# Patient Record
Sex: Male | Born: 1940 | Race: White | Hispanic: No | Marital: Single | Smoking: Never smoker
Health system: Southern US, Community
[De-identification: ages and names within clinical notes are randomized; demographics above are authoritative.]

## PROBLEM LIST (undated history)

## (undated) DIAGNOSIS — R519 Headache, unspecified: Secondary | ICD-10-CM

## (undated) DIAGNOSIS — R339 Retention of urine, unspecified: Secondary | ICD-10-CM

## (undated) DIAGNOSIS — N3281 Overactive bladder: Secondary | ICD-10-CM

## (undated) DIAGNOSIS — N319 Neuromuscular dysfunction of bladder, unspecified: Secondary | ICD-10-CM

## (undated) DIAGNOSIS — R1310 Dysphagia, unspecified: Secondary | ICD-10-CM

## (undated) DIAGNOSIS — R32 Unspecified urinary incontinence: Secondary | ICD-10-CM

## (undated) DIAGNOSIS — J449 Chronic obstructive pulmonary disease, unspecified: Secondary | ICD-10-CM

## (undated) DIAGNOSIS — F329 Major depressive disorder, single episode, unspecified: Secondary | ICD-10-CM

## (undated) DIAGNOSIS — F32A Depression, unspecified: Secondary | ICD-10-CM

## (undated) DIAGNOSIS — E039 Hypothyroidism, unspecified: Secondary | ICD-10-CM

## (undated) DIAGNOSIS — N400 Enlarged prostate without lower urinary tract symptoms: Secondary | ICD-10-CM

## (undated) DIAGNOSIS — E46 Unspecified protein-calorie malnutrition: Secondary | ICD-10-CM

## (undated) DIAGNOSIS — S73035A Other anterior dislocation of left hip, initial encounter: Secondary | ICD-10-CM

## (undated) DIAGNOSIS — K5900 Constipation, unspecified: Secondary | ICD-10-CM

## (undated) DIAGNOSIS — A809 Acute poliomyelitis, unspecified: Secondary | ICD-10-CM

## (undated) DIAGNOSIS — K219 Gastro-esophageal reflux disease without esophagitis: Secondary | ICD-10-CM

## (undated) DIAGNOSIS — E876 Hypokalemia: Secondary | ICD-10-CM

## (undated) DIAGNOSIS — R51 Headache: Secondary | ICD-10-CM

## (undated) DIAGNOSIS — E785 Hyperlipidemia, unspecified: Secondary | ICD-10-CM

## (undated) DIAGNOSIS — Z9109 Other allergy status, other than to drugs and biological substances: Secondary | ICD-10-CM

## (undated) DIAGNOSIS — F419 Anxiety disorder, unspecified: Secondary | ICD-10-CM

## (undated) DIAGNOSIS — F79 Unspecified intellectual disabilities: Secondary | ICD-10-CM

## (undated) DIAGNOSIS — G9340 Encephalopathy, unspecified: Secondary | ICD-10-CM

## (undated) DIAGNOSIS — E559 Vitamin D deficiency, unspecified: Secondary | ICD-10-CM

## (undated) DIAGNOSIS — J309 Allergic rhinitis, unspecified: Secondary | ICD-10-CM

## (undated) DIAGNOSIS — D509 Iron deficiency anemia, unspecified: Secondary | ICD-10-CM

## (undated) DIAGNOSIS — Z96642 Presence of left artificial hip joint: Secondary | ICD-10-CM

## (undated) HISTORY — PX: CHOLECYSTECTOMY: SHX55

## (undated) HISTORY — PX: JOINT REPLACEMENT: SHX530

## (undated) HISTORY — PX: WOUND DEBRIDEMENT: SHX247

---

## 2002-01-29 ENCOUNTER — Ambulatory Visit (HOSPITAL_COMMUNITY): Admission: RE | Admit: 2002-01-29 | Discharge: 2002-01-29 | Payer: Self-pay | Admitting: Nephrology

## 2003-02-09 ENCOUNTER — Emergency Department (HOSPITAL_COMMUNITY): Admission: EM | Admit: 2003-02-09 | Discharge: 2003-02-09 | Payer: Self-pay | Admitting: Emergency Medicine

## 2006-09-25 ENCOUNTER — Ambulatory Visit (HOSPITAL_COMMUNITY): Admission: RE | Admit: 2006-09-25 | Discharge: 2006-09-25 | Payer: Self-pay | Admitting: Family Medicine

## 2007-12-10 ENCOUNTER — Emergency Department (HOSPITAL_COMMUNITY): Admission: EM | Admit: 2007-12-10 | Discharge: 2007-12-10 | Payer: Self-pay | Admitting: Emergency Medicine

## 2008-04-12 ENCOUNTER — Ambulatory Visit (HOSPITAL_COMMUNITY): Admission: RE | Admit: 2008-04-12 | Discharge: 2008-04-12 | Payer: Self-pay | Admitting: Family Medicine

## 2008-04-12 ENCOUNTER — Encounter: Payer: Self-pay | Admitting: Orthopedic Surgery

## 2008-05-21 ENCOUNTER — Ambulatory Visit (HOSPITAL_COMMUNITY): Admission: RE | Admit: 2008-05-21 | Discharge: 2008-05-21 | Payer: Self-pay | Admitting: Family Medicine

## 2008-06-17 ENCOUNTER — Ambulatory Visit: Payer: Self-pay | Admitting: Orthopedic Surgery

## 2008-06-17 ENCOUNTER — Ambulatory Visit (HOSPITAL_COMMUNITY): Admission: RE | Admit: 2008-06-17 | Discharge: 2008-06-17 | Payer: Self-pay | Admitting: Orthopedic Surgery

## 2008-06-17 DIAGNOSIS — M169 Osteoarthritis of hip, unspecified: Secondary | ICD-10-CM | POA: Insufficient documentation

## 2008-06-17 DIAGNOSIS — M25559 Pain in unspecified hip: Secondary | ICD-10-CM | POA: Insufficient documentation

## 2008-06-17 DIAGNOSIS — M1610 Unilateral primary osteoarthritis, unspecified hip: Secondary | ICD-10-CM | POA: Insufficient documentation

## 2008-06-30 ENCOUNTER — Encounter: Payer: Self-pay | Admitting: Orthopedic Surgery

## 2008-07-08 ENCOUNTER — Encounter: Payer: Self-pay | Admitting: Orthopedic Surgery

## 2008-08-11 ENCOUNTER — Encounter (HOSPITAL_COMMUNITY): Admission: RE | Admit: 2008-08-11 | Discharge: 2008-09-10 | Payer: Self-pay | Admitting: Family Medicine

## 2009-04-13 ENCOUNTER — Encounter: Payer: Self-pay | Admitting: Orthopedic Surgery

## 2009-04-13 ENCOUNTER — Ambulatory Visit (HOSPITAL_COMMUNITY): Admission: RE | Admit: 2009-04-13 | Discharge: 2009-04-13 | Payer: Self-pay | Admitting: Family Medicine

## 2009-05-21 ENCOUNTER — Encounter: Payer: Self-pay | Admitting: Orthopedic Surgery

## 2009-05-25 ENCOUNTER — Encounter: Payer: Self-pay | Admitting: Orthopedic Surgery

## 2009-05-31 ENCOUNTER — Ambulatory Visit: Payer: Self-pay | Admitting: Orthopedic Surgery

## 2009-05-31 DIAGNOSIS — M4800 Spinal stenosis, site unspecified: Secondary | ICD-10-CM

## 2009-05-31 DIAGNOSIS — M1710 Unilateral primary osteoarthritis, unspecified knee: Secondary | ICD-10-CM

## 2009-05-31 DIAGNOSIS — M1610 Unilateral primary osteoarthritis, unspecified hip: Secondary | ICD-10-CM | POA: Insufficient documentation

## 2009-06-01 ENCOUNTER — Encounter: Payer: Self-pay | Admitting: Orthopedic Surgery

## 2009-06-02 ENCOUNTER — Telehealth: Payer: Self-pay | Admitting: Orthopedic Surgery

## 2009-06-07 ENCOUNTER — Telehealth: Payer: Self-pay | Admitting: Orthopedic Surgery

## 2009-07-15 ENCOUNTER — Encounter: Payer: Self-pay | Admitting: Orthopedic Surgery

## 2009-07-19 ENCOUNTER — Ambulatory Visit: Payer: Self-pay | Admitting: Orthopedic Surgery

## 2009-07-22 ENCOUNTER — Telehealth: Payer: Self-pay | Admitting: Orthopedic Surgery

## 2009-09-30 ENCOUNTER — Observation Stay (HOSPITAL_COMMUNITY): Admission: EM | Admit: 2009-09-30 | Discharge: 2009-10-04 | Payer: Self-pay | Admitting: Emergency Medicine

## 2010-02-14 ENCOUNTER — Telehealth: Payer: Self-pay | Admitting: Orthopedic Surgery

## 2010-04-09 ENCOUNTER — Encounter: Payer: Self-pay | Admitting: Family Medicine

## 2010-04-17 ENCOUNTER — Encounter: Payer: Self-pay | Admitting: Orthopedic Surgery

## 2010-04-18 NOTE — Progress Notes (Signed)
Summary: Referral to Dr. Ace Gins.  Phone Note Outgoing Call   Call placed by: Santo Held,  June 02, 2009 2:40 PM Call placed to: Specialist Action Taken: Information Sent Summary of Call: I faxed a referral for this patient to Dr. Ace Gins for ESI injections at L4-5.

## 2010-04-18 NOTE — Letter (Signed)
Summary: *Orthopedic Consult Note  [facility]  [address] [place] [address]  [place], [place] [address]   Phone: [phone]  Fax: [phone]    Re:    Alexander Hanna DOB:    1940/11/28   Dear: Dr. Lorriane Shire   Thank you for requesting that we see the above patient for consultation.  A copy of the detailed office note will be sent under separate cover, for your review.  Evaluation today is consistent with:  1)  SPINAL STENOSIS (ICD-724.00) 2)  ARTHRITIS, RIGHT HIP (ICD-716.95) 3)  KNEE, ARTHRITIS, DEGEN./OSTEO [id number])   [name] has arthritis of the knee hip and spinal stenosis.  The spinal stenosis is causing the giving way symptoms of the RIGHT leg.  The RIGHT hip and RIGHT knee are stable and did not need any surgical treatment at this time.  Although his been well cared for his mental capacity prevents him from being a surgical candidate for hip or knee replacement in the future.  He has been sent for epidural injections for the spine.      Thank you for this opportunity to look after your patient.  Sincerely,   Demetrius Revel. MD.

## 2010-04-18 NOTE — Assessment & Plan Note (Signed)
Summary: hip pain needs xr/medicare/medicaid/[place]ie[place]/bsf   Vital Signs:  Patient profile:   70 year old male Height:      64 inches Weight:      221 pounds Pulse rate:   82 / minute Resp:     18 per minute  Visit Type:  Initial Consult Referring Provider:  Dr. Cindie Laroche Primary Provider:  Dr. Cindie Laroche  CC:  right hip pain.  History of Present Illness: Alexander Hanna is 70 years old history of giving out symptoms of the RIGHT lower extremity is referred for RIGHT hip pain but his symptoms are that his leg gives out when his walking he's had frequent falls.  We have an MRI from January 6 of 2011 and he has spinal stenosis is significant at L4 and 5 and L5 and S1 we also have x-rays from 06/17/08 osteoarthritis RIGHT hip, moderate.  We also have x-ray 04/12/08 and minimal arthritis RIGHT knee.  He had an MRI during the same time and that showed no intra-articular pathology other than some mild arthritic changes with chondromalacia.  He does have some stiffness and pain in the RIGHT knee it is mild pain is medial.  He denies hip pain he has trouble getting up from a chair.  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   Meds: Avodart, Flomax, Levoxyl, Naproxen, Simcor, Zetia, Loratadine, Fluticasone.    Allergies: No Known Drug Allergies  Past History:  Past Medical History: brain damage, had polio at 70 yrs old. seasonal allergies thyroid cholesterol hard of hearing tunnel vision htn  Family History: FH of Cancer:  Family History Coronary Heart Disease male < 20 Family History of Arthritis Hx, family, chronic respiratory condition Family History Coronary Heart Disease male < 102  Social History: Patient is single.  no job n smoking no alcohol 3 cups of coffee  Review of Systems Constitutional:  Denies weight loss, weight gain, fever, chills, and fatigue. Cardiovascular:  Denies chest pain, palpitations,  fainting, and murmurs. Respiratory:  Complains of short of breath and snoring; denies wheezing, couch, tightness, pain on inspiration, and snoring . Gastrointestinal:  Denies heartburn, nausea, vomiting, diarrhea, constipation, and blood in your stools. Genitourinary:  Complains of frequency, urgency, and difficulty urinating; denies painful urination, flank pain, and bleeding in urine. Neurologic:  Complains of unsteady gait and dizziness; denies numbness, tingling, tremors, and seizure. Musculoskeletal:  Complains of joint pain, instability, and stiffness; denies swelling, redness, heat, and muscle pain. Endocrine:  Denies excessive thirst, exessive urination, and heat or cold intolerance. Psychiatric:  Complains of nervousness and anxiety; denies depression and hallucinations. Skin:  Denies changes in the skin, poor healing, rash, itching, and redness. HEENT:  Complains of blurred or double vision; denies eye pain, redness, and watering; headache, difficult swallowing, ears ringing, tunnel vision. Immunology:  Complains of seasonal allergies; denies sinus problems. Hemoatologic:  Denies easy bleeding and brusing.  Physical Exam  Additional Exam:  vital signs are as recorded  His overall appearance is normal.  He uses a cane to ambulate.  Peripheral vascular system reveals no swelling normal pulses and temperature  Lymph node exams deferred  He walks with a cane as stated he has leg length discrepancy LEFT longer than RIGHT  Inspection reveals tenderness on the medial joint line it is mild is no effusion his range of motion 125 with no contracture, mild crepitation  The joint is assessed a stable and the muscle strength and  tone is assessed as normal, meniscal signs are negative,  RIGHT hip flexion normal no pain no pain with internal rotation  Skin RIGHT leg normal  Reflexes remain intact has normal soft touch sensation to the RIGHT leg he is oriented to time person place but he  does have a mental deficiency and is here with her caregiver who spent very diligent about his care.     Impression & Recommendations: 1. he has osteoarthritis of the knee mild  2.  he has moderate RIGHT hip arthritis  3.  he has degenerative disc disease with spinal stenosis of the RIGHT lower extremity which is the primary cause of the leg giving way.  I do not think is a surgical candidate.  I discussed this at length with his caregiver.  I recommended an epidural injection.  We talked about spine referral but I doubt he would be an operative candidate.  He is not an operative candidate for his mild knee arthritis or his moderate hip arthritis  Other Orders: Est. Patient Level IV [id number])  Patient Instructions: 1)  ESI injections at L4-5 2)  Call us after the 2nd injection

## 2010-04-18 NOTE — Progress Notes (Signed)
Summary: medical record request per patient POA  Phone Note Call from Patient   Caller: Patient's brother Summary of Call: Patient's brother,POA, Alexander Hanna, relates that the second opinion referral has never been scheduled at [facility].  We had followed up previously.  He now requests records to be directly faxed to attn of Dr Margot Chimes per Dr Aline Brochure and per Dr Cindie Laroche.  Authorization has been signed.    Records faxed as requested to # [phone] Initial call taken by: Ihor Austin,  February 14, 2010 4:16 PM

## 2010-04-18 NOTE — Progress Notes (Signed)
Summary: Referral to [facility].  Phone Note Outgoing Call   Call placed by: Santo Held,  Jul 22, 2009 10:22 AM Call placed to: Specialist Action Taken: Information Sent Summary of Call: Referral to [facility] for hip replacement to see Dr. Alvan Dame or Dr. Maureen Ralphs.

## 2010-04-18 NOTE — Progress Notes (Signed)
Summary: Appointment with Dr. Ace Gins.  Phone Note From Other Clinic   Caller: Referral Coordinator Reason for Call: Schedule Patient Appt Summary of Call: Patient has an appointment with Dr. Ace Gins on 06-09-09 at 2:30. Patient is aware of this appointment. Initial call taken by: Santo Held,  June 07, 2009 8:35 AM

## 2010-04-18 NOTE — Letter (Signed)
Summary: Historic Patient File  Historic Patient File   Imported By: Jeanelle Malling 06/03/2009 11:12:45  _____________________________________________________________________  External Attachment:    Type:   Image     Comment:   history form

## 2010-04-18 NOTE — Letter (Signed)
Summary: ESI referral order  ESI referral order   Imported By: Ihor Austin 07/07/2009 [id number]  _____________________________________________________________________  External Attachment:    Type:   Image     Comment:   External Document

## 2010-04-18 NOTE — Assessment & Plan Note (Signed)
Summary: reck after 2nd injection/medicaare/bsf   Visit Type:  Follow-up Referring Provider:  Dr. Cindie Laroche Primary Provider:  Dr. Cindie Laroche  CC:  back pain.  History of Present Illness: 70 year old male comes in for followup after his second epidural injection.  He has degenerative disc disease in his lumbar spine, osteoarthritis of the RIGHT hip moderate, osteoarthritis RIGHT knee mild.  Imaging includes MRI RIGHT knee  MRI lumbar spine X-ray RIGHT hip X-ray RIGHT knee  His major problem is that his RIGHT leg goes out.  He has minimal hip pain and groin pain,no  thigh pain, mild knee pain as well.  He also has back pain.  He has gotten some relief from the overall RIGHT lower extremity pain but he still has weakness and giving out of the RIGHT leg. he also has difficulty getting out of a chair.       Allergies: No Known Drug Allergies   Impression & Recommendations:  Problem # 1:  SPINAL STENOSIS (ICD-724.00)  This is a very difficult call here.  I do not think he is a candidate for spine surgery although he continues to fall.  He does not have major hip or groin pain.  He has some knee pain but his x-rays are not very significant for major arthritis and his MRI shows no intra-articular lesion in the meniscus or the ligament.  On exam his knee is stable.  I would like a second opinion regarding hip replacement surgery.  Orders: Orthopedic Surgeon Referral (Ortho Surgeon) Est. Patient Level III [phone])  Problem # 2:  ARTHRITIS, RIGHT HIP [id number])  Orders: Orthopedic Surgeon Referral (Ortho Surgeon) Est. Patient Level III [id number])  Problem # 3:  KNEE, ARTHRITIS, DEGEN./OSTEO (ICD-715.96)  His updated medication list for this problem includes:    Adult Aspirin Ec Low Strength 81 Mg Tbec (Aspirin)    Naprosyn 500 Mg Tabs (Naproxen) ..... One by mouth bid    Norco 5-325 Mg Tabs (Hydrocodone-acetaminophen) .Marland Kitchen... 1 by mouth q 4prn pain  Orders: Est. Patient Level  III [id number])  Medications Added to Medication List This Visit: 1)  Norco 5-325 Mg Tabs (Hydrocodone-acetaminophen) .Marland Kitchen.. 1 by mouth q 4prn pain  Patient Instructions: 1)  PROCEED WITH 3RD INJECTION  2)  MILD PAIN RELIEVER 3)  CONSULT DR ALUSIO OR OLIN FOR HIP REPLACEMENT  4)  f/u after consult  Prescriptions: NORCO 5-325 MG TABS (HYDROCODONE-ACETAMINOPHEN) 1 by mouth q 4prn pain  #42 x 2   Entered and Authorized by:   Arther Abbott MD   Signed by:   Arther Abbott MD on 07/19/2009   Method used:   Print then Give to Patient   RxID:   [id number]   Appended Document: reck after 2nd injection/medicaare/bsf    Clinical Lists Changes

## 2010-04-26 NOTE — Consult Note (Signed)
Summary: Consult report from Dr. Paralee Cancel  Consult report from Dr. Paralee Cancel   Imported By: Ruffin Pyo 04/17/2010 13:32:26  _____________________________________________________________________  External Attachment:    Type:   Image     Comment:   External Document

## 2010-06-03 LAB — BASIC METABOLIC PANEL
BUN: 11 mg/dL (ref 6–23)
BUN: 11 mg/dL (ref 6–23)
BUN: 12 mg/dL (ref 6–23)
CO2: 25 mEq/L (ref 19–32)
CO2: 26 mEq/L (ref 19–32)
CO2: 28 mEq/L (ref 19–32)
Calcium: 9.1 mg/dL (ref 8.4–10.5)
Chloride: 103 mEq/L (ref 96–112)
Chloride: 104 mEq/L (ref 96–112)
Chloride: 105 mEq/L (ref 96–112)
Chloride: 106 mEq/L (ref 96–112)
Creatinine, Ser: 0.75 mg/dL (ref 0.4–1.5)
Creatinine, Ser: 0.79 mg/dL (ref 0.4–1.5)
GFR calc Af Amer: >60 mL/min (ref >60)
GFR calc Af Amer: >60 mL/min (ref >60)
GFR calc Af Amer: >60 mL/min (ref >60)
GFR calc Af Amer: >60 mL/min (ref >60)
GFR calc non Af Amer: >60 mL/min (ref >60)
GFR calc non Af Amer: >60 mL/min (ref >60)
GFR calc non Af Amer: >60 mL/min (ref >60)
Glucose, Bld: 100 mg/dL — ABNORMAL HIGH (ref 70–99)
Glucose, Bld: 96 mg/dL (ref 70–99)
Potassium: 3.7 mEq/L (ref 3.5–5.1)
Sodium: 135 mEq/L (ref 135–145)

## 2010-06-03 LAB — DIFFERENTIAL
Basophils Absolute: 0 10*3/uL (ref 0.0–0.1)
Basophils Relative: 0 % (ref 0–1)
Basophils Relative: 1 % (ref 0–1)
Eosinophils Absolute: 0.1 10*3/uL (ref 0.0–0.7)
Eosinophils Absolute: 0.3 10*3/uL (ref 0.0–0.7)
Eosinophils Relative: 2 % (ref 0–5)
Eosinophils Relative: 3 % (ref 0–5)
Lymphocytes Relative: 22 % (ref 12–46)
Lymphocytes Relative: 24 % (ref 12–46)
Lymphs Abs: 1.9 10*3/uL (ref 0.7–4.0)
Monocytes Absolute: 0.6 10*3/uL (ref 0.1–1.0)
Monocytes Absolute: 0.7 10*3/uL (ref 0.1–1.0)
Monocytes Absolute: 0.9 10*3/uL (ref 0.1–1.0)
Monocytes Relative: 10 % (ref 3–12)
Monocytes Relative: 9 % (ref 3–12)
Neutro Abs: 3.7 10*3/uL (ref 1.7–7.7)
Neutro Abs: 3.9 10*3/uL (ref 1.7–7.7)
Neutro Abs: 4.7 10*3/uL (ref 1.7–7.7)
Neutro Abs: 6.3 10*3/uL (ref 1.7–7.7)
Neutrophils Relative %: 59 % (ref 43–77)
Neutrophils Relative %: 59 % (ref 43–77)
Neutrophils Relative %: 66 % (ref 43–77)

## 2010-06-03 LAB — CBC
HCT: 38.7 % — ABNORMAL LOW (ref 39.0–52.0)
HCT: 40.3 % (ref 39.0–52.0)
Hemoglobin: 13.4 g/dL (ref 13.0–17.0)
Hemoglobin: 13.7 g/dL (ref 13.0–17.0)
MCH: 30.7 pg (ref 26.0–34.0)
MCH: 30.9 pg (ref 26.0–34.0)
MCH: 31.1 pg (ref 26.0–34.0)
MCHC: 34 g/dL (ref 30.0–36.0)
MCV: 90.3 fL (ref 78.0–100.0)
MCV: 90.6 fL (ref 78.0–100.0)
Platelets: 234 10*3/uL (ref 150–400)
Platelets: 241 10*3/uL (ref 150–400)
Platelets: 255 10*3/uL (ref 150–400)
RBC: 4.28 MIL/uL (ref 4.22–5.81)
RBC: 4.3 MIL/uL (ref 4.22–5.81)
RDW: 14.3 % (ref 11.5–15.5)
RDW: 14.5 % (ref 11.5–15.5)
WBC: 9.6 10*3/uL (ref 4.0–10.5)

## 2010-06-03 LAB — VANCOMYCIN, TROUGH: Vancomycin Tr: 13.9 ug/mL (ref 10.0–20.0)

## 2010-06-07 ENCOUNTER — Other Ambulatory Visit: Payer: Self-pay | Admitting: Orthopedic Surgery

## 2010-06-07 ENCOUNTER — Encounter (HOSPITAL_COMMUNITY): Payer: Medicare Other

## 2010-06-07 LAB — BASIC METABOLIC PANEL
BUN: 9 mg/dL (ref 6–23)
CO2: 26 mEq/L (ref 19–32)
Calcium: 9.4 mg/dL (ref 8.4–10.5)
Chloride: 105 mEq/L (ref 96–112)
GFR calc non Af Amer: >60 mL/min (ref >60)
Sodium: 139 mEq/L (ref 135–145)

## 2010-06-07 LAB — SURGICAL PCR SCREEN
MRSA, PCR: NEGATIVE
Staphylococcus aureus: POSITIVE — AB

## 2010-06-07 LAB — DIFFERENTIAL
Basophils Absolute: 0 10*3/uL (ref 0.0–0.1)
Lymphs Abs: 1.8 10*3/uL (ref 0.7–4.0)
Monocytes Relative: 12 % (ref 3–12)

## 2010-06-07 LAB — URINALYSIS, ROUTINE W REFLEX MICROSCOPIC
Bilirubin Urine: NEGATIVE
Glucose, UA: NEGATIVE mg/dL
Nitrite: NEGATIVE

## 2010-06-07 LAB — APTT: aPTT: 35 seconds (ref 24–37)

## 2010-06-07 LAB — CBC
Platelets: 193 10*3/uL (ref 150–400)
WBC: 6.4 10*3/uL (ref 4.0–10.5)

## 2010-06-13 ENCOUNTER — Inpatient Hospital Stay (HOSPITAL_COMMUNITY): Payer: Medicare Other

## 2010-06-13 ENCOUNTER — Inpatient Hospital Stay (HOSPITAL_COMMUNITY)
Admission: RE | Admit: 2010-06-13 | Discharge: 2010-06-19 | DRG: 470 | Disposition: A | Discharge status: Skilled Nursing Facility | Payer: Medicare Other | Source: Ambulatory Visit | Attending: Orthopedic Surgery | Admitting: Orthopedic Surgery

## 2010-06-13 DIAGNOSIS — Z01812 Encounter for preprocedural laboratory examination: Secondary | ICD-10-CM

## 2010-06-13 DIAGNOSIS — M1610 Unilateral primary osteoarthritis, unspecified hip: Principal | ICD-10-CM | POA: Diagnosis present

## 2010-06-13 DIAGNOSIS — F09 Unspecified mental disorder due to known physiological condition: Secondary | ICD-10-CM | POA: Diagnosis present

## 2010-06-13 DIAGNOSIS — N401 Enlarged prostate with lower urinary tract symptoms: Secondary | ICD-10-CM | POA: Diagnosis present

## 2010-06-13 DIAGNOSIS — D649 Anemia, unspecified: Secondary | ICD-10-CM | POA: Diagnosis not present

## 2010-06-13 DIAGNOSIS — E039 Hypothyroidism, unspecified: Secondary | ICD-10-CM | POA: Diagnosis present

## 2010-06-13 DIAGNOSIS — Z8612 Personal history of poliomyelitis: Secondary | ICD-10-CM

## 2010-06-13 DIAGNOSIS — R350 Frequency of micturition: Secondary | ICD-10-CM | POA: Diagnosis not present

## 2010-06-13 DIAGNOSIS — Z87820 Personal history of traumatic brain injury: Secondary | ICD-10-CM

## 2010-06-13 DIAGNOSIS — M169 Osteoarthritis of hip, unspecified: Principal | ICD-10-CM | POA: Diagnosis present

## 2010-06-13 DIAGNOSIS — I10 Essential (primary) hypertension: Secondary | ICD-10-CM | POA: Diagnosis present

## 2010-06-13 DIAGNOSIS — N138 Other obstructive and reflux uropathy: Secondary | ICD-10-CM | POA: Diagnosis present

## 2010-06-13 LAB — TYPE AND SCREEN

## 2010-06-14 LAB — BASIC METABOLIC PANEL
BUN: 9 mg/dL (ref 6–23)
Calcium: 7.9 mg/dL — ABNORMAL LOW (ref 8.4–10.5)
GFR calc non Af Amer: >60 mL/min (ref >60)
Glucose, Bld: 139 mg/dL — ABNORMAL HIGH (ref 70–99)

## 2010-06-14 LAB — CBC
HCT: 27.5 % — ABNORMAL LOW (ref 39.0–52.0)
MCHC: 33.1 g/dL (ref 30.0–36.0)
MCV: 88.4 fL (ref 78.0–100.0)
RDW: 13.5 % (ref 11.5–15.5)

## 2010-06-15 LAB — CBC
HCT: 25.7 % — ABNORMAL LOW (ref 39.0–52.0)
MCH: 29.2 pg (ref 26.0–34.0)
MCHC: 32.7 g/dL (ref 30.0–36.0)
RDW: 14.1 % (ref 11.5–15.5)

## 2010-06-15 LAB — BASIC METABOLIC PANEL
BUN: 11 mg/dL (ref 6–23)
CO2: 29 mEq/L (ref 19–32)
Calcium: 7.9 mg/dL — ABNORMAL LOW (ref 8.4–10.5)
Chloride: 102 mEq/L (ref 96–112)
Creatinine, Ser: 0.77 mg/dL (ref 0.4–1.5)
GFR calc Af Amer: >60 mL/min (ref >60)
GFR calc non Af Amer: >60 mL/min (ref >60)
Glucose, Bld: 109 mg/dL — ABNORMAL HIGH (ref 70–99)
Potassium: 4.5 mEq/L (ref 3.5–5.1)
Sodium: 135 mEq/L (ref 135–145)

## 2010-06-15 NOTE — Op Note (Signed)
NAME:  Alexander Hanna, Alexander Hanna               ACCOUNT NO.:  [id number]  MEDICAL RECORD NO.:  [id number]           PATIENT TYPE:  I  LOCATION:  0004                         FACILITY:  [facility]  PHYSICIAN:  Pietro Cassis. Alvan Dame, M.D.  DATE OF BIRTH:  07-09-1940  DATE OF PROCEDURE: DATE OF DISCHARGE:                              OPERATIVE REPORT   PREOPERATIVE DIAGNOSIS:  Right hip osteoarthritis.  POSTOPERATIVE DIAGNOSIS:  Right hip osteoarthritis.  PROCEDURE:  Right total hip replacement, utilizing DePuy component, size 56 pinnacle cup, 36 neutral Ultrex Liner, size 8 high trial lock stem with 36+5 Delta ceramic ball.  SURGEON:  Pietro Cassis. Alvan Dame, M.D.  ASSISTANT:  Judith Part. Chabon, P.A.  ANESTHESIA:  General.  COMPLICATIONS:  None.  SPECIMENS:  None.  DRAINS:  One Hemovac.  BLOOD LOSS:  About 1500 mL.  INDICATIONS FOR PROCEDURE:  Mr. Makely is a 70 year old gentleman with advanced right hip osteoarthritis, seen and evaluated in the office. The patient did have some cognitive deficits but has been cared for with his brother.  After reviewing risks and benefits, hip replacement was scheduled after specific risks of infection, DVT, component failure, dislocation, consent was obtained for benefit of pain relief.  PROCEDURE IN DETAIL:  The patient was brought to operative theater. Once adequate anesthesia, preoperative antibiotics, Ancef administered, the patient was positioned supine on the [employer] table.  Once bony prominences were padded and positioned, fluoroscopy was used to identify and confirm positioning.  The right lower extremity was then prepped and draped in sterile fashion with shower curtain technique.  Time-out was performed, identifying the patient, planned procedure in the extremity.  An incision was made lateral to the anterior-superior iliac spine over the anterior aspect of the trochanter.  The tensor fascia lata was identified and soft tissue planes created and the  protractor placed. The fascia was incised, the muscles swept laterally and retractor placed superiorly.  The anterior circumflex vessels were then cauterized and pericapsular fat removed and the inferior retractor placed.  An L capsulotomy was then made just beneath the rectus from the superior neck to the trochanteric fossa and into the lesser trochanteric region.  Stay sutures were placed and a retractor was placed intracapsular.  At this point, the fluoroscopy was used to confirm location of the neck cut.  The neck osteotomy was made and the patient was having relatively shorter neck with significant arthritic changes noted.  Following the neck osteotomy, the femoral head was removed.  At this point, traction was taken off the leg and anterior retractor placed along the rim of the acetabulum as well as one posteriorly.  The labrum and foveal tissue was debrided.  There is slight medial wall protrusio and significant foveal tissue removed.  I began reaming with a 48 reamer.  Then, we reamed up to 50 to 52, then up to 55 reamer.  56 pinnacle cup was chosen.  There were some cystic changes within the acetabulum which were curetted out and packed with autograft from the femoral head.  56 pinnacle cup was then impacted under fluoroscopic confirmation at about 15 degrees of anteversion and 35  to 40 degrees of abduction.  Single cancellous screw was placed, hole eliminator placed and final 36+0 neutral Ultrex Liner.  At this point, the femur was rotated to about 80 degrees that released more inferior capsule and then over to 100 degrees.  I placed a medial retractor.  The hook had been placed in the lateral vastus lateralis to help with elevation of femur.  At this point, the retractor was placed posteriorly and posterior capsular release was carried out in the minimal fashion.  I used a box osteotome to open up the proximal femur, then hand broach with a starting broach and then  confirmed dislocation AP and lateral planes.  Following this, I began broaching and broached up initially to a size 6 broach, at which point I did a trial reduction with 6 high offset neck.  I felt that the hip stability was good; however, I was still linearly a little short.  The trial components removed and I broached up to a size 8 which now sat a few millimeters part of my neck cut and I chose 8 high trial lock stem.  The 8 stem was impacted and sat a level of broach and after doing trial reduction, chose a 36+5 Delta ceramic ball.  With this, we were able to restore a lot of his leg lengths.  His hip remained stable.  Final 36 Delta ceramic ball was impacted on clean and dry trunnion and the hip reduced.  The hip has been irrigated throughout the case.  Again at this point, I reapproximated the anterior capsule to itself using #1 Vicryl.  A medium Hemovac drain was placed deep to the tensor fascia and extracapsular.  The tensor fascia lata was then reapproximated using #1 Vicryl.  The remainder of the wound was closed with 2-0 Vicryl and running 4-0 Monocryl.  The hip was cleaned, dried, dressed sterilely with Aquacel.  Drain site dressed separately.  He was then extubated and brought to recovery room in stable condition, tolerating the procedure well.     Pietro Cassis Alvan Dame, M.D.    MDO/MEDQ  D:  06/13/2010  T:  06/13/2010  Job:  [id number]  Electronically Signed by Paralee Cancel M.D. on 06/15/2010 05:32:35 AM

## 2010-06-16 NOTE — Discharge Summary (Signed)
NAME:  Alexander Hanna, Alexander Hanna               ACCOUNT NO.:  [id number]  MEDICAL RECORD NO.:  [id number]           PATIENT TYPE:  I  LOCATION:  [id number]                         FACILITY:  [facility]  PHYSICIAN:  Pietro Cassis. Alvan Dame, M.D.  DATE OF BIRTH:  03-12-1941  DATE OF ADMISSION:  06/13/2010 DATE OF DISCHARGE:  06/16/2010                        DISCHARGE SUMMARY - REFERRING   ADMITTING DIAGNOSIS:  Right hip osteoarthritis.  DISCHARGE DIAGNOSES: 1. Right hip osteoarthritis, status post right total hip replacement     on June 13, 2010. 2. 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 3. History of polio. 4. Hyperthyroidism. 5. Hypertension. 6. Prostate issues, benign prostatic hypertrophy.  BRIEF HISTORY:  Mr. Leben is a 70 year old male who presented to the office with his brother who cares for him for advanced right hip osteoarthritis.  Radiographically, he had significant complaints of pain and functional deficits based on this.  After reviewing with his brother his current situation in an effort to provide some pain control and pain relief to Mr. Niska, a discussion was reviewed and planned for right total hip replacement.  Risks and benefits were discussed with his brother who acts as power of attorney and consent obtained for it.  He was not a candidate for tranexamic acid.  HOSPITAL COURSE:  The patient was admitted for same-day surgery on June 13, 2010.  He underwent a right total hip replacement through an anterior approach.  He tolerated the procedure well without complication.  Please see dictated operative note for full details of the procedure.  Postoperatively, after routine stay in the recovery room, he was transferred to the orthopedic ward where he remained for his hospital stay.  Postoperative day #1, he had his Foley removed as well as his Hemovac drain.  He was seen and evaluated by Physical Therapy and was partial weightbearing based on proximal bone  stock.  He was noted to have an inappropriate drop for the procedure down to hematocrit of 27.9 of postoperative day #1.  Postop day #2, it remained relatively stable at 25.7.  He did not require any transfusions.  His vital signs remained stable.  His electrolytes, particularly his creatinine remained stable.  His wound was dry.  He tolerated regular diet.  On postoperative day #3, he was medically ready for discharge.  Only issue in the hospital was frequent urination related to his use of medicines for his prostate issues most likely as well as perhaps urethral irritation.  There were no fevers or chills that would indicate concern for urinary tract infection.  DISCHARGE INSTRUCTIONS:  The patient will be discharged to nursing facility when the bed is available.  He will be seen and evaluated by Physical Therapy.  His instructions will be partial weightbearing 50% on the right lower extremity and limited torsion movement of his right lower extremity until I can get his bone to heal and which may require 4- 6 weeks.  He will be on a regular diet.  His right hip dressing should be changed on June 22, 2010.  Following that, dry gauze can be applied to it daily to prevent irritation from clothes.  Mr. Wind is certain to see Dr. Paralee Cancel at [facility] at [phone] in 2 weeks' time.  If there are any orthopedic concerns or wound concerns, they can be addressed to our office.  DISCHARGE MEDICATIONS: 1. Colace 100 mg p.o. b.i.d. for constipation while on pain medicine. 2. MiraLax 17 g p.o. daily while on pain medicine for constipation. 3. Iron 325 mg 2-3 times a day as tolerated for 2-3 weeks. 4. Hydrocodone 7.5/325 one to two tablets every 4-6 hours as needed     for pain. 5. Robaxin 500 mg p.o. q.6h. as needed for muscle spasm and pain. 6. Avodart 0.5 mg p.o. at bedtime. 7. Fluticasone nasal spray daily as needed. 8. Levoxyl 112 mcg p.o. q.a.m. 9. Loratadine  10 mg daily as needed. 10.Rapaflo 8 mg q.a.m. 11.Simcor 500/20 at bedtime. 12.Toviaz 8 mg at bedtime. 13.Zetia 10 mg at bedtime. 14.In addition, he will be on aspirin 325 mg p.o. b.i.d. for 30 days.     Following this, he will resume his aspirin 81 mg dose.     Pietro Cassis Alvan Dame, M.D.     MDO/MEDQ  D:  06/16/2010  T:  06/16/2010  Job:  [id number]  Electronically Signed by Paralee Cancel M.D. on 06/16/2010 08:36:20 PM

## 2010-06-19 ENCOUNTER — Inpatient Hospital Stay
Admission: RE | Admit: 2010-06-19 | Discharge: 2010-07-08 | Disposition: A | Discharge status: Home / Self Care | Payer: Self-pay | Source: Ambulatory Visit | Attending: Internal Medicine | Admitting: Internal Medicine

## 2010-06-19 DIAGNOSIS — I82409 Acute embolism and thrombosis of unspecified deep veins of unspecified lower extremity: Principal | ICD-10-CM

## 2010-06-19 NOTE — Discharge Summary (Signed)
  NAME:  Alexander Hanna, Alexander Hanna               ACCOUNT NO.:  [id number]  MEDICAL RECORD NO.:  [id number]           PATIENT TYPE:  I  LOCATION:  [id number]                         FACILITY:  [facility]  PHYSICIAN:  Pietro Cassis. Alvan Dame, M.D.  DATE OF BIRTH:  Nov 10, 1940  DATE OF ADMISSION:  06/13/2010 DATE OF DISCHARGE:                        DISCHARGE SUMMARY - REFERRING   ADDENDUM: Mr. Pedreira had been stable and was prepared to go to [facility] on June 16, 2010; however, bed was not available at the time. Due to his traumatic brain injury, the family was really hoping for a more familiar place at the [facility] in [place] area.  For that reason, he remained in the hospital over the hospital weekend stay.  There were no complicating features.  We did repeat a hematocrit for this morning, June 19, 2010.  At that time, it was noted to 27.8 and stable.  At this point, Alexander Hanna is stable for discharge.  His discharge instructions are unchanged from previously dictated notes.  Any orthopedic questions can be addressed to Dr. Paralee Cancel at [facility] at [phone].     Pietro Cassis Alvan Dame, M.D.     MDO/MEDQ  D:  06/19/2010  T:  06/19/2010  Job:  [id number]  Electronically Signed by Paralee Cancel M.D. on 06/19/2010 12:15:27 PM

## 2010-06-19 NOTE — H&P (Signed)
NAME:  Alexander Hanna, Alexander Hanna NO.:  [id number]  MEDICAL RECORD NO.:  [id number]          PATIENT TYPE:  LOCATION:                                 FACILITY:  PHYSICIAN:  Pietro Cassis. Alvan Dame, M.D.  DATE OF BIRTH:  1940-03-28  DATE OF ADMISSION: DATE OF DISCHARGE:                             HISTORY & PHYSICAL   CHIEF COMPLAINT:  Right hip osteoarthritis.  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  He has failed conservative treatment up to this point and is now scheduled for total hip arthroplasty.  The surgery risk, benefits, and aftercare were discussed in detail with the patient.  Questions invited and answered.  Note with his history of head injury and ill-defined question of associated other head injuries, he will not be a candidate for tranexamic acid.  PAST MEDICAL HISTORY:  Drug allergies, questionable allergy to ANCEF with history of Stevens-Johnson syndrome.  His brother who cares for him will find out exactly if that was Ancef that caused this and we will not use that.  CURRENT MEDICATIONS: 1. Aspirin 81 mg daily. 2. Avodart 0.5 mg daily. 3. Fluticasone 50 mcg spray daily. 4. Levoxyl 112 mcg daily. 5. Loratadine 10 mg daily. 6. Rapaflo 8 mg daily. 7. Simcor 500 mg/200 mg daily. 8. Zetia 10 mg daily.  MEDICAL ILLNESSES:  Brain damage and cognitive functional problems, history of polio, hypothyroidism, hypertension, hypothyroidism, and prostate problems.  PREVIOUS SURGERIES:  Cholecystectomy.  FAMILY HISTORY:  Positive for heart disease, cancer.  SOCIAL HISTORY:  The patient has a 4th grade education.  He lives at home.  He does not smoke and does not drink.  He is cared for by his brother.  REVIEW OF SYSTEMS:  CENTRAL NERVOUS SYSTEM:  Positive for history of head trauma with cognitive disorders, history of polio with impaired vision, impaired  hearing.  PULMONARY:  Negative shortness of breath, PND, and orthopnea.  CARDIOVASCULAR:  Negative for chest pain or palpitation.  GI:  Positive for history of cholecystectomy. GENITOURINARY:  Positive for prostate difficulties.  MUSCULOSKELETAL: Positive as in HPI.  PHYSICAL EXAMINATION:  GENERAL:  This is a well-developed, well- nourished gentleman with some cognitive disorder. HEENT:  Head normocephalic.  Nose patent.  Ears patent.  Pupils are equal, round, and reactive to light. NECK:  Supple without adenopathy.  Carotids are 2+ without bruit. CHEST:  Clear to auscultation.  No rales or rhonchi.  Respirations 16. HEART:  Regular rate and rhythm at 84 beats per minute without murmur. ABDOMEN:  Soft with active bowel sounds.  No masses or organomegaly. NEUROLOGIC:  The patient alert.  He is aware of his surroundings.  He responds appropriately to verbal stimuli. EXTREMITIES:  Shows the right hip with decreased range of motion and pain to range of motion.  Neurovascular status is intact.  ASSESSMENT:  Right hip osteoarthritis.  Plan is total hip arthroplasty, right hip.  Note with his history of head injuries and questionable associated disorders, he is not a candidate for the tranexamic acid.  Judith Part. Chabon, P.A.   ______________________________ Pietro Cassis Alvan Dame, M.D.    SJC/MEDQ  D:  05/24/2010  T:  05/25/2010  Job:  [id number]  Electronically Signed by Gerrit Halls P.A. on 06/19/2010 09:55:10 AM Electronically Signed by Paralee Cancel M.D. on 06/19/2010 12:15:23 PM

## 2010-06-21 ENCOUNTER — Ambulatory Visit (HOSPITAL_COMMUNITY): Payer: Medicare Other | Attending: Internal Medicine

## 2010-06-21 DIAGNOSIS — M7989 Other specified soft tissue disorders: Secondary | ICD-10-CM | POA: Insufficient documentation

## 2010-06-21 DIAGNOSIS — M79609 Pain in unspecified limb: Secondary | ICD-10-CM | POA: Insufficient documentation

## 2010-07-01 ENCOUNTER — Ambulatory Visit (HOSPITAL_COMMUNITY)
Admission: RE | Admit: 2010-07-01 | Discharge: 2010-07-01 | Disposition: A | Discharge status: Home / Self Care | Payer: Medicare Other | Source: Ambulatory Visit | Attending: Internal Medicine | Admitting: Internal Medicine

## 2010-07-01 ENCOUNTER — Inpatient Hospital Stay (HOSPITAL_COMMUNITY): Payer: Medicare Other | Attending: Internal Medicine

## 2010-07-13 ENCOUNTER — Observation Stay (HOSPITAL_COMMUNITY)
Admission: EM | Admit: 2010-07-13 | Discharge: 2010-07-14 | Disposition: A | Discharge status: Home / Self Care | Payer: Medicare Other | Attending: Orthopedic Surgery | Admitting: Orthopedic Surgery

## 2010-07-13 ENCOUNTER — Emergency Department (HOSPITAL_COMMUNITY): Payer: Medicare Other

## 2010-07-13 DIAGNOSIS — Z79899 Other long term (current) drug therapy: Secondary | ICD-10-CM | POA: Insufficient documentation

## 2010-07-13 DIAGNOSIS — T84049A Periprosthetic fracture around unspecified internal prosthetic joint, initial encounter: Principal | ICD-10-CM | POA: Insufficient documentation

## 2010-07-13 DIAGNOSIS — Y831 Surgical operation with implant of artificial internal device as the cause of abnormal reaction of the patient, or of later complication, without mention of misadventure at the time of the procedure: Secondary | ICD-10-CM | POA: Insufficient documentation

## 2010-07-13 DIAGNOSIS — Z96649 Presence of unspecified artificial hip joint: Secondary | ICD-10-CM | POA: Insufficient documentation

## 2010-07-13 DIAGNOSIS — R4701 Aphasia: Secondary | ICD-10-CM | POA: Insufficient documentation

## 2010-07-14 NOTE — H&P (Signed)
NAME:  Alexander Hanna, Alexander Hanna NO.:  [id number]  MEDICAL RECORD NO.:  [id number]           PATIENT TYPE:  E  LOCATION:  WLED                         FACILITY:  [facility]  PHYSICIAN:  Dahlia Bailiff, MD    DATE OF BIRTH:  [birth year]  DATE OF ADMISSION:  07/13/2010 DATE OF DISCHARGE:                             HISTORY & PHYSICAL   ADMISSION DIAGNOSIS:  Periprosthetic right femur fracture.  HISTORY:  This is a very pleasant 70 year old gentleman who is now about 4 weeks' out from a total hip replacement by my partner Dr. Alvan Dame.  The surgical procedure itself was uneventful.  He was recently discharged from rehab back to home.  He is doing exceptionally well, ambulating without any great difficulty.  Today he fell at home.  There was no loss of consciousness, no blurry vision, no headaches, no nausea.  The patient simply lost his footing and fell.  As a result, he was brought to the emergency room for further evaluation and treatment.  X-rays demonstrated a greater trochanteric fracture of the right side with displacement.  There was no extension of the fracture below the level of the greater trochanter.  There is no extension distal to the prosthesis. Prosthesis felt to be unchanged from his original postoperative x-rays. However, because of the fracture, I was consulted to see him. Currently, his pain is a 3 on a scale of 1 to 10.  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  He is hard of hearing.  He does not talk.  He is aphasic, but he understands and follows commands. He has had a cholecystectomy and hip replacement.  Fracture of the skull at the age of 57 with brain damage.  He is a nonsmoker, nondrinker.  He lives with relatives.  ALLERGIES:  He is allergic to AVELOX.  The patient did not bring his medications, unknown what  medications he is on.  His discharge medications from his hospitalization include, 1. Colace. 2. MiraLax. 3. Iron. 4. Vicodin. 5. Robaxin. 6. Avodart. 7. Fluticasone nasal spray daily. 8. Levoxyl. 9. Loratadine. 10.Rapaflo. 11.Simcor. 12.Toviaz. 13.Zetia.  PHYSICAL EXAMINATION:  He is a pleasant gentleman who appears his stated age, in no acute distress.  The incision on the right hip is clean, dry, and intact.  There is no drainage.  No significant swelling.  He has no significant pain with gentle motion of the hip.  No pain at the knee or ankle.  Compartments are soft and nontender.  Abdomen is soft and nontender.  No shortness of breath or chest pain.  X-rays were reviewed.  I agree with radiology report.  At this point in time, I will plan on admitting the patient for observation.  I will keep him on 50% weightbearing status, which is what his discharge instructions I indicated he should be.  This will be partial weightbearing 50%, limited torsion, movement of his right lower extremity.  I will contact Dr. Alvan Dame, so he can see the patient tomorrow and take over care.  At this point, there is no instability.  No need for acute surgical intervention.     Dahlia Bailiff, MD     DDB/MEDQ  D:  07/13/2010  T:  07/14/2010  Job:  [id number]  Electronically Signed by Melina Schools MD on 07/14/2010 04:27:07 PM

## 2010-07-31 NOTE — Discharge Summary (Signed)
  NAME:  Alexander Hanna, Alexander Hanna NO.:  [id number]  MEDICAL RECORD NO.:  [id number]           PATIENT TYPE:  O  LOCATION:  [id number]                         FACILITY:  [facility]  PHYSICIAN:  Dahlia Bailiff, MD    DATE OF BIRTH:  1941/02/03  DATE OF ADMISSION:  07/13/2010 DATE OF DISCHARGE:  07/14/2010                              DISCHARGE SUMMARY   ADMITTING DIAGNOSIS:  Periprosthetic fracture.  HISTORY OF PRESENT ILLNESS:  This is a 70 year old gentleman who is approximately 4 weeks out from a total hip replacement by my partner, Dr. Alvan Dame.  The surgical procedure itself was uneventful and he was doing well, recovering at a home.  The patient fell and noted pain, some discomfort.  As a result, he was brought into the emergency room for further evaluation and treatment.  X-rays in emergency room demonstrated stable periprosthetic femur fracture.  As a result, I was consulted as I was on-call for my group.  Please refer to the dictated H and P for specifics on his past medical, surgical, family, and social history.  His initial clinical exam, he is neurologically intact.  The incision was clean, dry and intact, and there was no evidence of instability. The patient was admitted and started on appropriate physical therapy. The next day, he was seen by Dr. Alvan Dame who outlined an appropriate treatment plan for him.  Once he is discharged, did well with that.  He was discharged with appropriate followup with Dr. Alvan Dame.  There is no change to his medications.  He was voiding spontaneously.  There was no adverse events during his hospitalization.     Dahlia Bailiff, MD     DDB/MEDQ  D:  07/20/2010  T:  07/21/2010  Job:  [id number]  Electronically Signed by Melina Schools MD on 07/31/2010 08:40:19 PM

## 2010-08-17 ENCOUNTER — Ambulatory Visit (HOSPITAL_COMMUNITY)
Admission: RE | Admit: 2010-08-17 | Discharge: 2010-08-17 | Disposition: A | Discharge status: Home / Self Care | Payer: Medicare Other | Source: Ambulatory Visit | Attending: Physical Therapy | Admitting: Physical Therapy

## 2010-08-17 DIAGNOSIS — IMO0001 Reserved for inherently not codable concepts without codable children: Secondary | ICD-10-CM | POA: Insufficient documentation

## 2010-08-17 DIAGNOSIS — M25559 Pain in unspecified hip: Secondary | ICD-10-CM | POA: Insufficient documentation

## 2010-08-17 DIAGNOSIS — M25659 Stiffness of unspecified hip, not elsewhere classified: Secondary | ICD-10-CM | POA: Insufficient documentation

## 2010-08-17 DIAGNOSIS — M6281 Muscle weakness (generalized): Secondary | ICD-10-CM | POA: Insufficient documentation

## 2010-08-17 DIAGNOSIS — R262 Difficulty in walking, not elsewhere classified: Secondary | ICD-10-CM | POA: Insufficient documentation

## 2010-08-24 ENCOUNTER — Ambulatory Visit (HOSPITAL_COMMUNITY)
Admission: RE | Admit: 2010-08-24 | Discharge: 2010-08-24 | Disposition: A | Discharge status: Home / Self Care | Payer: Medicare Other | Source: Ambulatory Visit | Attending: Physical Therapy | Admitting: Physical Therapy

## 2010-08-24 DIAGNOSIS — IMO0001 Reserved for inherently not codable concepts without codable children: Secondary | ICD-10-CM | POA: Insufficient documentation

## 2010-08-24 DIAGNOSIS — M25659 Stiffness of unspecified hip, not elsewhere classified: Secondary | ICD-10-CM | POA: Insufficient documentation

## 2010-08-24 DIAGNOSIS — M6281 Muscle weakness (generalized): Secondary | ICD-10-CM | POA: Insufficient documentation

## 2010-08-24 DIAGNOSIS — R262 Difficulty in walking, not elsewhere classified: Secondary | ICD-10-CM | POA: Insufficient documentation

## 2010-08-24 DIAGNOSIS — M25559 Pain in unspecified hip: Secondary | ICD-10-CM | POA: Insufficient documentation

## 2010-08-29 ENCOUNTER — Ambulatory Visit (HOSPITAL_COMMUNITY): Payer: Medicare Other | Admitting: Physical Therapy

## 2010-08-31 ENCOUNTER — Ambulatory Visit (HOSPITAL_COMMUNITY)
Admission: RE | Admit: 2010-08-31 | Discharge: 2010-08-31 | Disposition: A | Discharge status: Home / Self Care | Payer: Medicare Other | Source: Ambulatory Visit | Attending: Family Medicine | Admitting: Family Medicine

## 2010-09-05 ENCOUNTER — Ambulatory Visit (HOSPITAL_COMMUNITY)
Admission: RE | Admit: 2010-09-05 | Discharge: 2010-09-05 | Disposition: A | Discharge status: Home / Self Care | Payer: Medicare Other | Source: Ambulatory Visit | Attending: Family Medicine | Admitting: Family Medicine

## 2010-09-07 ENCOUNTER — Ambulatory Visit (HOSPITAL_COMMUNITY): Payer: Medicare Other

## 2010-09-12 ENCOUNTER — Ambulatory Visit (HOSPITAL_COMMUNITY)
Admission: RE | Admit: 2010-09-12 | Discharge: 2010-09-12 | Disposition: A | Discharge status: Home / Self Care | Payer: Medicare Other | Source: Ambulatory Visit | Attending: Family Medicine | Admitting: Family Medicine

## 2010-09-14 ENCOUNTER — Ambulatory Visit (HOSPITAL_COMMUNITY)
Admission: RE | Admit: 2010-09-14 | Discharge: 2010-09-14 | Disposition: A | Discharge status: Home / Self Care | Payer: Medicare Other | Source: Ambulatory Visit | Attending: Family Medicine | Admitting: Family Medicine

## 2010-09-19 ENCOUNTER — Ambulatory Visit (HOSPITAL_COMMUNITY)
Admission: RE | Admit: 2010-09-19 | Discharge: 2010-09-19 | Disposition: A | Discharge status: Home / Self Care | Payer: Medicare Other | Source: Ambulatory Visit | Attending: Physical Therapy | Admitting: Physical Therapy

## 2010-09-19 DIAGNOSIS — R262 Difficulty in walking, not elsewhere classified: Secondary | ICD-10-CM | POA: Insufficient documentation

## 2010-09-19 DIAGNOSIS — M25559 Pain in unspecified hip: Secondary | ICD-10-CM | POA: Insufficient documentation

## 2010-09-19 DIAGNOSIS — M6281 Muscle weakness (generalized): Secondary | ICD-10-CM | POA: Insufficient documentation

## 2010-09-19 DIAGNOSIS — IMO0001 Reserved for inherently not codable concepts without codable children: Secondary | ICD-10-CM | POA: Insufficient documentation

## 2010-09-19 DIAGNOSIS — M25659 Stiffness of unspecified hip, not elsewhere classified: Secondary | ICD-10-CM | POA: Insufficient documentation

## 2010-09-26 ENCOUNTER — Ambulatory Visit (HOSPITAL_COMMUNITY)
Admission: RE | Admit: 2010-09-26 | Discharge: 2010-09-26 | Disposition: A | Discharge status: Home / Self Care | Payer: Medicare Other | Source: Ambulatory Visit | Attending: Family Medicine | Admitting: Family Medicine

## 2010-09-26 NOTE — Progress Notes (Signed)
Physical Therapy Treatment Patient Name: Alexander Hanna [id number] Date: 09/26/2010 Initial Evaluation: 08/17/10,  Re-evaluation: Today. Visit: 9/9  Subjective: I only have moderate pain when I am doing some of my exercises.  Overall I am getting much better.  My brother works me out really hard.    Objective:  5 STS: 10.8 sec w/o UE support  (I.E: 22.5 sec w/UE support) TUG: 22.8 sec w/quad cane (IE: 27.2 sec w/RW), pt demonstrated 2 episodes of RLE buckling with full independent recovery with cane. Balance: tandem stance R foot posterior: unable to complete I, needs min A for balance; L foot posteior 5 sec independently (IE: unable to tandem stand independently) Stairs: reciprocal with 2 handrails while ascending and descending stairs.   Ambulates in community w/quad cane x3 days requires min cueing for posture. (I.E: RW with poor posture) MMT: RLE:  Hip - flexion: 4+/5, Abduction: 4+/5, IR: 4/5, ER: 5/5, adduction: 4+/5 Thigh - Quads: 5/5, Hamstrings: 5/5 Visual observation: Pt able to see 6 feet in front of him while looking up, unable to see objects closer to him if they are on the floor.  Poor proprioceptive vision.   Ambulates with RW x25 minutes, with quad cane x10 min. max.  Assessment:  Pt current body structure impairments continued to be difficulty with independent balance (which may be a factor of poor vision), difficulty walking, and decreased functional LE strength and activity tolerance.  Pt will continue to benefit from skilled outpatient therapy to progress toward goals.   Plan:  2x/wk x4 weeks Re-evaluation Complete today.  GOAL Update:  STG 2 weeks 1. Pt will improve tandem stance to 10 sec.  2. Pt will be independent with advanced HEP. LTG 4 weeks 1. Pt will improve LE functional strength and endurance in order to ambulate mod I with cane x30 minutes in order to go out to dinner with his family. 2. Pt will improve LE power and ascend/descend 11 stairs with 1 handrail  with reciprocal pattern in order to safely enter patients home.  3. Pt will improve TUG time to 18 seconds for improved safety in the community.   End of Session Patient Active Problem List  Diagnoses  . OSTEOARTHRITIS, HIP, RIGHT  . KNEE, ARTHRITIS, DEGEN./OSTEO  . ARTHRITIS, RIGHT HIP  . HIP PAIN  . SPINAL STENOSIS     Alexander Hanna 09/26/2010, 4:47 PM

## 2010-09-28 ENCOUNTER — Ambulatory Visit (HOSPITAL_COMMUNITY)
Admission: RE | Admit: 2010-09-28 | Discharge: 2010-09-28 | Disposition: A | Discharge status: Home / Self Care | Payer: Medicare Other | Source: Ambulatory Visit | Attending: Family Medicine | Admitting: Family Medicine

## 2010-09-28 DIAGNOSIS — M25659 Stiffness of unspecified hip, not elsewhere classified: Secondary | ICD-10-CM | POA: Insufficient documentation

## 2010-09-28 DIAGNOSIS — R262 Difficulty in walking, not elsewhere classified: Secondary | ICD-10-CM | POA: Insufficient documentation

## 2010-09-28 NOTE — Progress Notes (Signed)
  Patient Name: Alexander Hanna MRN: [id number] Today's Date: 09/28/2010       Physical Therapy Treatment Note  Time In: 3:10 Time Out: 3:55  Subjective: soreness but not as bad   Objective:  Rounded shoulders, forward head posture  Exercises/Treatments:   Gait training x 15 min with SPC   Retro gait 2 RT   Tandem gait 2 RT   Heel raise 20x    Toe raise 20x   Functional squat 20x   Cybex quad 2.5PL 20x   Cybex hamstring 3.5 PL 20x  Assessment: pt with better technique with gait SPC, vc still required to advance R LE for equal stride length.  Improving balance, less assistance required with tandem and retro gait.  Plan: progress strength and balance, continue with current POC; next re-eval due 10/27/2010.  Charges: Gait 15 min therex 22 min   Ihor Austin, PTA  [name] 09/28/2010, 4:01 PM

## 2010-10-03 ENCOUNTER — Ambulatory Visit (HOSPITAL_COMMUNITY)
Admission: RE | Admit: 2010-10-03 | Discharge: 2010-10-03 | Disposition: A | Discharge status: Home / Self Care | Payer: Medicare Other | Source: Ambulatory Visit | Attending: Family Medicine | Admitting: Family Medicine

## 2010-10-03 DIAGNOSIS — M25659 Stiffness of unspecified hip, not elsewhere classified: Secondary | ICD-10-CM

## 2010-10-03 DIAGNOSIS — R262 Difficulty in walking, not elsewhere classified: Secondary | ICD-10-CM

## 2010-10-03 NOTE — Progress Notes (Signed)
Physical Therapy Treatment Patient Name: Alexander Hanna [id number] Date: 10/03/2010 Initial Evaluation: 08/17/10, Re-evaluation: Today.  Visit: 11/11 Time In: 1525 - pt late secondary to traffic problems Time Out: 1550 Initial Eval 08/17/10  Re-eval Due was 7/10, next 8/10  Subjective: "I've been working really hard at home to walk with the [facility]."  Objective: Ambulates with quad cane with improved stride length.  Gait training x 8 min with SPC w min A   Heel Walking 1 RT  Cybex quad 2.5PL 2x10 w/eccentric lowering w/R leg  Cybex hamstring 3.5 PL 2  Cybex Calf Press on leg extension 4 PL 2x10  Assessment:  Pt tolerated all treatment well today with improved gait with SPC.  Pt continues to have difficulty with R knee extension during stance phase.  Plan: Cont to progress strength and balance.   Charges: 8 min gait, 15 min TE   End of Session Patient Active Problem List  Diagnoses  . OSTEOARTHRITIS, HIP, RIGHT  . KNEE, ARTHRITIS, DEGEN./OSTEO  . ARTHRITIS, RIGHT HIP  . HIP PAIN  . SPINAL STENOSIS  . Difficulty in walking  . Stiffness of joint, not elsewhere classified, pelvic region and thigh       Avni Traore 10/03/2010, 3:32 PM

## 2010-10-05 ENCOUNTER — Ambulatory Visit (HOSPITAL_COMMUNITY)
Admission: RE | Admit: 2010-10-05 | Discharge: 2010-10-05 | Disposition: A | Discharge status: Home / Self Care | Payer: Medicare Other | Source: Ambulatory Visit | Attending: Family Medicine | Admitting: Family Medicine

## 2010-10-05 NOTE — Progress Notes (Signed)
Physical Therapy Treatment Patient Name: Alexander Hanna [id number] Date: 10/05/2010  Time In: 3:04 Time Out: 3:45 # of visits: 12/12 Charge: gait x 15 min therex 15 min Neuro re-ed 10 min  Subjective: Symptoms/Limitations Symptoms: care taker stated pt amb with quad cane across parking lot with little diff with incline slope, caretaker also asked about most effective way to get up from a falls. Pain Assessment Currently in Pain?: No/denies  Objective: increase distance ambulated with quad cane entering dept before session.    Exercise/Treatments Gait training with quad cane 2 mod A  incline/decline slopes- forward slope/retro Floor  standing transitions 5x Heel raises 20 reps Toe raises 20 reps Heel Walking 1 RT  Cybex quad 2.5PL 2x10 w/eccentric lowering w/R leg  Cybex hamstring 3.5 PL 2  Cybex Calf Press on leg extension 4 PL 2x10 Stability Exercises Heel Raises: 20 reps Lumbar Machine Exercises Cybex Knee Extension: 2.5 PL 20x Cybex Knee Flexion: 3.5 PL 20x Cybex Press: 2.5 PL 2x 10 Knee Exercises Heel Raises: 20 reps Ankle Exercises Heel Raises: 20 reps Additional Ankle Exercises Heel Walk (Round Trip): 1 RT Balance Exercises Tandem Walking: 1 round trip Retro Gait: 1 round trip Heel Raises: 20 reps      PT - End of Session Equipment Utilized During Treatment: Gait belt Activity Tolerance: Patient tolerated treatment well General Behavior During Session: [facility] for tasks performed Cognition: Impaired, at baseline PT Assessment and Plan Clinical Impression Statement: Focused on functional tasks with treatment.  Layed out mat and had pt half kneel to full kneeling to sitting, pt able to independently transition from kneeling to stand without assistance following demonstration for correct tech.  Pt amb with quad cane incline and decline slope in dept with 2 mod assistance for safety.   Performed forward and retro gait on slope. PT Plan: continue progressing  strength and functional independence.  [name] 10/05/2010, 4:11 PM

## 2010-10-10 ENCOUNTER — Ambulatory Visit (HOSPITAL_COMMUNITY)
Admission: RE | Admit: 2010-10-10 | Discharge: 2010-10-10 | Disposition: A | Discharge status: Home / Self Care | Payer: Medicare Other | Source: Ambulatory Visit | Attending: Family Medicine | Admitting: Family Medicine

## 2010-10-10 DIAGNOSIS — M25659 Stiffness of unspecified hip, not elsewhere classified: Secondary | ICD-10-CM

## 2010-10-10 DIAGNOSIS — R262 Difficulty in walking, not elsewhere classified: Secondary | ICD-10-CM

## 2010-10-10 NOTE — Progress Notes (Signed)
Physical Therapy Treatment Patient Name: Alexander Hanna [id number] Date: 10/10/2010 Initial Evaluation: 08/17/10, Re-evaluation due 8/1 Visit: 13/13; Visit since Re-evaluation: 2 of 2 Time : [id number] - [id number] Charges: 25 min TE, 15 min Gait HPI: Symptoms/Limitations Symptoms: "I'm doing my band exercises one day and my bed exercises at least 1x a day sometimes two.  Then I do my standing and balance exercise with my brother."  Pt ambulates with quad cane   Mobility (including Balance) Ambulation/Gait Ambulation/Gait: Yes Ambulation/Gait Assistance: 5: Supervision;4: Min assist Ambulation/Gait Assistance Details (indicate cue type and reason): cueing for stride lenght, cadence and gait mechanics to improve R knee flexion.  Ambulation Distance (Feet): 230 Feet Assistive device: Straight cane Gait Pattern: Decreased stride length;Decreased hip/knee flexion - right;Decreased stance time - right     Exercise/Treatments TM: 5 min, 3:30 min between 0.8 and 1.2 mph w/cueing for cadence, stride length and posture Gait Training: 230 ft' w/SPC after TM - improved cadence.   STANDING:  Hip Extension BLE 10x each Knee Flexion 5# 10x RLE -cueing for appropriate posture to activate gluteus maximus Gluteal Sets 3x5 sec hold -cueing for activation.   Hay Bails 10x each direction SEATED Cybex Knee Extension: 3.0 PL 20x  Cybex Knee Flexion: 4 PL 20x  Cybex Calf Press: 3 PL 2x 10   Goals   End of Session Patient Active Problem List  Diagnoses  . OSTEOARTHRITIS, HIP, RIGHT  . KNEE, ARTHRITIS, DEGEN./OSTEO  . ARTHRITIS, RIGHT HIP  . HIP PAIN  . SPINAL STENOSIS  . Difficulty in walking  . Stiffness of joint, not elsewhere classified, pelvic region and thigh   PT Assessment and Plan Clinical Impression Statement: Pt had a much improved cadence with SPC after TM training today with minimal cueing.  Pt continues to increase his strength and functional balance.  He is still limited by decreased pelvic  rotation, and R knee flexion with gait.   PT Plan: Continue to progress.   Sharyah Bostwick 10/10/2010, 4:45 PM

## 2010-10-12 ENCOUNTER — Ambulatory Visit (HOSPITAL_COMMUNITY)
Admission: RE | Admit: 2010-10-12 | Discharge: 2010-10-12 | Disposition: A | Discharge status: Home / Self Care | Payer: Medicare Other | Source: Ambulatory Visit | Attending: Family Medicine | Admitting: Family Medicine

## 2010-10-12 NOTE — Progress Notes (Signed)
Physical Therapy Treatment Patient Name: Alexander Hanna [id number] Date: 10/12/2010  Time In: 3:00 Time Out: 3:55 Visit #: 14/14 Next Re-eval: 10/18/2010, next tx. Charge: gait: 30 min therex 15 min  Subjective: Symptoms/Limitations Symptoms: I'm doing all my exercises at home."  Pt amb with quad cane. Pain Assessment Currently in Pain?: No/denies  Objective: increase cadence with gait quadcane, improved posture  Exercise/Treatments Gait on TM x 7 min, varied from .8-1.3 Gait outdoors from rehab --> short stay entrance to main entrance--> 3x incline/decline slope outside exit entrance PT dept --> back to short stay entrance--> rehab entrance x 23 min Cybex knee extend 3.0 PL 20x Cybex calf press 3.0 PL 20x Cybex knee flexion 4.0 PL 20x  Lumbar Machine Exercises Cybex Knee Extension: 3 PL 2x10 Cybex Knee Flexion: 4 PL 2x10 Cybex Press: 3 PL 2x10 Tread Mill: 7 min various from .8-1.3 Additional Hip Exercises Tread Mill: 7 min various from .8-1.3 Balance Exercises Tread Mill: 7 min various from .8-1.3 Gait on Ramp/Curb: incline/decline 3 reps outdoors    Goals   End of Session Patient Active Problem List  Diagnoses  . OSTEOARTHRITIS, HIP, RIGHT  . KNEE, ARTHRITIS, DEGEN./OSTEO  . ARTHRITIS, RIGHT HIP  . HIP PAIN  . SPINAL STENOSIS  . Difficulty in walking  . Stiffness of joint, not elsewhere classified, pelvic region and thigh   PT - End of Session Equipment Utilized During Treatment: Gait belt Activity Tolerance: Patient tolerated treatment well General Behavior During Session: [facility] for tasks performed Cognition: Impaired, at baseline PT Assessment and Plan Clinical Impression Statement: Amb outdoors, incline/decline slopes with Quadcane.  VC required to increase stride length w R LE on treadmill, to increase cadence with outdoor gait, no vc required to control speed with slopes.  No LOB episodes with gait.  Pt continues to increase strength and functional;  balance. PT Treatment/Interventions: Therapeutic exercise;Gait training PT Plan: Continue to progress, re-eval due 10/18/2010.  [name] 10/12/2010, 4:12 PM

## 2010-10-17 ENCOUNTER — Ambulatory Visit (HOSPITAL_COMMUNITY)
Admission: RE | Admit: 2010-10-17 | Discharge: 2010-10-17 | Disposition: A | Discharge status: Home / Self Care | Payer: Medicare Other | Source: Ambulatory Visit | Attending: Family Medicine | Admitting: Family Medicine

## 2010-10-17 DIAGNOSIS — M25659 Stiffness of unspecified hip, not elsewhere classified: Secondary | ICD-10-CM

## 2010-10-17 DIAGNOSIS — R262 Difficulty in walking, not elsewhere classified: Secondary | ICD-10-CM

## 2010-10-17 NOTE — Progress Notes (Addendum)
Physical Therapy Treatment Patient Name: Alexander Hanna [id number] Date: 10/17/2010 Visit #: 15/15  Next Re-eval: 10/26/2010 Charge: gait: 32 min  therex 15 min HPI: Symptoms/Limitations Symptoms: Pt reports that he has done a lot of walking today and his R knee/hamstring was sore.  Pt ambulates in with quad cane with improved R step length and stride length. Pain Assessment Currently in Pain?: Yes Pain Location: Knee Pain Orientation: Right  Exercise/Treatments STANDING: Gait on TM x 4:30 min, 3:30 min, 2 min from 1.3 to 1.38mph w/ 2 min RB in-between - cueing for stride length and gait mechanics  SEATED Cybex knee extend 3.5 PL 3x10 Cybex calf press 3.5 PL 3x10  Cybex knee flexion 4.5 PL 3x10 Tandem Stance 6x10 sec each.  SUPINE:  Active HS Stretch 3x30 sec  ITB stretch 3x30 sec Goals PT Short Term Goals Short Term Goal 1: 1. Pt will improve tandem stance to 10 sec.  Short Term Goal 1 Progress: Progressing toward goal Short Term Goal 2: 2. Pt will be independent with advanced HEP. Short Term Goal 2 Progress: Met PT Long Term Goals Long Term Goal 1: 1. Pt will improve LE functional strength and endurance in order to ambulate mod I with cane x30 minutes in order to go out to dinner with his family. Long Term Goal 1 Progress: Progressing toward goal Long Term Goal 2: 2. Pt will improve LE power and ascend/descend 11 stairs with 1 handrail with reciprocal pattern in order to safely enter patients home.  Long Term Goal 2 Progress: Progressing toward goal Long Term Goal 3: 3. Pt will improve TUG time to 18 seconds for improved safety in the community. Long Term Goal 3 Progress: Progressing toward goal End of Session Patient Active Problem List  Diagnoses  . OSTEOARTHRITIS, HIP, RIGHT  . KNEE, ARTHRITIS, DEGEN./OSTEO  . ARTHRITIS, RIGHT HIP  . HIP PAIN  . SPINAL STENOSIS  . Difficulty in walking  . Stiffness of joint, not elsewhere classified, pelvic region and thigh   PT - End  of Session Activity Tolerance: Patient tolerated treatment well PT Assessment and Plan Clinical Impression Statement: Pt has improved stride length with his quad cane.  Pt was able to tolerate increased weight, rep and sets today.  Pt continues to have difficulty with tandem stance and decreased flexibility.  PT Plan: Re-eval due in 3 treatments.   Taydon Nasworthy 10/17/2010, 4:03 PM

## 2010-10-19 ENCOUNTER — Ambulatory Visit (HOSPITAL_COMMUNITY)
Admission: RE | Admit: 2010-10-19 | Discharge: 2010-10-19 | Disposition: A | Discharge status: Home / Self Care | Payer: Medicare Other | Source: Ambulatory Visit | Attending: Family Medicine | Admitting: Family Medicine

## 2010-10-19 DIAGNOSIS — IMO0001 Reserved for inherently not codable concepts without codable children: Secondary | ICD-10-CM | POA: Insufficient documentation

## 2010-10-19 DIAGNOSIS — M25659 Stiffness of unspecified hip, not elsewhere classified: Secondary | ICD-10-CM | POA: Insufficient documentation

## 2010-10-19 DIAGNOSIS — M25559 Pain in unspecified hip: Secondary | ICD-10-CM | POA: Insufficient documentation

## 2010-10-19 DIAGNOSIS — R262 Difficulty in walking, not elsewhere classified: Secondary | ICD-10-CM | POA: Insufficient documentation

## 2010-10-19 DIAGNOSIS — M6281 Muscle weakness (generalized): Secondary | ICD-10-CM | POA: Insufficient documentation

## 2010-10-19 NOTE — Progress Notes (Signed)
Physical Therapy Treatment Patient Name: Alexander Hanna [id number] Date: 10/19/2010  Time In: 3:30 Time Out: 4:30 Visit #: 16/16 Next Re-eval: 2 more sessions Charge: Gait x 35 min Therex 10 min Neuro Re-ed 5 min  Subjective: Symptoms/Limitations Symptoms: No pain today, legs are a little sore following exercises.  Pt amb with quad cane with improved stride length and increased cadence. Pain Assessment Currently in Pain?: No/denies Multiple Pain Sites: No  Objective:   Exercise/Treatments Gait on TM x 5 min x 2, 1.3 to 1.71mph w/ 2 min RB in-between - cueing for stride length, posture, and gait mechanics. 2RT reciprocal stairs with 1 HR. 2 flights descending stairs to slope with 2 min rest break then 1 RT ascending/ descending slope without cane. SEATED  Cybex knee extend 3.5 PL 3x10  Cybex calf press 3.5 PL 3x10  Cybex knee flexion 4.5 PL 3x10  Tandem Stance  RT down line 10 sec hold each.  SUPINE:  Active HS Stretch 3x30 sec     Goals   End of Session Patient Active Problem List  Diagnoses  . OSTEOARTHRITIS, HIP, RIGHT  . KNEE, ARTHRITIS, DEGEN./OSTEO  . ARTHRITIS, RIGHT HIP  . HIP PAIN  . SPINAL STENOSIS  . Difficulty in walking  . Stiffness of joint, not elsewhere classified, pelvic region and thigh   General Behavior During Session: [facility] [place] [facility] for tasks performed Cognition: Impaired, at baseline PT Assessment and Plan Clinical Impression Statement: Pt with improved endurance.  Able to amb on TM for longer periods of time with less vc for posture and equal stride length.  No cues required for reciprocal gait, performed with no AD 1 HR. PT Plan: Re-eval in 2 more sessions.  [name] 10/19/2010, 5:02 PM

## 2010-10-24 ENCOUNTER — Ambulatory Visit (HOSPITAL_COMMUNITY)
Admission: RE | Admit: 2010-10-24 | Discharge: 2010-10-24 | Disposition: A | Discharge status: Home / Self Care | Payer: Medicare Other | Source: Ambulatory Visit | Attending: Family Medicine | Admitting: Family Medicine

## 2010-10-24 DIAGNOSIS — M25659 Stiffness of unspecified hip, not elsewhere classified: Secondary | ICD-10-CM

## 2010-10-24 DIAGNOSIS — R262 Difficulty in walking, not elsewhere classified: Secondary | ICD-10-CM

## 2010-10-24 NOTE — Progress Notes (Signed)
Physical Therapy Treatment Patient Name: Alexander Hanna [id number] Date: 10/24/2010  Time In: 3:18 Time Out: 4:10 Visit #: 17/17 Next Re-eval: Next session Charge: Gait x 29 min therex x 15 min  Subjective: Symptoms/Limitations Symptoms: No pain today, legs little sore following ex earlier today. Pain Assessment Currently in Pain?: No/denies  Objective:  Exercise/Treatments Lumbar Machine Exercises Cybex Knee Extension: 4PL 3x10 Cybex Knee Flexion: 5 PL 3x 10 Cybex Press: 3.5 PL 3x10 Balance Exercises Gait on Grass: 29 min outdoor gait with ramps, curbs, grass with SPC Gait on Ramp/Curb: 29 min outdoor gait with ramps, curbs, grass with SPC    Goals   End of Session Patient Active Problem List  Diagnoses  . OSTEOARTHRITIS, HIP, RIGHT  . KNEE, ARTHRITIS, DEGEN./OSTEO  . ARTHRITIS, RIGHT HIP  . HIP PAIN  . SPINAL STENOSIS  . Difficulty in walking  . Stiffness of joint, not elsewhere classified, pelvic region and thigh   PT - End of Session Activity Tolerance: Patient tolerated treatment well General Behavior During Session: [facility] for tasks performed Cognition: Impaired, at baseline PT Assessment and Plan Clinical Impression Statement: Pt amb outdoors with min A with uneven surfaces on grass, incline/decline slopes/ramps and stepping up and down curbs.  Pt with 3 LOB episodes during total gait, pt able to regain balance independently with no assistance required. PT Plan: Re-eval next session.  Alexander Hanna 10/24/2010, 7:14 PM

## 2010-10-26 ENCOUNTER — Ambulatory Visit (HOSPITAL_COMMUNITY)
Admission: RE | Admit: 2010-10-26 | Discharge: 2010-10-26 | Disposition: A | Discharge status: Home / Self Care | Payer: Medicare Other | Source: Ambulatory Visit | Attending: Physical Therapy | Admitting: Physical Therapy

## 2010-10-26 DIAGNOSIS — R262 Difficulty in walking, not elsewhere classified: Secondary | ICD-10-CM

## 2010-10-26 DIAGNOSIS — M25659 Stiffness of unspecified hip, not elsewhere classified: Secondary | ICD-10-CM

## 2010-10-26 NOTE — Progress Notes (Signed)
Physical Therapy Treatment Patient Name: Alexander Hanna [id number] Date: 10/26/2010 Time In: 3:30  Time Out: 4:21 Visit #: 18/18  Next Re-eval: Next session  Charge: Gait x 30 min  therex x 10 min  HPI:  "I am feeling pretty good.  I am doing a lot of my exercises at home."  Pt brings in 2 [facility] to use for therapy today.     Mobility (including Balance) Ambulation/Gait Ambulation/Gait: Yes Ambulation/Gait Assistance: 4: Min assist;5: Supervision;6: Modified independent (Device/Increase time) Ambulation/Gait Assistance Details (indicate cue type and reason): mod cueing for stride length, cadance and posture.  Pt able to self correct for approrpriate posture.   Ambulation Distance (Feet):  (30 minutes in open/closed environment) Assistive device: Straight cane Gait Pattern: Step-through pattern;Decreased stride length;Decreased hip/knee flexion - right Stairs: Yes Stairs Assistance: 5: Supervision;4: Min assist;6: Modified independent (Device/Increase time) Stairs Assistance Details (indicate cue type and reason): Mod I to ascend stairs, min A- S w/descending stairs.  Cueing for sequencing Stair Management Technique: One rail Left Number of Stairs: 10  Height of Stairs: 6  Door Management: 4: Min assist Door Managment Details (indicate cue type and reason): Assist for closing door, able to independently open door.     Exercise/Treatments  Cybex  Leg Extension: 4PL 3x10 BLE  Knee Flexion: 4.5 PL 3x10 BLE    Goals  Progressing towards.  End of Session Patient Active Problem List  Diagnoses  . OSTEOARTHRITIS, HIP, RIGHT  . KNEE, ARTHRITIS, DEGEN./OSTEO  . ARTHRITIS, RIGHT HIP  . HIP PAIN  . SPINAL STENOSIS  . Difficulty in walking  . Stiffness of joint, not elsewhere classified, pelvic region and thigh   PT - End of Session Activity Tolerance: Patient tolerated treatment well PT Assessment and Plan Clinical Impression Statement: Pt continues to progress with SPC on uneven,  even surfaces in open and closed environment with increased amount of clutter on the ground.  Pt has gained significant functional strength and postural awareness with ambulation. PT Plan: Cont to progress.  RE-EVAL next VISIT  Alexander Hanna 10/26/2010, 4:41 PM

## 2010-10-31 ENCOUNTER — Ambulatory Visit (HOSPITAL_COMMUNITY)
Admission: RE | Admit: 2010-10-31 | Discharge: 2010-10-31 | Disposition: A | Discharge status: Home / Self Care | Payer: Medicare Other | Source: Ambulatory Visit | Attending: Physical Therapy | Admitting: Physical Therapy

## 2010-10-31 DIAGNOSIS — M25659 Stiffness of unspecified hip, not elsewhere classified: Secondary | ICD-10-CM

## 2010-10-31 DIAGNOSIS — R262 Difficulty in walking, not elsewhere classified: Secondary | ICD-10-CM

## 2010-10-31 NOTE — Progress Notes (Signed)
Physical Therapy Treatment Alexander Hanna Name: Alexander Hanna Today's Date: 10/31/2010 Time: 15:20-15:53 Charges: TA x 33 min 5 STS: 9.6 sec TUG: 23 sec w/SPC Gait outdoors on multiple surfaces  1 RT ascend/descend stairs  Completed activities with Alexander Hanna that he will have to do at his own house.  Physical Therapy D/C summary  Diagnosis: R THR ICD-9 Code: 719.7 Referring practitioner: Dr. Alvan Dame Date of next MD visit: Oct 2012 Date of initial PT Visit: 08/17/10 Alexander Hanna seen for 9 sessions Alexander Hanna seen for 19 sessions  Subjective:    Alexander Hanna's response to therapy: Pt and pt caretaker (brother) state that he is completing all his exercises on his own without difficulty.  He continues to make progress with his balance and independence at home.  He reports some muscular pain, but denies joint pain.   Objective:   Current condition: Test measurement: 5 STS: 9.6 sec (10.8 sec w/o UE support) TUG: 23 sec w/SPC w/o LOB. (22.8 sec w/quad cane)  Balance: tandem stance R foot posterior: 5 sec independently (unable to complete I, needs min A for balance) Stairs: 1 handrail w/reciprocal pattern x11 stairs (reciprocal with 2 handrails while ascending and descending stairs) Ambulates w/SPC in community w/mod I.  Needs min A for for grass mobility, able to complete inclines and declines on pavement and concrete on his own. (community w/quad cane x3 days requires min cueing for posture) MMT: RLE [facility]   Ambulates with SPC x20 minutes.     Assessment:   Summary/analysis of evaluation: Alexander Hanna was referred to PT s/p R THR w/subsequent R hip fracture.  After 8 weeks of therapy pt has made significant progress with functional mobility and functional strength. Pt continues to have decreased cadence and gait speed secondary to moderate increase in fear with community mobility, but has made significant progress in overcoming anxiety and fear. Alexander Hanna was able to demonstrate gait with [facility] mod I with  appropriate safety.  Pt will continue to benefit from completing his exercises at home.    Plan:   Goals PT Short Term Goals Short Term Goal 1: 1. Pt will improve tandem stance to 10 sec.  Short Term Goal 2: 2. Pt will be independent with advanced HEP. Short Term Goal 2 Progress: Met PT Long Term Goals Long Term Goal 1: 1. Pt will improve LE functional strength and endurance in order to ambulate mod I with cane x30 minutes in order to go out to dinner with his family. Long Term Goal 1 Progress: Met Long Term Goal 2: 2. Pt will improve LE power and ascend/descend 11 stairs with 1 handrail with reciprocal pattern in order to safely enter patients home.  Long Term Goal 3: 3. Pt will improve TUG time to 18 seconds for improved safety in the community. Long Term Goal 3 Progress: Met  Treatment Principle of treatment/treatment today: Strengthening, Stretching, Balance Re-training, Gait training   Plan Recommendations: D/C w/HEP

## 2010-11-02 ENCOUNTER — Ambulatory Visit (HOSPITAL_COMMUNITY): Payer: Medicare Other

## 2010-11-07 ENCOUNTER — Ambulatory Visit (HOSPITAL_COMMUNITY): Payer: Medicare Other | Admitting: Physical Therapy

## 2010-11-09 ENCOUNTER — Ambulatory Visit (HOSPITAL_COMMUNITY): Payer: Medicare Other | Admitting: Physical Therapy

## 2010-11-14 ENCOUNTER — Ambulatory Visit (HOSPITAL_COMMUNITY): Payer: Medicare Other

## 2010-11-16 ENCOUNTER — Ambulatory Visit (HOSPITAL_COMMUNITY): Payer: Medicare Other | Admitting: Physical Therapy

## 2011-12-17 ENCOUNTER — Encounter (HOSPITAL_COMMUNITY)
Admission: RE | Admit: 2011-12-17 | Discharge: 2011-12-17 | Disposition: A | Discharge status: Home / Self Care | Payer: Medicare Other | Source: Ambulatory Visit | Attending: Ophthalmology | Admitting: Ophthalmology

## 2011-12-17 ENCOUNTER — Encounter (HOSPITAL_COMMUNITY): Payer: Self-pay | Admitting: Pharmacy Technician

## 2011-12-17 ENCOUNTER — Encounter (HOSPITAL_COMMUNITY): Payer: Self-pay

## 2011-12-17 ENCOUNTER — Other Ambulatory Visit: Payer: Self-pay

## 2011-12-17 HISTORY — DX: Hypothyroidism, unspecified: E03.9

## 2011-12-17 HISTORY — DX: Acute poliomyelitis, unspecified: A80.9

## 2011-12-17 HISTORY — DX: Other allergy status, other than to drugs and biological substances: Z91.09

## 2011-12-17 HISTORY — DX: Unspecified intellectual disabilities: F79

## 2011-12-17 HISTORY — DX: Overactive bladder: N32.81

## 2011-12-17 NOTE — Patient Instructions (Addendum)
20 CAELIN OPPY  12/17/2011   Your procedure is scheduled on:  12/20/2011  Report to [facility] at 12:30 PM.  Call this number if you have problems the morning of surgery: [phone]   Remember:   Do not eat or drink:After Midnight.  Take these medicines the morning of surgery with A SIP OF WATER: levoxyl,loratadine,toviaz,rapaflo,avaodart   Do not wear jewelry, make-up or nail polish.  Do not wear lotions, powders, or perfumes. You may wear deodorant.  Do not bring valuables to the hospital.  Contacts, dentures or bridgework may not be worn into surgery.  Leave suitcase in the car. After surgery it may be brought to your room.  For patients admitted to the hospital, checkout time is 11:00 AM the day of discharge.   Patients discharged the day of surgery will not be allowed to drive home.  Special Instructions:Start using your eye drops as prescribed by your eye doctor.   Please read over the following fact sheets that you were given: Anesthesia Post-op Instructions    Cataract Surgery  A cataract is a clouding of the lens of the eye. When a lens becomes cloudy, vision is reduced based on the degree and nature of the clouding. Surgery may be needed to improve vision. Surgery removes the cloudy lens and usually replaces it with a substitute lens (intraocular lens, IOL). LET YOUR EYE DOCTOR KNOW ABOUT:  Allergies to food or medicine.   Medicines taken including herbs, eyedrops, over-the-counter medicines, and creams.   Use of steroids (by mouth or creams).   Previous problems with anesthetics or numbing medicine.   History of bleeding problems or blood clots.   Previous surgery.   Other health problems, including diabetes and kidney problems.   Possibility of pregnancy, if this applies.  RISKS AND COMPLICATIONS  Infection.   Inflammation of the eyeball (endophthalmitis) that can spread to both eyes (sympathetic ophthalmia).   Poor wound healing.   If an IOL is  inserted, it can later fall out of proper position. This is very uncommon.   Clouding of the part of your eye that holds an IOL in place. This is called an "after-cataract." These are uncommon, but easily treated.  BEFORE THE PROCEDURE  Do not eat or drink anything except small amounts of water for 8 to 12 before your surgery, or as directed by your caregiver.   Unless you are told otherwise, continue any eyedrops you have been prescribed.   Talk to your primary caregiver about all other medicines that you take (both prescription and non-prescription). In some cases, you may need to stop or change medicines near the time of your surgery. This is most important if you are taking blood-thinning medicine.Do not stop medicines unless you are told to do so.   Arrange for someone to drive you to and from the procedure.   Do not put contact lenses in either eye on the day of your surgery.  PROCEDURE There is more than one method for safely removing a cataract. Your doctor can explain the differences and help determine which is best for you. Phacoemulsification surgery is the most common form of cataract surgery.  An injection is given behind the eye or eyedrops are given to make this a painless procedure.   A small cut (incision) is made on the edge of the clear, dome-shaped surface that covers the front of the eye (cornea).   A tiny probe is painlessly inserted into the eye. This device gives off ultrasound  waves that soften and break up the cloudy center of the lens. This makes it easier for the cloudy lens to be removed by suction.   An IOL may be implanted.   The normal lens of the eye is covered by a clear capsule. Part of that capsule is intentionally left in the eye to support the IOL.   Your surgeon may or may not use stitches to close the incision.  There are other forms of cataract surgery that require a larger incision and stiches to close the eye. This approach is taken in cases  where the doctor feels that the cataract cannot be easily removed using phacoemulsification. AFTER THE PROCEDURE  When an IOL is implanted, it does not need care. It becomes a permanent part of your eye and cannot be seen or felt.   Your doctor will schedule follow-up exams to check on your progress.   Review your other medicines with your doctor to see which can be resumed after surgery.   Use eyedrops or take medicine as prescribed by your doctor.  Document Released: 02/22/2011 Document Reviewed: 02/19/2011 [facility] Patient Information 2012 [place].   PATIENT INSTRUCTIONS POST-ANESTHESIA  IMMEDIATELY FOLLOWING SURGERY:  Do not drive or operate machinery for the first twenty four hours after surgery.  Do not make any important decisions for twenty four hours after surgery or while taking narcotic pain medications or sedatives.  If you develop intractable nausea and vomiting or a severe headache please notify your doctor immediately.  FOLLOW-UP:  Please make an appointment with your surgeon as instructed. You do not need to follow up with anesthesia unless specifically instructed to do so.  WOUND CARE INSTRUCTIONS (if applicable):  Keep a dry clean dressing on the anesthesia/puncture wound site if there is drainage.  Once the wound has quit draining you may leave it open to air.  Generally you should leave the bandage intact for twenty four hours unless there is drainage.  If the epidural site drains for more than 36-48 hours please call the anesthesia department.  QUESTIONS?:  Please feel free to call your physician or the hospital operator if you have any questions, and they will be happy to assist you.

## 2011-12-19 MED ORDER — NEOMYCIN-POLYMYXIN-DEXAMETH 3.5-10000-0.1 OP OINT
TOPICAL_OINTMENT | OPHTHALMIC | Status: AC
  Filled 2011-12-19: qty 3.5

## 2011-12-19 MED ORDER — LIDOCAINE HCL 3.5 % OP GEL
OPHTHALMIC | Status: AC
  Filled 2011-12-19: qty 5

## 2011-12-19 MED ORDER — CYCLOPENTOLATE HCL 1 % OP SOLN
OPHTHALMIC | Status: AC
  Filled 2011-12-19: qty 2

## 2011-12-19 MED ORDER — LIDOCAINE HCL (PF) 1 % IJ SOLN
INTRAMUSCULAR | Status: AC
  Filled 2011-12-19: qty 2

## 2011-12-19 MED ORDER — TETRACAINE HCL 0.5 % OP SOLN
OPHTHALMIC | Status: AC
  Filled 2011-12-19: qty 2

## 2011-12-19 MED ORDER — PHENYLEPHRINE HCL 2.5 % OP SOLN
OPHTHALMIC | Status: AC
  Filled 2011-12-19: qty 2

## 2011-12-20 ENCOUNTER — Encounter (HOSPITAL_COMMUNITY): Payer: Self-pay | Admitting: Anesthesiology

## 2011-12-20 ENCOUNTER — Ambulatory Visit (HOSPITAL_COMMUNITY)
Admission: RE | Admit: 2011-12-20 | Discharge: 2011-12-20 | Disposition: A | Discharge status: Home / Self Care | Payer: Medicare Other | Source: Ambulatory Visit | Attending: Ophthalmology | Admitting: Ophthalmology

## 2011-12-20 ENCOUNTER — Ambulatory Visit (HOSPITAL_COMMUNITY): Payer: Medicare Other | Admitting: Anesthesiology

## 2011-12-20 ENCOUNTER — Encounter (HOSPITAL_COMMUNITY)
Admission: RE | Disposition: A | Discharge status: Home / Self Care | Payer: Self-pay | Source: Ambulatory Visit | Attending: Ophthalmology

## 2011-12-20 ENCOUNTER — Encounter (HOSPITAL_COMMUNITY): Payer: Self-pay | Admitting: *Deleted

## 2011-12-20 DIAGNOSIS — H2181 Floppy iris syndrome: Secondary | ICD-10-CM | POA: Insufficient documentation

## 2011-12-20 DIAGNOSIS — H2589 Other age-related cataract: Secondary | ICD-10-CM | POA: Insufficient documentation

## 2011-12-20 HISTORY — PX: CATARACT EXTRACTION W/PHACO: SHX586

## 2011-12-20 SURGERY — PHACOEMULSIFICATION, CATARACT, WITH IOL INSERTION
Anesthesia: Monitor Anesthesia Care | Site: Eye | Laterality: Left | Wound class: Clean

## 2011-12-20 MED ORDER — BSS IO SOLN
INTRAOCULAR | Status: DC | PRN
  Administered 2011-12-20: 15 mL via INTRAOCULAR

## 2011-12-20 MED ORDER — LACTATED RINGERS IV SOLN
INTRAVENOUS | Status: DC | PRN
  Administered 2011-12-20: 12:00:00 via INTRAVENOUS

## 2011-12-20 MED ORDER — EPINEPHRINE HCL 1 MG/ML IJ SOLN
INTRAOCULAR | Status: DC | PRN
  Administered 2011-12-20: 13:00:00

## 2011-12-20 MED ORDER — PHENYLEPHRINE HCL 2.5 % OP SOLN
1.0000 [drp] | OPHTHALMIC | Status: AC
  Administered 2011-12-20 (×3): 1 [drp] via OPHTHALMIC

## 2011-12-20 MED ORDER — EPINEPHRINE HCL 1 MG/ML IJ SOLN
INTRAMUSCULAR | Status: AC
  Filled 2011-12-20: qty 1

## 2011-12-20 MED ORDER — TETRACAINE HCL 0.5 % OP SOLN
1.0000 [drp] | OPHTHALMIC | Status: AC
  Administered 2011-12-20 (×3): 1 [drp] via OPHTHALMIC

## 2011-12-20 MED ORDER — LACTATED RINGERS IV SOLN
INTRAVENOUS | Status: DC
  Administered 2011-12-20: 1000 mL via INTRAVENOUS

## 2011-12-20 MED ORDER — POVIDONE-IODINE 5 % OP SOLN
OPHTHALMIC | Status: DC | PRN
  Administered 2011-12-20: 1 via OPHTHALMIC

## 2011-12-20 MED ORDER — MIDAZOLAM HCL 2 MG/2ML IJ SOLN
1.0000 mg | INTRAMUSCULAR | Status: DC | PRN
  Administered 2011-12-20: 2 mg via INTRAVENOUS

## 2011-12-20 MED ORDER — LIDOCAINE HCL 3.5 % OP GEL
1.0000 "application " | Freq: Once | OPHTHALMIC | Status: AC
  Administered 2011-12-20: 1 via OPHTHALMIC

## 2011-12-20 MED ORDER — PROVISC 10 MG/ML IO SOLN
INTRAOCULAR | Status: DC | PRN
  Administered 2011-12-20: 8.5 mg via INTRAOCULAR

## 2011-12-20 MED ORDER — NEOMYCIN-POLYMYXIN-DEXAMETH 0.1 % OP OINT
TOPICAL_OINTMENT | OPHTHALMIC | Status: DC | PRN
  Administered 2011-12-20: 1 via OPHTHALMIC

## 2011-12-20 MED ORDER — LIDOCAINE 3.5 % OP GEL OPTIME - NO CHARGE
OPHTHALMIC | Status: DC | PRN
  Administered 2011-12-20: 1 [drp] via OPHTHALMIC

## 2011-12-20 MED ORDER — CYCLOPENTOLATE HCL 1 % OP SOLN
1.0000 [drp] | OPHTHALMIC | Status: AC
  Administered 2011-12-20 (×3): 1 [drp] via OPHTHALMIC

## 2011-12-20 MED ORDER — LIDOCAINE HCL (PF) 1 % IJ SOLN
INTRAOCULAR | Status: DC | PRN
  Administered 2011-12-20: 13:00:00 via OPHTHALMIC

## 2011-12-20 MED ORDER — MIDAZOLAM HCL 2 MG/2ML IJ SOLN
INTRAMUSCULAR | Status: AC
  Filled 2011-12-20: qty 2

## 2011-12-20 SURGICAL SUPPLY — 32 items

## 2011-12-20 NOTE — Addendum Note (Signed)
Addendum  created 12/20/11 1358 by Jule Economy, CRNA   Modules edited:Charges VN

## 2011-12-20 NOTE — Preoperative (Signed)
Beta Blockers   Reason not to administer Beta Blockers:Not Applicable 

## 2011-12-20 NOTE — Anesthesia Procedure Notes (Signed)
Procedure Name: MAC Date/Time: 12/20/2011 12:19 PM Performed by: Antony Contras, Izen Petz L Pre-anesthesia Checklist: Patient identified, Patient being monitored, Emergency Drugs available, Timeout performed and Suction available Patient Re-evaluated:Patient Re-evaluated prior to inductionOxygen Delivery Method: Nasal cannula

## 2011-12-20 NOTE — Transfer of Care (Signed)
  Anesthesia Post-op Note  Patient: Alexander Hanna  Procedure(s) Performed: Procedure(s) (LRB) with comments: CATARACT EXTRACTION PHACO AND INTRAOCULAR LENS PLACEMENT (IOC) (Left) - CDE: 14.80  Patient Location: Short stay  Anesthesia Type: MAC  Level of Consciousness: awake, alert , oriented and patient cooperative  Airway and Oxygen Therapy: Patient Spontanous Breathing  Post-op Pain: none  Post-op Assessment: Post-op Vital signs reviewed, Patient's Cardiovascular Status Stable, Respiratory Function Stable, Patent Airway, No signs of Nausea or vomiting and Pain level controlled  Post-op Vital Signs: Reviewed and stable  Complications: No apparent anesthesia complications

## 2011-12-20 NOTE — H&P (Signed)
I have reviewed the H&P, the patient was re-examined, and I have identified no interval changes in medical condition and plan of care since the history and physical of record  

## 2011-12-20 NOTE — Anesthesia Postprocedure Evaluation (Signed)
  Anesthesia Post-op Note  Patient: Alexander Hanna  Procedure(s) Performed: Procedure(s) (LRB) with comments: CATARACT EXTRACTION PHACO AND INTRAOCULAR LENS PLACEMENT (IOC) (Left) - CDE: 14.80  Patient Location: Short stay  Anesthesia Type: MAC  Level of Consciousness: awake, alert , oriented and patient cooperative  Airway and Oxygen Therapy: Patient Spontanous Breathing  Post-op Pain: none  Post-op Assessment: Post-op Vital signs reviewed, Patient's Cardiovascular Status Stable, Respiratory Function Stable, Patent Airway, No signs of Nausea or vomiting and Pain level controlled  Post-op Vital Signs: Reviewed and stable  Complications: No apparent anesthesia complications

## 2011-12-20 NOTE — Brief Op Note (Signed)
Pre-Op Dx: Cataract OS Post-Op Dx: Cataract OS Surgeon: Kalisha Keadle Anesthesia: Topical with MAC Surgery: Cataract Extraction with Intraocular lens Implant OS Implant: B&L enVista Specimen: None Complications: None 

## 2011-12-20 NOTE — Anesthesia Preprocedure Evaluation (Signed)
Anesthesia Evaluation  Patient identified by MRN, date of birth, ID band Patient awake    Reviewed: Allergy & Precautions, H&P , NPO status , Patient's Chart, lab work & pertinent test results  Airway Mallampati: II      Dental  (+) Edentulous Upper and Edentulous Lower   Pulmonary neg pulmonary ROS,  breath sounds clear to auscultation        Cardiovascular negative cardio ROS  Rhythm:Regular     Neuro/Psych    GI/Hepatic   Endo/Other  Hypothyroidism   Renal/GU      Musculoskeletal   Abdominal   Peds  Hematology   Anesthesia Other Findings   Reproductive/Obstetrics                           Anesthesia Physical Anesthesia Plan  ASA: II  Anesthesia Plan: MAC   Post-op Pain Management:    Induction: Intravenous  Airway Management Planned: Nasal Cannula  Additional Equipment:   Intra-op Plan:   Post-operative Plan:   Informed Consent: I have reviewed the patients History and Physical, chart, labs and discussed the procedure including the risks, benefits and alternatives for the proposed anesthesia with the patient or authorized representative who has indicated his/her understanding and acceptance.     Plan Discussed with:   Anesthesia Plan Comments:         Anesthesia Quick Evaluation

## 2011-12-21 NOTE — Op Note (Signed)
NAME:  Alexander Hanna, Alexander Hanna NO.:  [id number]  MEDICAL RECORD NO.:  [id number]  LOCATION:  APPO                          FACILITY:  [facility]  PHYSICIAN:  Richardo Hanks, MD       DATE OF BIRTH:  1940/05/16  DATE OF PROCEDURE:  12/20/2011 DATE OF DISCHARGE:  12/20/2011                              OPERATIVE REPORT   PREOPERATIVE DIAGNOSIS:  Combined cataract, left eye, diagnosis code 366.19.  POSTOPERATIVE DIAGNOSES: 1. Combined cataract, left eye, diagnosis code 366.19. 2. Intraoperative floppy iris syndrome, diagnosis code 364.81.  OPERATION PERFORMED:  Phacoemulsification with posterior chamber intraocular lens implantation, left eye.  SURGEON:  Richardo Hanks, MD  ANESTHESIA:  Topical with IV sedation.  OPERATIVE SUMMARY:  In the preoperative area, dilating drops were placed into the left eye.  The patient was then brought into the operating room where he was placed under general anesthesia.  The eye was then prepped and draped.  Beginning with a 75 blade, a paracentesis port was made at the surgeon's 2 o'clock position.  The anterior chamber was then filled with a 1% nonpreserved lidocaine solution with epinephrine.  This was followed by Viscoat to deepen the chamber.  A small fornix-based peritomy was performed superiorly.  Next, a single iris hook was placed through the limbus superiorly.  A 2.4-mm keratome blade was then used to make a clear corneal incision over the iris hook.  A bent cystotome needle and Utrata forceps were used to create a continuous tear capsulotomy.  Hydrodissection was performed using balanced salt solution on a fine cannula.  The lens nucleus was then removed using phacoemulsification in a quadrant cracking technique.  The cortical material was then removed with irrigation and aspiration.  The capsular bag and anterior chamber were refilled with Provisc.  The wound was widened to approximately 3 mm and a posterior chamber intraocular  lens was placed into the capsular bag without difficulty using an [employer] lens injecting system.  A single 10-0 nylon suture was then used to close the incision as well as stromal hydration.  The Provisc was removed from the anterior chamber and capsular bag with irrigation and aspiration.  At this point, the wounds were tested for leak, which were negative.  The anterior chamber remained deep and stable.  The patient tolerated the procedure well.  There were no operative complications, and he awoke from general anesthesia without problem.  No surgical specimens.  Prosthetic device used is a [employer] enVista posterior chamber lens, model MX60, serial number is [id number].          ______________________________ Richardo Hanks, MD     KEH/MEDQ  D:  12/20/2011  T:  12/21/2011  Job:  [id number]

## 2011-12-24 ENCOUNTER — Encounter (HOSPITAL_COMMUNITY): Payer: Self-pay | Admitting: Ophthalmology

## 2012-01-10 ENCOUNTER — Encounter (HOSPITAL_COMMUNITY)
Admission: RE | Admit: 2012-01-10 | Discharge: 2012-01-10 | Payer: Medicare Other | Source: Ambulatory Visit | Admitting: Ophthalmology

## 2012-01-10 ENCOUNTER — Encounter (HOSPITAL_COMMUNITY): Payer: Self-pay

## 2012-01-10 MED ORDER — ONDANSETRON HCL 4 MG/2ML IJ SOLN: 4.0000 mg | Freq: Once | INTRAMUSCULAR | Status: DC | PRN

## 2012-01-10 MED ORDER — FENTANYL CITRATE 0.05 MG/ML IJ SOLN: 25.0000 ug | INTRAMUSCULAR | Status: DC | PRN

## 2012-01-11 MED ORDER — NEOMYCIN-POLYMYXIN-DEXAMETH 3.5-10000-0.1 OP OINT
TOPICAL_OINTMENT | OPHTHALMIC | Status: AC
  Filled 2012-01-11: qty 3.5

## 2012-01-11 MED ORDER — LIDOCAINE HCL 3.5 % OP GEL
OPHTHALMIC | Status: AC
  Filled 2012-01-11: qty 5

## 2012-01-11 MED ORDER — CYCLOPENTOLATE-PHENYLEPHRINE 0.2-1 % OP SOLN
OPHTHALMIC | Status: AC
  Filled 2012-01-11: qty 2

## 2012-01-11 MED ORDER — LIDOCAINE HCL (PF) 1 % IJ SOLN
INTRAMUSCULAR | Status: AC
  Filled 2012-01-11: qty 2

## 2012-01-11 MED ORDER — TETRACAINE HCL 0.5 % OP SOLN
OPHTHALMIC | Status: AC
  Filled 2012-01-11: qty 2

## 2012-01-14 ENCOUNTER — Ambulatory Visit (HOSPITAL_COMMUNITY)
Admission: RE | Admit: 2012-01-14 | Discharge: 2012-01-14 | Disposition: A | Discharge status: Home / Self Care | Payer: Medicare Other | Source: Ambulatory Visit | Attending: Ophthalmology | Admitting: Ophthalmology

## 2012-01-14 ENCOUNTER — Ambulatory Visit (HOSPITAL_COMMUNITY): Payer: Medicare Other | Admitting: Anesthesiology

## 2012-01-14 ENCOUNTER — Encounter (HOSPITAL_COMMUNITY)
Admission: RE | Disposition: A | Discharge status: Home / Self Care | Payer: Self-pay | Source: Ambulatory Visit | Attending: Ophthalmology

## 2012-01-14 ENCOUNTER — Encounter (HOSPITAL_COMMUNITY): Payer: Self-pay

## 2012-01-14 ENCOUNTER — Encounter (HOSPITAL_COMMUNITY): Payer: Self-pay | Admitting: Anesthesiology

## 2012-01-14 DIAGNOSIS — H2589 Other age-related cataract: Secondary | ICD-10-CM | POA: Insufficient documentation

## 2012-01-14 HISTORY — PX: CATARACT EXTRACTION W/PHACO: SHX586

## 2012-01-14 SURGERY — PHACOEMULSIFICATION, CATARACT, WITH IOL INSERTION
Anesthesia: Monitor Anesthesia Care | Site: Eye | Laterality: Right | Wound class: Clean

## 2012-01-14 MED ORDER — MIDAZOLAM HCL 2 MG/2ML IJ SOLN
INTRAMUSCULAR | Status: AC
  Filled 2012-01-14: qty 2

## 2012-01-14 MED ORDER — POVIDONE-IODINE 5 % OP SOLN
OPHTHALMIC | Status: DC | PRN
  Administered 2012-01-14: 1 via OPHTHALMIC

## 2012-01-14 MED ORDER — CYCLOPENTOLATE-PHENYLEPHRINE 0.2-1 % OP SOLN
1.0000 [drp] | OPHTHALMIC | Status: AC
  Administered 2012-01-14 (×3): 1 [drp] via OPHTHALMIC

## 2012-01-14 MED ORDER — LIDOCAINE HCL 3.5 % OP GEL
1.0000 "application " | Freq: Once | OPHTHALMIC | Status: AC
  Administered 2012-01-14: 1 via OPHTHALMIC

## 2012-01-14 MED ORDER — LIDOCAINE HCL (PF) 1 % IJ SOLN
INTRAOCULAR | Status: DC | PRN
  Administered 2012-01-14: 13:00:00 via OPHTHALMIC

## 2012-01-14 MED ORDER — PROVISC 10 MG/ML IO SOLN
INTRAOCULAR | Status: DC | PRN
  Administered 2012-01-14: 8.5 mg via INTRAOCULAR

## 2012-01-14 MED ORDER — EPINEPHRINE HCL 1 MG/ML IJ SOLN
INTRAOCULAR | Status: DC | PRN
  Administered 2012-01-14: 13:00:00

## 2012-01-14 MED ORDER — LACTATED RINGERS IV SOLN
INTRAVENOUS | Status: DC
  Administered 2012-01-14: 1000 mL via INTRAVENOUS

## 2012-01-14 MED ORDER — TETRACAINE HCL 0.5 % OP SOLN
1.0000 [drp] | OPHTHALMIC | Status: AC
  Administered 2012-01-14 (×3): 1 [drp] via OPHTHALMIC

## 2012-01-14 MED ORDER — PHENYLEPHRINE HCL 2.5 % OP SOLN
OPHTHALMIC | Status: AC
  Filled 2012-01-14: qty 2

## 2012-01-14 MED ORDER — NEOMYCIN-POLYMYXIN-DEXAMETH 0.1 % OP OINT
TOPICAL_OINTMENT | OPHTHALMIC | Status: DC | PRN
  Administered 2012-01-14: 1 via OPHTHALMIC

## 2012-01-14 MED ORDER — BSS IO SOLN
INTRAOCULAR | Status: DC | PRN
  Administered 2012-01-14: 500 mL via INTRAOCULAR

## 2012-01-14 MED ORDER — PHENYLEPHRINE HCL 2.5 % OP SOLN
1.0000 [drp] | OPHTHALMIC | Status: AC
  Administered 2012-01-14 (×3): 1 [drp] via OPHTHALMIC

## 2012-01-14 MED ORDER — ONDANSETRON HCL 4 MG/2ML IJ SOLN: 4.0000 mg | Freq: Once | INTRAMUSCULAR | Status: DC | PRN

## 2012-01-14 MED ORDER — MIDAZOLAM HCL 2 MG/2ML IJ SOLN
1.0000 mg | INTRAMUSCULAR | Status: DC | PRN
  Administered 2012-01-14: 2 mg via INTRAVENOUS

## 2012-01-14 MED ORDER — FENTANYL CITRATE 0.05 MG/ML IJ SOLN: 25.0000 ug | INTRAMUSCULAR | Status: DC | PRN

## 2012-01-14 SURGICAL SUPPLY — 31 items
CAPSULAR TENSION RING-AMO (OPHTHALMIC RELATED) IMPLANT
CLOTH BEACON ORANGE TIMEOUT ST (SAFETY) ×2 IMPLANT
EYE SHIELD UNIVERSAL CLEAR (GAUZE/BANDAGES/DRESSINGS) ×2 IMPLANT
GLOVE BIO SURGEON STRL SZ 6.5 (GLOVE) IMPLANT
GLOVE BIOGEL PI IND STRL 6.5 (GLOVE) ×1 IMPLANT
GLOVE BIOGEL PI IND STRL 7.0 (GLOVE) IMPLANT
GLOVE BIOGEL PI IND STRL 7.5 (GLOVE) IMPLANT
GLOVE BIOGEL PI INDICATOR 6.5 (GLOVE) ×1
GLOVE BIOGEL PI INDICATOR 7.0 (GLOVE)
GLOVE BIOGEL PI INDICATOR 7.5 (GLOVE)
GLOVE ECLIPSE 6.5 STRL STRAW (GLOVE) IMPLANT
GLOVE ECLIPSE 7.0 STRL STRAW (GLOVE) IMPLANT
GLOVE ECLIPSE 7.5 STRL STRAW (GLOVE) IMPLANT
GLOVE EXAM NITRILE LRG STRL (GLOVE) IMPLANT
GLOVE EXAM NITRILE MD LF STRL (GLOVE) ×2 IMPLANT
GLOVE SKINSENSE NS SZ6.5 (GLOVE)
GLOVE SKINSENSE NS SZ7.0 (GLOVE)
GLOVE SKINSENSE STRL SZ6.5 (GLOVE) IMPLANT
GLOVE SKINSENSE STRL SZ7.0 (GLOVE) IMPLANT
KIT VITRECTOMY (OPHTHALMIC RELATED) IMPLANT
PAD ARMBOARD 7.5X6 YLW CONV (MISCELLANEOUS) ×2 IMPLANT
PROC W NO LENS (INTRAOCULAR LENS)
PROC W SPEC LENS (INTRAOCULAR LENS)
PROCESS W NO LENS (INTRAOCULAR LENS) IMPLANT
PROCESS W SPEC LENS (INTRAOCULAR LENS) IMPLANT
RING MALYGIN (MISCELLANEOUS) IMPLANT
SIGHTPATH CAT PROC W REG LENS (Ophthalmic Related) ×2 IMPLANT
SYR TB 1ML LL NO SAFETY (SYRINGE) ×2 IMPLANT
TAPE CLOTH SOFT 2X10 (GAUZE/BANDAGES/DRESSINGS) ×2 IMPLANT
VISCOELASTIC ADDITIONAL (OPHTHALMIC RELATED) IMPLANT
WATER STERILE IRR 250ML POUR (IV SOLUTION) ×2 IMPLANT

## 2012-01-14 NOTE — Anesthesia Postprocedure Evaluation (Signed)
  Anesthesia Post-op Note  Patient: Alexander Hanna  Procedure(s) Performed: Procedure(s) (LRB) with comments: CATARACT EXTRACTION PHACO AND INTRAOCULAR LENS PLACEMENT (IOC) (Right) - CDE 19.57  Patient Location: PACU and Short Stay  Anesthesia Type: MAC   Level of Consciousness: awake, alert , oriented and patient cooperative  Airway and Oxygen Therapy: Patient Spontanous Breathing  Post-op Pain: none  Post-op Assessment: Post-op Vital signs reviewed, Patient's Cardiovascular Status Stable, Respiratory Function Stable, RESPIRATORY FUNCTION UNSTABLE, No signs of Nausea or vomiting and Pain level controlled  Post-op Vital Signs: Reviewed and stable  Complications: No apparent anesthesia complications

## 2012-01-14 NOTE — H&P (Signed)
I have reviewed the H&P, the patient was re-examined, and I have identified no interval changes in medical condition and plan of care since the history and physical of record  

## 2012-01-14 NOTE — Brief Op Note (Signed)
Pre-Op Dx: Cataract OD Post-Op Dx: Cataract OD Surgeon: Tonny Branch Anesthesia: Topical with MAC Surgery: Cataract Extraction with Intraocular lens Implant OD Implant: B&L enVista Specimen: None Complications: None

## 2012-01-14 NOTE — Anesthesia Preprocedure Evaluation (Signed)
Anesthesia Evaluation  Patient identified by MRN, date of birth, ID band Patient awake    Reviewed: Allergy & Precautions, H&P , NPO status , Patient's Chart, lab work & pertinent test results  Airway Mallampati: II      Dental  (+) Edentulous Upper and Edentulous Lower   Pulmonary neg pulmonary ROS,  breath sounds clear to auscultation        Cardiovascular negative cardio ROS  Rhythm:Regular     Neuro/Psych    GI/Hepatic   Endo/Other  Hypothyroidism   Renal/GU      Musculoskeletal   Abdominal   Peds  Hematology   Anesthesia Other Findings   Reproductive/Obstetrics                           Anesthesia Physical Anesthesia Plan  ASA: II  Anesthesia Plan: MAC   Post-op Pain Management:    Induction: Intravenous  Airway Management Planned: Nasal Cannula  Additional Equipment:   Intra-op Plan:   Post-operative Plan:   Informed Consent: I have reviewed the patients History and Physical, chart, labs and discussed the procedure including the risks, benefits and alternatives for the proposed anesthesia with the patient or authorized representative who has indicated his/her understanding and acceptance.     Plan Discussed with:   Anesthesia Plan Comments:         Anesthesia Quick Evaluation

## 2012-01-14 NOTE — Transfer of Care (Signed)
Immediate Anesthesia Transfer of Care Note  Patient: Alexander Hanna  Procedure(s) Performed: Procedure(s) (LRB) with comments: CATARACT EXTRACTION PHACO AND INTRAOCULAR LENS PLACEMENT (IOC) (Right) - CDE 19.57  Patient Location: PACU and Short Stay  Anesthesia Type:MAC  Level of Consciousness: awake, alert , oriented and patient cooperative  Airway & Oxygen Therapy: Patient Spontanous Breathing  Post-op Assessment: Report given to PACU RN, Post -op Vital signs reviewed and stable and Patient moving all extremities  Post vital signs: Reviewed and stable  Complications: No apparent anesthesia complications

## 2012-01-15 NOTE — Op Note (Signed)
NAME:  ALBARAA, BUNTAIN NO.:  [id number]  MEDICAL RECORD NO.:  [id number]  LOCATION:  APPO                          FACILITY:  [facility]  PHYSICIAN:  Richardo Hanks, MD       DATE OF BIRTH:  04-16-1940  DATE OF PROCEDURE:  01/14/2012 DATE OF DISCHARGE:  01/14/2012                              OPERATIVE REPORT   PREOPERATIVE DIAGNOSIS:  Combined cataract, right eye, diagnosis code 366.19.  POSTOPERATIVE DIAGNOSIS:  Combined cataract, right eye, diagnosis code 366.19.  OPERATION PERFORMED:  Phacoemulsification with posterior chamber intraocular lens implantation, left eye.  SURGEON:  Franky Macho. [place] Nolton, MD  ANESTHESIA:   Topical with IV sedation.  OPERATIVE SUMMARY:  In the preoperative area, dilating drops were placed into the left eye.  The patient was then brought into the operating room where he was placed under general anesthesia.  The eye was then prepped and draped.  Beginning with a 75 blade, a paracentesis port was made at the surgeon's 2 o'clock position.  The anterior chamber was then filled with a 1% nonpreserved lidocaine solution with epinephrine.  This was followed by Viscoat to deepen the chamber.  A small fornix-based peritomy was performed superiorly.  Next, a single iris hook was placed through the limbus superiorly.  A 2.4-mm keratome blade was then used to make a clear corneal incision over the iris hook.  A bent cystotome needle and Utrata forceps were used to create a continuous tear capsulotomy.  Hydrodissection was performed using balanced salt solution on a fine cannula.  The lens nucleus was then removed using phacoemulsification in a quadrant cracking technique.  The cortical material was then removed with irrigation and aspiration.  The capsular bag and anterior chamber were refilled with Provisc.  The wound was widened to approximately 3 mm and a posterior chamber intraocular lens was placed into the capsular bag without difficulty using an  [employer] lens injecting system.  A single 10-0 nylon suture was then used to close the incision as well as stromal hydration.  The Provisc was removed from the anterior chamber and capsular bag with irrigation and aspiration.  At this point, the wounds were tested for leak, which were negative.  The anterior chamber remained deep and stable.  The patient tolerated the procedure well.  There were no operative complications, and he awoke from general anesthesia without problem.  No surgical specimens.  Prosthetic device used is a [employer] enVista posterior chamber lens, model #MX60, power of 22.0, serial number is [id number].          ______________________________ Richardo Hanks, MD     KEH/MEDQ  D:  01/14/2012  T:  01/15/2012  Job:  [id number]

## 2012-01-16 ENCOUNTER — Encounter (HOSPITAL_COMMUNITY): Payer: Self-pay | Admitting: Ophthalmology

## 2012-09-23 ENCOUNTER — Other Ambulatory Visit (HOSPITAL_COMMUNITY): Payer: Self-pay | Admitting: Family Medicine

## 2012-09-23 DIAGNOSIS — R0981 Nasal congestion: Secondary | ICD-10-CM

## 2012-09-26 ENCOUNTER — Ambulatory Visit (HOSPITAL_COMMUNITY)
Admission: RE | Admit: 2012-09-26 | Discharge: 2012-09-26 | Disposition: A | Discharge status: Home / Self Care | Payer: Medicare Other | Source: Ambulatory Visit | Attending: Family Medicine | Admitting: Family Medicine

## 2012-09-26 DIAGNOSIS — J329 Chronic sinusitis, unspecified: Secondary | ICD-10-CM | POA: Insufficient documentation

## 2012-09-26 DIAGNOSIS — R0981 Nasal congestion: Secondary | ICD-10-CM

## 2012-11-22 ENCOUNTER — Encounter (HOSPITAL_COMMUNITY): Payer: Self-pay | Admitting: *Deleted

## 2012-11-22 ENCOUNTER — Inpatient Hospital Stay (HOSPITAL_COMMUNITY)
Admission: EM | Admit: 2012-11-22 | Discharge: 2012-11-25 | DRG: 552 | Disposition: A | Discharge status: Skilled Nursing Facility | Payer: Medicare Other | Attending: Family Medicine | Admitting: Family Medicine

## 2012-11-22 ENCOUNTER — Emergency Department (HOSPITAL_COMMUNITY): Payer: Medicare Other

## 2012-11-22 DIAGNOSIS — W19XXXA Unspecified fall, initial encounter: Secondary | ICD-10-CM | POA: Diagnosis present

## 2012-11-22 DIAGNOSIS — S32009A Unspecified fracture of unspecified lumbar vertebra, initial encounter for closed fracture: Principal | ICD-10-CM | POA: Diagnosis present

## 2012-11-22 DIAGNOSIS — E669 Obesity, unspecified: Secondary | ICD-10-CM | POA: Diagnosis present

## 2012-11-22 DIAGNOSIS — Y92009 Unspecified place in unspecified non-institutional (private) residence as the place of occurrence of the external cause: Secondary | ICD-10-CM

## 2012-11-22 DIAGNOSIS — M1710 Unilateral primary osteoarthritis, unspecified knee: Secondary | ICD-10-CM | POA: Diagnosis present

## 2012-11-22 DIAGNOSIS — Z6828 Body mass index (BMI) 28.0-28.9, adult: Secondary | ICD-10-CM

## 2012-11-22 DIAGNOSIS — Z96649 Presence of unspecified artificial hip joint: Secondary | ICD-10-CM

## 2012-11-22 DIAGNOSIS — E039 Hypothyroidism, unspecified: Secondary | ICD-10-CM | POA: Diagnosis present

## 2012-11-22 DIAGNOSIS — R296 Repeated falls: Secondary | ICD-10-CM

## 2012-11-22 DIAGNOSIS — M169 Osteoarthritis of hip, unspecified: Secondary | ICD-10-CM

## 2012-11-22 DIAGNOSIS — M48061 Spinal stenosis, lumbar region without neurogenic claudication: Secondary | ICD-10-CM | POA: Diagnosis present

## 2012-11-22 DIAGNOSIS — E785 Hyperlipidemia, unspecified: Secondary | ICD-10-CM | POA: Diagnosis present

## 2012-11-22 DIAGNOSIS — S32010A Wedge compression fracture of first lumbar vertebra, initial encounter for closed fracture: Secondary | ICD-10-CM

## 2012-11-22 DIAGNOSIS — N318 Other neuromuscular dysfunction of bladder: Secondary | ICD-10-CM | POA: Diagnosis present

## 2012-11-22 DIAGNOSIS — M4800 Spinal stenosis, site unspecified: Secondary | ICD-10-CM | POA: Diagnosis present

## 2012-11-22 DIAGNOSIS — I10 Essential (primary) hypertension: Secondary | ICD-10-CM | POA: Diagnosis present

## 2012-11-22 DIAGNOSIS — Z79899 Other long term (current) drug therapy: Secondary | ICD-10-CM

## 2012-11-22 DIAGNOSIS — S32010K Wedge compression fracture of first lumbar vertebra, subsequent encounter for fracture with nonunion: Secondary | ICD-10-CM

## 2012-11-22 DIAGNOSIS — F79 Unspecified intellectual disabilities: Secondary | ICD-10-CM | POA: Diagnosis present

## 2012-11-22 DIAGNOSIS — N400 Enlarged prostate without lower urinary tract symptoms: Secondary | ICD-10-CM

## 2012-11-22 DIAGNOSIS — R262 Difficulty in walking, not elsewhere classified: Secondary | ICD-10-CM

## 2012-11-22 LAB — CBC WITH DIFFERENTIAL/PLATELET
Basophils Absolute: 0 10*3/uL (ref 0.0–0.1)
Basophils Relative: 0 % (ref 0–1)
Eosinophils Absolute: 0.1 10*3/uL (ref 0.0–0.7)
Eosinophils Relative: 1 % (ref 0–5)
HCT: 41.9 % (ref 39.0–52.0)
Hemoglobin: 14.5 g/dL (ref 13.0–17.0)
Lymphocytes Relative: 16 % (ref 12–46)
Lymphs Abs: 1.4 10*3/uL (ref 0.7–4.0)
MCH: 31.7 pg (ref 26.0–34.0)
MCHC: 34.6 g/dL (ref 30.0–36.0)
MCV: 91.5 fL (ref 78.0–100.0)
Monocytes Absolute: 0.8 10*3/uL (ref 0.1–1.0)
Monocytes Relative: 9 % (ref 3–12)
Neutro Abs: 6.2 10*3/uL (ref 1.7–7.7)
Neutrophils Relative %: 73 % (ref 43–77)
Platelets: 177 10*3/uL (ref 150–400)
RBC: 4.58 MIL/uL (ref 4.22–5.81)
RDW: 12.5 % (ref 11.5–15.5)
WBC: 8.5 10*3/uL (ref 4.0–10.5)

## 2012-11-22 LAB — BASIC METABOLIC PANEL
BUN: 14 mg/dL (ref 6–23)
CO2: 25 mEq/L (ref 19–32)
Calcium: 9.2 mg/dL (ref 8.4–10.5)
Chloride: 98 mEq/L (ref 96–112)
Creatinine, Ser: 0.59 mg/dL (ref 0.50–1.35)
GFR calc Af Amer: >90 mL/min (ref >90)
GFR calc non Af Amer: >90 mL/min (ref >90)
Glucose, Bld: 104 mg/dL — ABNORMAL HIGH (ref 70–99)
Potassium: 4.1 mEq/L (ref 3.5–5.1)
Sodium: 132 mEq/L — ABNORMAL LOW (ref 135–145)

## 2012-11-22 LAB — URINALYSIS, ROUTINE W REFLEX MICROSCOPIC
Bilirubin Urine: NEGATIVE
Glucose, UA: NEGATIVE mg/dL
Hgb urine dipstick: NEGATIVE
Ketones, ur: NEGATIVE mg/dL
Leukocytes, UA: NEGATIVE
Nitrite: NEGATIVE
Protein, ur: NEGATIVE mg/dL
Specific Gravity, Urine: 1.01 (ref 1.005–1.030)
Urobilinogen, UA: 0.2 mg/dL (ref 0.0–1.0)
pH: 6.5 (ref 5.0–8.0)

## 2012-11-22 MED ORDER — DUTASTERIDE 0.5 MG PO CAPS
0.5000 mg | ORAL_CAPSULE | Freq: Every day | ORAL | Status: DC
  Administered 2012-11-23 – 2012-11-24 (×2): 0.5 mg via ORAL
  Filled 2012-11-22 (×4): qty 1

## 2012-11-22 MED ORDER — SIMVASTATIN 20 MG PO TABS
20.0000 mg | ORAL_TABLET | Freq: Every day | ORAL | Status: DC
  Administered 2012-11-22 – 2012-11-24 (×3): 20 mg via ORAL
  Filled 2012-11-22 (×3): qty 1

## 2012-11-22 MED ORDER — NIACIN-SIMVASTATIN ER 500-20 MG PO TB24: 1.0000 | ORAL_TABLET | Freq: Every day | ORAL | Status: DC

## 2012-11-22 MED ORDER — ONDANSETRON HCL 4 MG/2ML IJ SOLN: 4.0000 mg | INTRAMUSCULAR | Status: DC | PRN

## 2012-11-22 MED ORDER — POLYETHYLENE GLYCOL 3350 17 G PO PACK: 17.0000 g | PACK | Freq: Every day | ORAL | Status: DC | PRN

## 2012-11-22 MED ORDER — GUAIFENESIN ER 600 MG PO TB12: 600.0000 mg | ORAL_TABLET | Freq: Two times a day (BID) | ORAL | Status: DC | PRN

## 2012-11-22 MED ORDER — NIACIN ER (ANTIHYPERLIPIDEMIC) 500 MG PO TBCR
500.0000 mg | EXTENDED_RELEASE_TABLET | Freq: Every day | ORAL | Status: DC
  Administered 2012-11-23 – 2012-11-24 (×2): 500 mg via ORAL
  Filled 2012-11-22 (×4): qty 1

## 2012-11-22 MED ORDER — TAMSULOSIN HCL 0.4 MG PO CAPS
0.4000 mg | ORAL_CAPSULE | Freq: Every day | ORAL | Status: DC
  Administered 2012-11-23 – 2012-11-24 (×2): 0.4 mg via ORAL
  Filled 2012-11-22 (×2): qty 1

## 2012-11-22 MED ORDER — ONDANSETRON HCL 4 MG/2ML IJ SOLN
4.0000 mg | Freq: Once | INTRAMUSCULAR | Status: AC
  Administered 2012-11-22: 4 mg via INTRAVENOUS
  Filled 2012-11-22: qty 2

## 2012-11-22 MED ORDER — FLUTICASONE PROPIONATE 50 MCG/ACT NA SUSP
2.0000 | Freq: Every day | NASAL | Status: DC | PRN
  Filled 2012-11-22: qty 16

## 2012-11-22 MED ORDER — IBUPROFEN 400 MG PO TABS
400.0000 mg | ORAL_TABLET | Freq: Once | ORAL | Status: AC
  Administered 2012-11-22: 400 mg via ORAL
  Filled 2012-11-22: qty 1

## 2012-11-22 MED ORDER — TRAZODONE HCL 50 MG PO TABS
50.0000 mg | ORAL_TABLET | Freq: Every evening | ORAL | Status: DC | PRN
  Administered 2012-11-24: 50 mg via ORAL
  Filled 2012-11-22: qty 1

## 2012-11-22 MED ORDER — PSEUDOEPHEDRINE HCL 60 MG PO TABS
30.0000 mg | ORAL_TABLET | Freq: Four times a day (QID) | ORAL | Status: DC | PRN
Start: 2012-11-22 — End: 2012-11-25

## 2012-11-22 MED ORDER — DIAZEPAM 5 MG/ML IJ SOLN
2.5000 mg | Freq: Once | INTRAMUSCULAR | Status: AC
  Administered 2012-11-22: 2.5 mg via INTRAVENOUS
  Filled 2012-11-22: qty 2

## 2012-11-22 MED ORDER — ONDANSETRON HCL 4 MG/2ML IJ SOLN: 4.0000 mg | Freq: Three times a day (TID) | INTRAMUSCULAR | Status: DC | PRN

## 2012-11-22 MED ORDER — HYDROMORPHONE HCL PF 1 MG/ML IJ SOLN
0.5000 mg | INTRAMUSCULAR | Status: DC | PRN
  Administered 2012-11-24: 1 mg via INTRAVENOUS
  Administered 2012-11-25: 0.5 mg via INTRAVENOUS
  Filled 2012-11-22 (×3): qty 1

## 2012-11-22 MED ORDER — ENOXAPARIN SODIUM 40 MG/0.4ML ~~LOC~~ SOLN
40.0000 mg | SUBCUTANEOUS | Status: DC
  Administered 2012-11-22 – 2012-11-24 (×3): 40 mg via SUBCUTANEOUS
  Filled 2012-11-22 (×3): qty 0.4

## 2012-11-22 MED ORDER — HYDROMORPHONE HCL PF 1 MG/ML IJ SOLN: 1.0000 mg | INTRAMUSCULAR | Status: DC | PRN

## 2012-11-22 MED ORDER — OXYCODONE HCL 5 MG PO TABS
5.0000 mg | ORAL_TABLET | ORAL | Status: DC | PRN
  Administered 2012-11-25: 5 mg via ORAL
  Filled 2012-11-22: qty 1

## 2012-11-22 MED ORDER — OXYCODONE-ACETAMINOPHEN 5-325 MG PO TABS
1.0000 | ORAL_TABLET | Freq: Once | ORAL | Status: AC
  Administered 2012-11-22: 1 via ORAL
  Filled 2012-11-22: qty 1

## 2012-11-22 MED ORDER — BISACODYL 10 MG RE SUPP: 10.0000 mg | Freq: Every day | RECTAL | Status: DC | PRN

## 2012-11-22 MED ORDER — FLEET ENEMA 7-19 GM/118ML RE ENEM: 1.0000 | ENEMA | Freq: Once | RECTAL | Status: AC | PRN

## 2012-11-22 MED ORDER — ACETAMINOPHEN 325 MG PO TABS: 650.0000 mg | ORAL_TABLET | ORAL | Status: DC | PRN

## 2012-11-22 MED ORDER — EZETIMIBE 10 MG PO TABS
10.0000 mg | ORAL_TABLET | Freq: Every day | ORAL | Status: DC
  Administered 2012-11-23 – 2012-11-25 (×3): 10 mg via ORAL
  Filled 2012-11-22 (×3): qty 1

## 2012-11-22 MED ORDER — PSEUDOEPHEDRINE-GUAIFENESIN ER 60-600 MG PO TB12: 1.0000 | ORAL_TABLET | Freq: Two times a day (BID) | ORAL | Status: DC | PRN

## 2012-11-22 MED ORDER — POTASSIUM CHLORIDE IN NACL 20-0.9 MEQ/L-% IV SOLN
INTRAVENOUS | Status: DC
  Administered 2012-11-22 – 2012-11-25 (×2): via INTRAVENOUS

## 2012-11-22 MED ORDER — METHOCARBAMOL 500 MG PO TABS: 500.0000 mg | ORAL_TABLET | Freq: Four times a day (QID) | ORAL | Status: DC | PRN

## 2012-11-22 MED ORDER — LORATADINE 10 MG PO TABS: 10.0000 mg | ORAL_TABLET | Freq: Every day | ORAL | Status: DC | PRN

## 2012-11-22 MED ORDER — MORPHINE SULFATE 4 MG/ML IJ SOLN
4.0000 mg | INTRAMUSCULAR | Status: DC | PRN
  Administered 2012-11-22: 4 mg via INTRAVENOUS
  Filled 2012-11-22: qty 1

## 2012-11-22 MED ORDER — NAPROXEN 250 MG PO TABS: 500.0000 mg | ORAL_TABLET | Freq: Two times a day (BID) | ORAL | Status: DC | PRN

## 2012-11-22 MED ORDER — LEVOTHYROXINE SODIUM 112 MCG PO TABS
112.0000 ug | ORAL_TABLET | Freq: Every day | ORAL | Status: DC
  Administered 2012-11-23 – 2012-11-25 (×3): 112 ug via ORAL
  Filled 2012-11-22 (×4): qty 1

## 2012-11-22 MED ORDER — DOCUSATE SODIUM 100 MG PO CAPS
100.0000 mg | ORAL_CAPSULE | Freq: Two times a day (BID) | ORAL | Status: DC
  Administered 2012-11-22 – 2012-11-25 (×6): 100 mg via ORAL
  Filled 2012-11-22 (×6): qty 1

## 2012-11-22 NOTE — ED Notes (Signed)
Called to pt's room secondary to decreasing O2 saturation.  Pt placed on 2 LPM [place] to maintain SAO2 > 92%. Pt snoring loudly after medication administration. EDP aware.

## 2012-11-22 NOTE — H&P (Signed)
[facility] History and Physical  Alexander Hanna  [id number]  DOB: [birth year]   DOA: *11/22/2012   PCP:   Maricela Curet, MD   Chief Complaint:  Recurrent falls  HPI: Alexander Hanna is a 72 y.o. male.  Elderly Caucasian gentleman lives in a home with his brother family, history of multiple orthopedic disorder, including spinal stenosis and gait abnormality. Has been having multiple falls over the past few weeks. The patient sustained a fall in the bathroom today reports his legs gave way while he was using a walker. He came to the emergency room and was found to have a compression fracture of L1.  The patient reports chronic burning and tingling of his hand and especially shoulders. And episodic numbness and burning of the legs.  Denies chest pains palpitations dyspnea or lightheadedness  Rewiew of Systems:   All systems negative except as marked bold or noted in the HPI;  Constitutional:    malaise, fever and chills. ;  Eyes:   eye pain, redness and discharge. ; Visually impaired ENMT:   ear pain, hoarseness, nasal congestion, sinus pressure and sore throat. ; Hard of hearing Cardiovascular:    chest pain, palpitations, diaphoresis, dyspnea and peripheral edema.  Respiratory:   cough, hemoptysis, wheezing and stridor. ;  Gastrointestinal:  nausea, vomiting, diarrhea, constipation, abdominal pain, melena, blood in stool, hematemesis, jaundice and rectal bleeding. unusual weight loss..   Genitourinary:    frequency, dysuria, incontinence,flank pain and hematuria; Musculoskeletal:   back pain and neck pain.  swelling and trauma.;  Skin: .  pruritus, rash, abrasions, bruising and skin lesion.; ulcerations Neuro:    headache, lightheadedness and neck stiffness.  weakness, altered level of consciousness, altered mental status, extremity weakness, burning tingling hands feet, involuntary movement, seizure and syncope.  Psych:    anxiety, depression, insomnia, tearfulness, panic  attacks, hallucinations, paranoia, suicidal or homicidal ideation    Past Medical History  Diagnosis Date  . Polio     as a child  . Environmental allergies   . Hypothyroidism   . Overactive bladder   . 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 Cholecystectomy    . Joint replacement      rt anterior hip  . Cataract extraction w/phaco  12/20/2011    Procedure: CATARACT EXTRACTION PHACO AND INTRAOCULAR LENS PLACEMENT (IOC);  Surgeon: Tonny Branch, MD;  Location: [facility];  Service: Ophthalmology;  Laterality: Left;  CDE: 14.80  . Cataract extraction w/phaco  01/14/2012    Procedure: CATARACT EXTRACTION PHACO AND INTRAOCULAR LENS PLACEMENT (IOC);  Surgeon: Tonny Branch, MD;  Location: [facility];  Service: Ophthalmology;  Laterality: Right;  CDE 19.57    Medications:  HOME MEDS: Prior to Admission medications   Medication Sig Start Date End Date Taking? Authorizing Provider  aspirin EC 81 MG tablet Take 81 mg by mouth daily.   Yes Historical Provider, MD  dutasteride (AVODART) 0.5 MG capsule Take 0.5 mg by mouth daily.   Yes Historical Provider, MD  ezetimibe (ZETIA) 10 MG tablet Take 10 mg by mouth daily.   Yes Historical Provider, MD  fluticasone (FLONASE) 50 MCG/ACT nasal spray Place 2 sprays into the nose daily as needed. Allergies   Yes Historical Provider, MD  levothyroxine (LEVOTHROID) 112 MCG tablet Take 112 mcg by mouth daily.   Yes Historical Provider, MD  loratadine (CLARITIN) 10 MG tablet Take 10 mg by  mouth daily as needed. Allergies   Yes Historical Provider, MD  methocarbamol (ROBAXIN) 500 MG tablet Take 500 mg by mouth 2 (two) times daily as needed (muscle spasms.).   Yes Historical Provider, MD  naproxen (NAPROSYN) 500 MG tablet Take 500 mg by mouth 2 (two) times daily as needed (pain).    Yes Historical Provider, MD  niacin-simvastatin (SIMCOR) 500-20 MG 24 hr tablet Take 1 tablet by mouth at bedtime.   Yes  Historical Provider, MD  pseudoephedrine-guaifenesin (MUCINEX D) 60-600 MG per tablet Take 1 tablet by mouth 2 (two) times daily as needed for congestion.   Yes Historical Provider, MD  tamsulosin (FLOMAX) 0.4 MG CAPS capsule Take 0.4 mg by mouth daily after supper.   Yes Historical Provider, MD     Allergies:  Allergies  Allergen Reactions  . Moxifloxacin     Allergy to avalox    Social History:   reports that he has never smoked. He does not have any smokeless tobacco history on file. He reports that he does not drink alcohol or use illicit drugs.  Family History: Family History  Problem Relation Age of Onset  . Lung cancer Mother   . Heart attack Father   . Angina Brother   . Coronary artery disease Brother      Physical Exam: Filed Vitals:   11/22/12 1430 11/22/12 1638 11/22/12 1814 11/22/12 2011  BP: 139/76 132/74 151/74 135/68  Pulse: 87 61 76 68  Temp:    98.4 F (36.9 C)  TempSrc:    Oral  Resp: 18 18 19 18   Height:      Weight:    92.352 kg (203 lb 9.6 oz)  SpO2: 94% 95% 90% 95%   Blood pressure 135/68, pulse 68, temperature 98.4 F (36.9 C), temperature source Oral, resp. rate 18, height 5' 10.5" (1.791 m), weight 92.352 kg (203 lb 9.6 oz), SpO2 95.00%. Body mass index is 28.79 kg/(m^2).   GEN:  Pleasant elderly Caucasian gentleman lying bed in no acute distress; tries to be cooperative with exam PSYCH:  alert and oriented ;  neither anxious nor depressed; affect is appropriate. HEENT: Mucous membranes pink and anicteric; PERRLA; EOM intact; no cervical lymphadenopathy nor thyromegaly or carotid bruit; no JVD; Breasts:: Not examined CHEST WALL: No tenderness CHEST: Normal respiration, clear to auscultation bilaterally HEART: Regular rate and rhythm; no murmurs rubs or gallops BACK: Examination of his back was limited because of pain which was not rolling over onto his side or asking to sit ABDOMEN: Obese, soft non-tender; no masses, no organomegaly, normal  abdominal bowel sounds; no intertriginous candida. Rectal Exam: Not done EXTREMITIES:  age-appropriate arthropathy of the hands and knees; no edema; no ulcerations. Genitalia: not examined PULSES: 2+ and symmetric SKIN: Multiple old bruises of the elbows and legs from the past recurrent falls  CNS: Cranial nerves 2-12 grossly intact no focal lateralizing neurologic deficit   Labs on Admission:  Basic Metabolic Panel:  Recent Labs Lab 11/22/12 1509  NA 132*  K 4.1  CL 98  CO2 25  GLUCOSE 104*  BUN 14  CREATININE 0.59  CALCIUM 9.2   Liver Function Tests: No results found for this basename: AST, ALT, ALKPHOS, BILITOT, PROT, ALBUMIN,  in the last 168 hours No results found for this basename: LIPASE, AMYLASE,  in the last 168 hours No results found for this basename: AMMONIA,  in the last 168 hours CBC:  Recent Labs Lab 11/22/12 1509  WBC 8.5  NEUTROABS 6.2  HGB 14.5  HCT 41.9  MCV 91.5  PLT 177   Cardiac Enzymes: No results found for this basename: CKTOTAL, CKMB, CKMBINDEX, TROPONINI,  in the last 168 hours BNP: No components found with this basename: POCBNP,  D-dimer: No components found with this basename: D-DIMER,  CBG: No results found for this basename: GLUCAP,  in the last 168 hours  Radiological Exams on Admission: Dg Lumbar Spine Complete  11/22/2012   *RADIOLOGY REPORT*  Clinical Data: Fall.  LUMBAR SPINE - COMPLETE 4+ VIEW  Comparison: MRI 04/13/2009.  Findings: Right total hip arthroplasty intact.  Vertebral body alignment is within normal.  There is a mild moderate compression fracture of L1 age indeterminate, but new since 2011. There is mild spondylosis present.  Disc spaces are well preserved.  Facet arthropathy is present over the lower lumbar.  IMPRESSION: Mild moderate L1 compression fracture age indeterminate but new since 2011.  Mild spondylosis of the lumbar spine to include facet arthropathy.   Original Report Authenticated By: Marin Olp, M.D.    Ct Head Wo Contrast  11/22/2012   *RADIOLOGY REPORT*  Clinical Data:  History of fall.  Altered level of consciousness.  CT HEAD WITHOUT CONTRAST CT CERVICAL SPINE WITHOUT CONTRAST  Technique:  Multidetector CT imaging of the head and cervical spine was performed following the standard protocol without intravenous contrast.  Multiplanar CT image reconstructions of the cervical spine were also generated.  Comparison:  Maxillofacial CT 09/26/2012.  CT HEAD  Findings: Study is limited by gross patient motion.  With these limitations in mind, there is mild cerebral and cerebellar atrophy. Patchy areas of decreased attenuation throughout the deep white matter of the cerebral hemispheres bilaterally, favored to reflect mild chronic microvascular ischemic disease. No acute displaced skull fractures are identified.  No acute intracranial abnormality. Specifically, no evidence of acute post-traumatic intracranial hemorrhage, no definite regions of acute/subacute cerebral ischemia, no focal mass, mass effect, hydrocephalus or abnormal intra or extra-axial fluid collections.  The visualized paranasal sinuses and mastoids are well pneumatized.  IMPRESSION: 1.  Despite the mild limitations of today's examination, there is no evidence to suggest significant acute traumatic injury to the head or brain. 2.  Mild cerebral atrophy with mild chronic microvascular ischemic changes in the cerebral white matter.  CT CERVICAL SPINE  Findings: Study is limited by considerable patient motion.  With these limitations in mind, there do not appear to be any acute displaced cervical spine fractures.  Mild exaggeration of normal cervical lordosis is favored to be positional.  Alignment is otherwise anatomic.  Prevertebral soft tissues are normal.  Mild multilevel degenerative disc disease and facet arthropathy. Visualized portions of the upper thorax are unremarkable.  IMPRESSION: 1.  Mildly limited examination demonstrating no definite  evidence to suggest significant acute traumatic injury to the cervical spine.   Original Report Authenticated By: Vinnie Langton, M.D.   Ct Cervical Spine Wo Contrast  11/22/2012   *RADIOLOGY REPORT*  Clinical Data:  History of fall.  Altered level of consciousness.  CT HEAD WITHOUT CONTRAST CT CERVICAL SPINE WITHOUT CONTRAST  Technique:  Multidetector CT imaging of the head and cervical spine was performed following the standard protocol without intravenous contrast.  Multiplanar CT image reconstructions of the cervical spine were also generated.  Comparison:  Maxillofacial CT 09/26/2012.  CT HEAD  Findings: Study is limited by gross patient motion.  With these limitations in mind, there is mild cerebral and cerebellar atrophy. Patchy areas of decreased attenuation throughout the deep white  matter of the cerebral hemispheres bilaterally, favored to reflect mild chronic microvascular ischemic disease. No acute displaced skull fractures are identified.  No acute intracranial abnormality. Specifically, no evidence of acute post-traumatic intracranial hemorrhage, no definite regions of acute/subacute cerebral ischemia, no focal mass, mass effect, hydrocephalus or abnormal intra or extra-axial fluid collections.  The visualized paranasal sinuses and mastoids are well pneumatized.  IMPRESSION: 1.  Despite the mild limitations of today's examination, there is no evidence to suggest significant acute traumatic injury to the head or brain. 2.  Mild cerebral atrophy with mild chronic microvascular ischemic changes in the cerebral white matter.  CT CERVICAL SPINE  Findings: Study is limited by considerable patient motion.  With these limitations in mind, there do not appear to be any acute displaced cervical spine fractures.  Mild exaggeration of normal cervical lordosis is favored to be positional.  Alignment is otherwise anatomic.  Prevertebral soft tissues are normal.  Mild multilevel degenerative disc disease and facet  arthropathy. Visualized portions of the upper thorax are unremarkable.  IMPRESSION: 1.  Mildly limited examination demonstrating no definite evidence to suggest significant acute traumatic injury to the cervical spine.   Original Report Authenticated By: Vinnie Langton, M.D.   Dg Shoulder Left  11/22/2012   *RADIOLOGY REPORT*  Clinical Data: Fall.  Low back pain.  Left shoulder pain.  LEFT SHOULDER - 2+ VIEW  Comparison: No priors.  Findings: Three views of the left shoulder demonstrate no definite acute displaced fracture, subluxation, dislocation, joint or soft tissue abnormality.  Joint space narrowing, subchondral sclerosis and mild osteophyte formation in the left glenohumeral joint compatible with mild osteoarthritis.  IMPRESSION: 1.  No acute radiographic abnormality of the left shoulder. 2.  Mild osteoarthritis of the left glenohumeral joint.   Original Report Authenticated By: Vinnie Langton, M.D.      Assessment/Plan  Active Problems:   SPINAL STENOSIS   Ambulatory dysfunction   Fall   Frequent falls   Compression fracture of L1 lumbar vertebra   Unspecified hypothyroidism  PLAN:  we'll admit this patient for pain and shoulder and supportive care   MRI of the lumbar spine on Monday, and consider referral for kyphoplasty if appropriate   MRI of the cervical spine on Monday, to evaluate for radicular symptoms in the shoulders and fingers Physical therapy evaluation  Other plans as per orders.  Code Status:  full code Family Communication: Plans discuss with patient and  at bedside Disposition Plan: Likely skilled nursing     Kelli Egolf Nocturnist [facility] Pager [phone]   11/22/2012, 9:09 PM

## 2012-11-22 NOTE — ED Provider Notes (Signed)
CSN: [id number]     Arrival date & time 11/22/12  1142 History  This chart was scribed for Alexander Manifold, MD by Roxan Diesel, ED scribe.  This patient was seen in room APA12/APA12 and the patient's care was started at 2:15 PM.   Chief Complaint  Patient presents with  . Back Pain  . Fall    The history is provided by a relative and the patient. No language interpreter was used.    HPI Comments: Alexander Hanna is a 72 y.o. male who presents to the Emergency Department complaining of a fall that occurred this morning.  Brother reports that pt has fallen 3 times over the past several weeks and has been able to pull himself up on those occasions.  However today he again fell while in the bathroom and was not able to pull himself up.  His brother states that he heard pt yelling downstairs and when he went down he found pt on the floor in the bathroom.  Pt told his brother that he fell because his back "gave way."  He denies head impact or LOC.  Presently pt reports constant severe lower back pain, left shoulder pain, bilateral hand pain, and a mild "crick" in the right side of his neck.  He denies headache.  His brother does not know why pt has been falling so often but reports that pt's mobility has been rapidly decreasing over the past month.  He recently changed from walking with a cane to using a walker and his brother notes that "a walker is not sufficient now."  Has been Robaxin and Naprosyn for pain from other recent falls which provide some relief from pain.  Brother notes that pt recently had a right hip replacement.  Pt is not on blood-thinners.  He lives with his brother. 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   PCP is Dr. Cindie Laroche   Past Medical History  Diagnosis Date  . Polio     as a child  . Environmental allergies   . Hypothyroidism   . Overactive bladder   . 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 Cholecystectomy    . Joint replacement      rt anterior hip  . Cataract extraction w/phaco  12/20/2011    Procedure: CATARACT EXTRACTION PHACO AND INTRAOCULAR LENS PLACEMENT (IOC);  Surgeon: Tonny Branch, MD;  Location: [facility];  Service: Ophthalmology;  Laterality: Left;  CDE: 14.80  . Cataract extraction w/phaco  01/14/2012    Procedure: CATARACT EXTRACTION PHACO AND INTRAOCULAR LENS PLACEMENT (IOC);  Surgeon: Tonny Branch, MD;  Location: [facility];  Service: Ophthalmology;  Laterality: Right;  CDE 19.57    Family History  Problem Relation Age of Onset  . Lung cancer Mother   . Heart attack Father   . Angina Brother   . Coronary artery disease Brother     History  Substance Use Topics  . Smoking status: Never Smoker   . Smokeless tobacco: Not on file  . Alcohol Use: No    Review of Systems  All other systems reviewed and are negative.     Allergies  Moxifloxacin  Home Medications   Current Outpatient Rx  Name  Route  Sig  Dispense  Refill  . aspirin EC 81 MG tablet   Oral   Take  81 mg by mouth daily.         Marland Kitchen dutasteride (AVODART) 0.5 MG capsule   Oral   Take 0.5 mg by mouth daily.         Marland Kitchen ezetimibe (ZETIA) 10 MG tablet   Oral   Take 10 mg by mouth daily.         . fluticasone (FLONASE) 50 MCG/ACT nasal spray   Nasal   Place 2 sprays into the nose daily as needed. Allergies         . levothyroxine (LEVOTHROID) 112 MCG tablet   Oral   Take 112 mcg by mouth daily.         Marland Kitchen loratadine (CLARITIN) 10 MG tablet   Oral   Take 10 mg by mouth daily as needed. Allergies         . methocarbamol (ROBAXIN) 500 MG tablet   Oral   Take 500 mg by mouth 2 (two) times daily as needed (muscle spasms.).         Marland Kitchen naproxen (NAPROSYN) 500 MG tablet   Oral   Take 500 mg by mouth 2 (two) times daily as needed (pain).          . niacin-simvastatin (SIMCOR) 500-20 MG 24 hr tablet   Oral   Take  1 tablet by mouth at bedtime.         . pseudoephedrine-guaifenesin (MUCINEX D) 60-600 MG per tablet   Oral   Take 1 tablet by mouth 2 (two) times daily as needed for congestion.         . tamsulosin (FLOMAX) 0.4 MG CAPS capsule   Oral   Take 0.4 mg by mouth daily after supper.          BP 150/85  Pulse 90  Temp(Src) 98.7 F (37.1 C) (Oral)  Resp 20  Ht 5' 10.5" (1.791 m)  Wt 214 lb (97.07 kg)  BMI 30.26 kg/m2  SpO2 96%  Physical Exam  Nursing note and vitals reviewed. Constitutional: He appears well-developed and well-nourished. No distress.  HENT:  Head: Normocephalic and atraumatic.  Hearing aid  Eyes: Conjunctivae are normal. Right eye exhibits no discharge. Left eye exhibits no discharge.  Neck: Neck supple.  Cardiovascular: Normal rate, regular rhythm and normal heart sounds.  Exam reveals no gallop and no friction rub.   No murmur heard. Pulmonary/Chest: Effort normal and breath sounds normal. No respiratory distress.  Abdominal: Soft. He exhibits no distension. There is no tenderness.  Musculoskeletal: He exhibits edema and tenderness.  No bony tenderness to left shoulder Pain to ROM of left shoulder, particularly abduction Full ROM of the large joints without pain otherwise Midline tenderness in upper-to-mid lumbar region Paraspinal tenderness in left lumbar region Symmetric pitting lower extremity edema  Neurological: He is alert.  Slightly hard of hearing, otherwise cranial nerves intact Strength 5/5 upper and lower extremities bilaterally  Skin: Skin is warm and dry.  Old-appearing bruising to left lateral shoulder, left elbow  Psychiatric: He has a normal mood and affect. His behavior is normal. Thought content normal.    ED Course  Procedures (including critical care time)  DIAGNOSTIC STUDIES: Oxygen Saturation is 96% on room air, normal by my interpretation.    COORDINATION OF CARE: 2:30 PM-Discussed treatment plan which includes pain  medication, EKG, labs and imaging with pt's brother at bedside and he agreed to plan.    Labs Review Labs Reviewed  BASIC METABOLIC PANEL - Abnormal; Notable for the following:  Sodium 132 (*)    Glucose, Bld 104 (*)    All other components within normal limits  CBC WITH DIFFERENTIAL  URINALYSIS, ROUTINE W REFLEX MICROSCOPIC    Imaging Review Dg Lumbar Spine Complete  11/22/2012   *RADIOLOGY REPORT*  Clinical Data: Fall.  LUMBAR SPINE - COMPLETE 4+ VIEW  Comparison: MRI 04/13/2009.  Findings: Right total hip arthroplasty intact.  Vertebral body alignment is within normal.  There is a mild moderate compression fracture of L1 age indeterminate, but new since 2011. There is mild spondylosis present.  Disc spaces are well preserved.  Facet arthropathy is present over the lower lumbar.  IMPRESSION: Mild moderate L1 compression fracture age indeterminate but new since 2011.  Mild spondylosis of the lumbar spine to include facet arthropathy.   Original Report Authenticated By: Marin Olp, M.D.   Ct Head Wo Contrast  11/22/2012   *RADIOLOGY REPORT*  Clinical Data:  History of fall.  Altered level of consciousness.  CT HEAD WITHOUT CONTRAST CT CERVICAL SPINE WITHOUT CONTRAST  Technique:  Multidetector CT imaging of the head and cervical spine was performed following the standard protocol without intravenous contrast.  Multiplanar CT image reconstructions of the cervical spine were also generated.  Comparison:  Maxillofacial CT 09/26/2012.  CT HEAD  Findings: Study is limited by gross patient motion.  With these limitations in mind, there is mild cerebral and cerebellar atrophy. Patchy areas of decreased attenuation throughout the deep white matter of the cerebral hemispheres bilaterally, favored to reflect mild chronic microvascular ischemic disease. No acute displaced skull fractures are identified.  No acute intracranial abnormality. Specifically, no evidence of acute post-traumatic intracranial  hemorrhage, no definite regions of acute/subacute cerebral ischemia, no focal mass, mass effect, hydrocephalus or abnormal intra or extra-axial fluid collections.  The visualized paranasal sinuses and mastoids are well pneumatized.  IMPRESSION: 1.  Despite the mild limitations of today's examination, there is no evidence to suggest significant acute traumatic injury to the head or brain. 2.  Mild cerebral atrophy with mild chronic microvascular ischemic changes in the cerebral white matter.  CT CERVICAL SPINE  Findings: Study is limited by considerable patient motion.  With these limitations in mind, there do not appear to be any acute displaced cervical spine fractures.  Mild exaggeration of normal cervical lordosis is favored to be positional.  Alignment is otherwise anatomic.  Prevertebral soft tissues are normal.  Mild multilevel degenerative disc disease and facet arthropathy. Visualized portions of the upper thorax are unremarkable.  IMPRESSION: 1.  Mildly limited examination demonstrating no definite evidence to suggest significant acute traumatic injury to the cervical spine.   Original Report Authenticated By: Vinnie Langton, M.D.   Ct Cervical Spine Wo Contrast  11/22/2012   *RADIOLOGY REPORT*  Clinical Data:  History of fall.  Altered level of consciousness.  CT HEAD WITHOUT CONTRAST CT CERVICAL SPINE WITHOUT CONTRAST  Technique:  Multidetector CT imaging of the head and cervical spine was performed following the standard protocol without intravenous contrast.  Multiplanar CT image reconstructions of the cervical spine were also generated.  Comparison:  Maxillofacial CT 09/26/2012.  CT HEAD  Findings: Study is limited by gross patient motion.  With these limitations in mind, there is mild cerebral and cerebellar atrophy. Patchy areas of decreased attenuation throughout the deep white matter of the cerebral hemispheres bilaterally, favored to reflect mild chronic microvascular ischemic disease. No acute  displaced skull fractures are identified.  No acute intracranial abnormality. Specifically, no evidence of acute post-traumatic intracranial hemorrhage,  no definite regions of acute/subacute cerebral ischemia, no focal mass, mass effect, hydrocephalus or abnormal intra or extra-axial fluid collections.  The visualized paranasal sinuses and mastoids are well pneumatized.  IMPRESSION: 1.  Despite the mild limitations of today's examination, there is no evidence to suggest significant acute traumatic injury to the head or brain. 2.  Mild cerebral atrophy with mild chronic microvascular ischemic changes in the cerebral white matter.  CT CERVICAL SPINE  Findings: Study is limited by considerable patient motion.  With these limitations in mind, there do not appear to be any acute displaced cervical spine fractures.  Mild exaggeration of normal cervical lordosis is favored to be positional.  Alignment is otherwise anatomic.  Prevertebral soft tissues are normal.  Mild multilevel degenerative disc disease and facet arthropathy. Visualized portions of the upper thorax are unremarkable.  IMPRESSION: 1.  Mildly limited examination demonstrating no definite evidence to suggest significant acute traumatic injury to the cervical spine.   Original Report Authenticated By: Vinnie Langton, M.D.   Dg Shoulder Left  11/22/2012   *RADIOLOGY REPORT*  Clinical Data: Fall.  Low back pain.  Left shoulder pain.  LEFT SHOULDER - 2+ VIEW  Comparison: No priors.  Findings: Three views of the left shoulder demonstrate no definite acute displaced fracture, subluxation, dislocation, joint or soft tissue abnormality.  Joint space narrowing, subchondral sclerosis and mild osteophyte formation in the left glenohumeral joint compatible with mild osteoarthritis.  IMPRESSION: 1.  No acute radiographic abnormality of the left shoulder. 2.  Mild osteoarthritis of the left glenohumeral joint.   Original Report Authenticated By: Vinnie Langton, M.D.     MDM   1. Fall, initial encounter   2. Compression fracture of L1 lumbar vertebra, closed, initial encounter   3. Frequent falls   4. Ambulatory dysfunction    72yM with lower back pain. Multiple recent falls. Lives with brother in downstairs apartment. Ambulatory dysfunction at baseline and deteriorating to point where can't walk with a walker. Unclear etiology of falls. Not clearly syncope. W/u significant for l1 compression fx. Likely acute as this corresponds to area of pain/tenderness on exam.     I personally preformed the services scribed in my presence. The recorded information has been reviewed is accurate. Alexander Manifold, MD.    Alexander Manifold, MD 11/26/12 [phone]

## 2012-11-22 NOTE — ED Notes (Signed)
Pt's family member reports pt fell for the 3 rd time this week secondary to back spasms. moderate yellow bruising noted. Pt reports worsening pain after said fall and now has tingling in bilateral finger tips. Pt states can ambulate with assist at this time. Denies bowel and bladder dysfunction. NAD noted at this time.

## 2012-11-22 NOTE — ED Notes (Signed)
Pt states that he fell this am when his back "gave out" causing him to fall into the bathroom fall, per family with pt, pt has had several falls lately but is usually able to get himself up but today he was not able to get up on his own, pt c/o lower back pain.

## 2012-11-23 LAB — COMPREHENSIVE METABOLIC PANEL
AST: 42 U/L — ABNORMAL HIGH (ref 0–37)
Albumin: 3.4 g/dL — ABNORMAL LOW (ref 3.5–5.2)
Calcium: 8.4 mg/dL (ref 8.4–10.5)
Chloride: 103 mEq/L (ref 96–112)
Creatinine, Ser: 0.57 mg/dL (ref 0.50–1.35)
Total Protein: 6.2 g/dL (ref 6.0–8.3)

## 2012-11-23 LAB — CBC
MCV: 92.7 fL (ref 78.0–100.0)
Platelets: 179 10*3/uL (ref 150–400)
RDW: 12.6 % (ref 11.5–15.5)
WBC: 7.2 10*3/uL (ref 4.0–10.5)

## 2012-11-23 NOTE — Progress Notes (Signed)
NAME:  Alexander Hanna, CHOY NO.:  [id number]  MEDICAL RECORD NO.:  [id number]  LOCATION:  A327                          FACILITY:  [facility]  PHYSICIAN:  Unk Lightning, MDDATE OF BIRTH:  1940/05/03  DATE OF PROCEDURE: DATE OF DISCHARGE:                                PROGRESS NOTE   The patient is a 72 year old, mentally retarded white male, who lives with his brother and is well taken care with history of significant arthritis, spinal stenosis documented in 2011 by MRI of L3-4 and 5. Subsequent total right hip replacement with good positioning of prosthesis in left and right knee arthritis.  The patient was admitted with increasing falls due to he states leg weakness over the preceding several weeks but worsening over the last week.  The patient is limited in his ability to articulate which leg is weaker and plans right now are to visualize the lumbosacral spine for progression of spinal stenosis of possible radicular origins of pain, however, right hip prosthesis may obscure this being in the same general vicinity.  We will discuss this with Radiology which test of choice to order.  I will also obtain neurologic consultation to be certain.  There is no significant peripheral neuropathy and consideration given to nerve conduction velocity studies bilaterally.  We will defer this to Neurology.  GENERAL:  He is hemodynamically stable. VITAL SIGNS:  Blood pressure 137/72, temperature 98, pulse 94 and regular, respiratory rate is 18, hemoglobin 14. LUNGS:  Clear.  No rales, wheeze, or rhonchi. HEART:  Regular rhythm.  No murmurs, gallops, or rubs. ABDOMEN:  Soft, nontender.  Bowel sounds normoactive.  No significant leg weakness detectable.  Plantars are downgoing.  The plan is progressive weakness in the face of MRI documented spinal stenosis in 2011 with right hip prosthesis.  Subsequent to that, we will visualize the lumbosacral spine with CT or MRI depending  on radiologist's choice and obtain Neurology consult.     Unk Lightning, MD     RMD/MEDQ  D:  11/23/2012  T:  11/23/2012  Job:  [id number]

## 2012-11-23 NOTE — Progress Notes (Signed)
[id number]

## 2012-11-23 NOTE — Evaluation (Signed)
Physical Therapy Evaluation Patient Details Name: Alexander Hanna MRN: [id number] DOB: 1940-04-03 Today's Date: 11/23/2012 Time: 1300-1330 PT Time Calculation (min): 30 min  PT Assessment / Plan / Recommendation History of Present Illness   Pt is a 72 yo male who lives with his brother.  He normally walks with a walker.  He fell yesterday and sustained a compression and now has decreased mobility.  Clinical Impression  Pt has limited mobility and will need SNF placement to improve independence and safety of walking.    PT Assessment  Patient needs continued PT services    Follow Up Recommendations  SNF    Does the patient have the potential to tolerate intense rehabilitation    no  Barriers to Discharge  none      Equipment Recommendations  None recommended by PT    Recommendations for Other Services   OT  Frequency Min 5X/week    Precautions / Restrictions Precautions Precautions: Fall Restrictions Weight Bearing Restrictions: No   Pertinent Vitals/Pain 5/10 with activity      Mobility  Bed Mobility Bed Mobility: Rolling Right;Rolling Left Rolling Right: 3: Mod assist (secondary to pain) Rolling Left: 3: Mod assist (secondary to pain) Transfers Transfers: Not assessed Ambulation/Gait Ambulation/Gait Assistance: Not tested (comment)    Exercises General Exercises - Lower Extremity Ankle Circles/Pumps: AROM;Strengthening;Both;10 reps Quad Sets: AROM;Strengthening;Both;10 reps Gluteal Sets: AROM;Strengthening;Both;10 reps Heel Slides: AROM;Both;10 reps Hip ABduction/ADduction: AROM;Both;10 reps   PT Diagnosis: Difficulty walking  PT Problem List: Decreased strength;Decreased activity tolerance;Pain;Decreased mobility PT Treatment Interventions: Gait training;Therapeutic activities;Therapeutic exercise     PT Goals(Current goals can be found in the care plan section) Acute Rehab PT Goals PT Goal Formulation: With patient Time For Goal Achievement:  11/26/12 Potential to Achieve Goals: Good  Visit Information  Last PT Received On: 11/23/12       Prior [place] expects to be discharged to:: Skilled nursing facility Living Arrangements: Spouse/significant other Prior Function Level of Independence: Independent with assistive device(s) Communication Communication: HOH Dominant Hand: Right    Cognition  Cognition Arousal/Alertness: Awake/alert Overall Cognitive Status: Within Functional Limits for tasks assessed    Extremity/Trunk Assessment Lower Extremity Assessment Lower Extremity Assessment: Generalized weakness   Balance    End of Session PT - End of Session Activity Tolerance: Patient limited by pain Patient left: in bed;with call bell/phone within reach  GP     RUSSELL,CINDY 11/23/2012, 1:30 PM

## 2012-11-24 ENCOUNTER — Inpatient Hospital Stay (HOSPITAL_COMMUNITY): Payer: Medicare Other

## 2012-11-24 NOTE — Clinical Social Work Placement (Signed)
Clinical Social Work Department CLINICAL SOCIAL WORK PLACEMENT NOTE 11/24/2012  Patient:  Alexander Hanna, Alexander Hanna  Account Number:  [id number] Admit date:  11/22/2012  Clinical Social Worker:  Benay Pike, LCSW  Date/time:  11/24/2012 03:50 PM  Clinical Social Work is seeking post-discharge placement for this patient at the following level of care:   SKILLED NURSING   (*CSW will update this form in Epic as items are completed)   11/24/2012  Patient/family provided with [place] Department of Clinical Social Work's list of facilities offering this level of care within the geographic area requested by the patient (or if unable, by the patient's family).  11/24/2012  Patient/family informed of their freedom to choose among providers that offer the needed level of care, that participate in Medicare, Medicaid or managed care program needed by the patient, have an available bed and are willing to accept the patient.  11/24/2012  Patient/family informed of [facility]' ownership interest in [facility], as well as of the fact that they are under no obligation to receive care at this facility.  PASARR submitted to [employer] on  PASARR number received from [place] on   FL2 transmitted to all facilities in geographic area requested by pt/family on  11/24/2012 FL2 transmitted to all facilities within larger geographic area on   Patient informed that his/her managed care company has contracts with or will negotiate with  certain facilities, including the following:     Patient/family informed of bed offers received:   Patient chooses bed at  Physician recommends and patient chooses bed at    Patient to be transferred to  on   Patient to be transferred to facility by   The following physician request were entered in Epic:   Additional Comments: Pt has existing pasarr.  Benay Pike, [place]

## 2012-11-24 NOTE — Evaluation (Signed)
Occupational Therapy Evaluation Patient Details Name: GENOVEVO GURUNG MRN: [id number] DOB: June 21, 1940 Today's Date: 11/24/2012 Time: [id number] OT Time Calculation (min): 18 min  OT Assessment / Plan / Recommendation     Clinical Impression   Patient is a 72 y/o male s/p L1 compression fx presenting to acute OT with deficits below. Patient will benefit from OT services to increase ADL performance, functional transfers, and BUE strength and endurance. Recommend SNF at d/c.     OT Assessment  Patient needs continued OT Services    Follow Up Recommendations  SNF       Equipment Recommendations   (Defer to next venue)       Frequency  Min 2X/week    Precautions / Restrictions Precautions Precautions: Fall   Pertinent Vitals/Pain 2/10 low back pain    ADL  Lower Body Dressing: Performed;Minimal assistance Where Assessed - Lower Body Dressing: Supported sitting Toilet Transfer: Performed;Moderate assistance Toilet Transfer Method: Stand pivot Science writer:  (to recliner) Equipment Used: Gait belt Transfers/Ambulation Related to ADLs: Patient transfers with Mod Assist without AD. Patient presents with a forward flexed position at the waist.     OT Diagnosis: Generalized weakness;Acute pain  OT Problem List: Decreased strength;Pain;Impaired balance (sitting and/or standing) OT Treatment Interventions: Self-care/ADL training;Therapeutic exercise;DME and/or AE instruction;Manual therapy;Patient/family education;Balance training   OT Goals(Current goals can be found in the care plan section)    Visit Information  Last OT Received On: 11/24/12       Prior [place] expects to be discharged to:: Skilled nursing facility Living Arrangements: Other relatives (brother) Additional Comments: Pt reports that he was Mod I with all BADL at home.  Prior Function Level of Independence: Independent with assistive  device(s) Communication Communication: HOH Dominant Hand: Right         Vision/Perception Vision - History Baseline Vision: No visual deficits Patient Visual Report: No change from baseline   Cognition  Cognition Arousal/Alertness: Awake/alert Behavior During Therapy: WFL for tasks assessed/performed Overall Cognitive Status: Within Functional Limits for tasks assessed    Extremity/Trunk Assessment Upper Extremity Assessment Upper Extremity Assessment: Generalized weakness     Mobility Transfers Transfers: Sit to Stand;Stand to Sit Sit to Stand: 3: Mod assist Stand to Sit: 3: Mod assist           End of Session OT - End of Session Equipment Utilized During Treatment: Gait belt Activity Tolerance: Patient tolerated treatment well Patient left: in chair;with call bell/phone within reach;with chair alarm set    [employer], OTR/L,CBIS   11/24/2012, 9:28 AM

## 2012-11-24 NOTE — Progress Notes (Signed)
[id number] 

## 2012-11-24 NOTE — Clinical Social Work Psychosocial (Signed)
Clinical Social Work Department BRIEF PSYCHOSOCIAL ASSESSMENT 11/24/2012  Patient:  Alexander Hanna, Alexander Hanna     Account Number:  [id number]     Admit date:  11/22/2012  Clinical Social Worker:  Alexander Hanna  Date/Time:  11/24/2012 03:35 PM  Referred by:  CSW  Date Referred:  11/24/2012 Referred for  SNF Placement   Other Referral:   Interview type:  Family Other interview type:   Alexander Hanna- brother    PSYCHOSOCIAL DATA Living Status:  FAMILY Admitted from facility:   Level of care:   Primary support name:  Alexander Hanna Primary support relationship to patient:  SIBLING Degree of support available:   supportive    CURRENT CONCERNS Current Concerns  Post-Acute Placement   Other Concerns:    SOCIAL WORK ASSESSMENT / PLAN CSW attempted to meet with pt, but he was sleeping. Spoke with pt's brother, Alexander Hanna in waiting room. Alexander Hanna reports pt has lived with him and his wife for the past 26 years. 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 At baseline, pt is oriented. Pt lives in the basement of the home and generally is fairly independent. Alexander Hanna and his wife prepare all meals, but pt can ambulate using a rolling walker or cane and dresses and bathes himself. Pt fell 10 days ago and then again on Friday. At that point, Alexander Hanna felt it was necessary to bring pt to hospital. Awaiting neuro consult. CSW discussed PT recommendation of SNF. Facility list provided. Alexander Hanna would like for pt to be short term and return home, but they are unable to care for pt at his current condition. Aware of Medicare coverage/criteria. Pt has been a resident at [facility] in 2012 for rehab.   Assessment/plan status:  Psychosocial Support/Ongoing Assessment of Needs Other assessment/ plan:   Information/referral to community resources:   SNF list    PATIENT'S/FAMILY'S RESPONSE TO PLAN OF CARE: Pt unable to discuss plan of care at this time. Pt's brother requests CSW to initiate bed search for rehab. Will follow up with bed offers  when available.       Alexander Hanna, [place]

## 2012-11-24 NOTE — Progress Notes (Signed)
Physical Therapy Treatment Patient Details Name: Alexander Hanna MRN: [id number] DOB: Apr 30, 1940 Today's Date: 11/24/2012 Time: [id number] PT Time Calculation (min): 20 min  PT Assessment / Plan / Recommendation     PT Comments   Pt is limited by the fear of pain. Pt states that pain comes and goes but it increases when he stands. He is willing to stand and walk to [facility]. He does this well with min assist and vc's to improve safety. Pt completes therex well with minimal difficulty after initial instruction.  Plan Current plan remains appropriate    Pertinent Vitals/Pain 5/10 intermittent     Mobility  Bed Mobility Bed Mobility: Rolling Right;Right Sidelying to Sit Rolling Right: 3: Mod assist Right Sidelying to Sit: 3: Mod assist Transfers Transfers: Sit to Stand;Stand to Sit Sit to Stand: 4: Min assist;From bed;From toilet Stand to Sit: 4: Min assist;To bed;To chair/3-in-1 Ambulation/Gait Ambulation/Gait Assistance: 4: Min assist Ambulation Distance (Feet): 3 Feet Assistive device: Rolling walker Gait Pattern: Trunk flexed;Decreased stride length General Gait Details: Pt is willing to walk to [facility] but states that he is unable to walk farther secondary to pain.    Exercises General Exercises - Lower Extremity Ankle Circles/Pumps: AROM;Strengthening;Both;10 reps Quad Sets: AROM;Strengthening;Both;10 reps Heel Slides: AROM;Both;10 reps    Visit Information  Last PT Received On: 11/24/12    Subjective Data  Pt states that he doesn't want to stand because it increases his pain.  End of Session PT - End of Session Equipment Utilized During Treatment: Gait belt Activity Tolerance: Patient limited by pain Patient left: in bed;with call bell/phone within reach;with bed alarm set   Rachelle Hora, PTA  11/24/2012, 2:36 PM

## 2012-11-25 ENCOUNTER — Inpatient Hospital Stay
Admission: RE | Admit: 2012-11-25 | Discharge: 2012-12-25 | Disposition: A | Discharge status: Home / Self Care | Payer: Medicare Other | Source: Ambulatory Visit | Attending: Internal Medicine | Admitting: Internal Medicine

## 2012-11-25 LAB — HOMOCYSTEINE: Homocysteine: 7.6 umol/L (ref 4.0–15.4)

## 2012-11-25 LAB — RPR: RPR Ser Ql: NONREACTIVE

## 2012-11-25 LAB — VITAMIN B12: Vitamin B-12: 461 pg/mL (ref 211–911)

## 2012-11-25 MED ORDER — OXYCODONE HCL 5 MG PO TABS: 5.0000 mg | ORAL_TABLET | ORAL | Status: DC | PRN

## 2012-11-25 MED ORDER — TRAZODONE HCL 50 MG PO TABS: 50.0000 mg | ORAL_TABLET | Freq: Every evening | ORAL | Status: DC | PRN

## 2012-11-25 NOTE — Progress Notes (Signed)
Occupational Therapy Treatment Patient Details Name: Alexander Hanna MRN: [id number] DOB: 1940-07-20 Today's Date: 11/25/2012 Time: [id number] OT Time Calculation (min): 43 min  OT Assessment / Plan / Recommendation     OT comments  Patient agreeable to participate in tx session. Patient transferred to chair requiring max verbal cues for hand placement and technique. When standing, patient had a tendency to stand with a posterior lean.                  Progress towards OT Goals Progress towards OT goals: Progressing toward goals  Plan Discharge plan remains appropriate    Precautions / Restrictions Precautions Precautions: Fall   Pertinent Vitals/Pain No complaints.    ADL  Toilet Transfer: Performed;Minimal assistance Toilet Transfer Method:  (ambulating 3-4 feet) Toilet Transfer Equipment:  (to recliner) Equipment Used: Gait belt;Rolling walker Transfers/Ambulation Related to ADLs: Patient transfers with [place] with RW. Patient required constant verbal cues for hand placement and technique. Patient also required physical assist moving RW in correct direction.        OT Goals(current goals can now be found in the care plan section)    Visit Information  Last OT Received On: 11/25/12 Assistance Needed: +1          Cognition  Cognition Arousal/Alertness: Awake/alert Behavior During Therapy: [facility] for tasks assessed/performed Overall Cognitive Status: Within Functional Limits for tasks assessed    Mobility  Transfers Transfers: Sit to Stand;Stand to Sit Sit to Stand: 4: Min assist;With upper extremity assist;From bed Stand to Sit: 4: Min assist;With armrests;To chair/3-in-1    Exercises  General Exercises - Upper Extremity Shoulder Flexion: Strengthening;Both;15 reps;Seated;Theraband Theraband Level (Shoulder Flexion): Level 2 (Red) Shoulder Extension: Strengthening;Both;15 reps;Seated;Theraband Theraband Level (Shoulder Extension): Level 2 (Red) Shoulder  Horizontal ABduction: Strengthening;Both;15 reps;Seated;Theraband Theraband Level (Shoulder Horizontal Abduction): Level 2 (Red) Shoulder Horizontal ADduction: Strengthening;Both;15 reps;Seated;Theraband Theraband Level (Shoulder Horizontal Adduction): Level 2 (Red) Elbow Flexion: Strengthening;Both;15 reps;Seated;Theraband Theraband Level (Elbow Flexion): Level 2 (Red) Elbow Extension: Strengthening;Both;15 reps;Seated;Theraband Theraband Level (Elbow Extension): Level 2 (Red)       End of Session OT - End of Session Equipment Utilized During Treatment: Gait belt;Rolling walker Activity Tolerance: Patient tolerated treatment well Patient left: in chair;with call bell/phone within reach    [employer], OTR/L,CBIS   11/25/2012, 12:13 PM

## 2012-11-25 NOTE — Progress Notes (Signed)
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  He states his legs have been getting weaker, and he is stumbling.  He had a right hip arthroplasty. Likewise, __________ some hypertension, hyperlipidemia, mostly well controlled.  Blood pressure 150/74, temperature 97.6, pulse 86 and regular, respirations 16, and O2 sat 91%.  MRI of L-spine reveals multiple herniated nucleus pulposus at several levels with mild to moderate foraminal stenosis on right and left sides.  He had a history of spinal stenosis reportedly in 2011 by MRI along with __________ mentioned on this report.  We will obtain a Neurology consult __________ to see if there is any objective weakness of right and left legs which may be attributable to his lumbosacral abnormalities.  Perhaps, nerve conduction velocity test may elucidate the issue in an objective manner.     Unk Lightning, MD     RMD/MEDQ  D:  11/24/2012  T:  11/25/2012  Job:  [id number]

## 2012-11-25 NOTE — Discharge Summary (Signed)
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  The patient has had progressive weakness of both lower extremities, stumbling and falling, worse, so more pronounced on the last 4 months.  He was seen and admitted to the hospital.  No focal deficits were found.  Repeat MRI of the lumbosacral spine revealed compression fracture of L1 which is subacute, and multiple herniated nucleus pulposus with mild-to-moderate foraminal stenosis right and left side.  He was seen in consultation by Neurology to see if this was a factor in his progressive gait disturbance.  Neurology felt it was multifactorial, and some of it could be medicine effect, recent compression fracture, chronic pain, history of right hip arthroplasty, progressive weakness from aging as well as herniated nucleus pulposus on multiple lumbosacral levels.  It was felt may try physical therapy in a structured environment skilled nursing facility.  The patient and his brother agree to this and subsequently discharged on the following medicines, oxycodone 5 mg p.o. t.i.d. p.r.n. trazodone 50 mg p.o. at bedtime, aspirin 81 mg p.o. daily, Avodart 0.5 mg p.o. daily, Zetia 10 mg p.o. daily.  Flonase nasal spray 50 mcg in each nostril daily, Synthroid 112 mcg p.o. daily, Claritin 10 mg p.o. daily, Naprosyn 500 mg p.o. b.i.d. p.r.n., Symbicort 500-20 mg p.o. daily,  Mucinex D 1 q.12 h. p.r.n. p.o. and Flomax 0.4 mg p.o. daily.  He should follow up with aggressive physical therapy for strengthening and ambulation and see how his clinical course progresses.     Unk Lightning, MD     RMD/MEDQ  D:  11/25/2012  T:  11/25/2012  Job:  [id number]

## 2012-11-25 NOTE — Consult Note (Signed)
[place] A. Merlene Laughter, MD     [email or web address]          Alexander Hanna is an 72 y.o. male.   ASSESSMENT/PLAN: The patient most likely has a multifactorial gait impairment from multiple different causes with no clear single cause sedation. Potential etiologies includes aging, fractured lumbar spine, medication effect , mild cervical myelopathy and the possibility of postpolio syndrome although unlikely. The patient workup is reviewed. I would add additional blood  testing. I agree with physical and occupational therapy.   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 The patient presents with a subacute onset to last month or so of multiple falling point the patient does have some baseline gait impairment. He uses a cane handle walker at times. However, last month he is followed several times. The patient has significant impairments and speech and insight which limits the interview. The patient however does not report having any focal weakness . He does report having numbness of the upper extremities bilaterally and neck pain full stop he denies headache. He does not report having any GI or GU symptoms. There is no headache , chest pressure. The workup shows that the patient has had the L1 fracture each indeterminate. The review systems otherwise unremarkable.   GENERAL: Pleasant patient who is in no acute distress.   HEENT: Neck is supple. Head is normocephalic.   ABDOMEN: Soft  EXTREMITIES: No edema   BACK: Normal alignment with only mild soreness on palpation.    SKIN: Normal by inspection.    MENTAL STATUS: The patient is awake and alert full stop he has severe dysarthria which limits the valley tion. Again, he seemed to have limited insight point   CRANIAL NERVES: Pupils are equal, round and reactive to light and accommodation; extra ocular movements are full, there is no significant  nystagmus; visual fields are full; upper and lower facial muscles are normal in strength and symmetric, there is no flattening of the nasolabial folds; tongue is midline; uvula is midline; shoulder elevation is normal.  MOTOR: Patient seemed to have normal strength throughout but tone is slightly increased. Bulk is normal.  COORDINATION: Left finger to nose is normal, right finger to nose is normal, No rest tremor; no intention tremor; no postural tremor; no bradykinesia.  REFLEXES: Deep tendon reflexes are symmetrical and normal. Plantar responses are equivocal bilaterally.  SENSATION: Normal to light touch.    Past Medical History  Diagnosis Date  . Polio     as a child  . Environmental allergies   . Hypothyroidism   . Overactive bladder   . 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 Cholecystectomy    . Joint replacement      rt anterior hip  . Cataract extraction w/phaco  12/20/2011    Procedure: CATARACT EXTRACTION PHACO AND INTRAOCULAR LENS PLACEMENT (IOC);  Surgeon: Tonny Branch, MD;  Location: [facility];  Service: Ophthalmology;  Laterality: Left;  CDE: 14.80  . Cataract extraction w/phaco  01/14/2012    Procedure: CATARACT EXTRACTION PHACO AND INTRAOCULAR LENS PLACEMENT (IOC);  Surgeon: Tonny Branch, MD;  Location: [facility];  Service: Ophthalmology;  Laterality: Right;  CDE 19.57    Family History  Problem Relation Age of Onset  . Lung cancer Mother   . Heart attack Father   .  Angina Brother   . Coronary artery disease Brother     Social History:  reports that he has never smoked. He does not have any smokeless tobacco history on file. He reports that he does not drink alcohol or use illicit drugs.  Allergies:  Allergies  Allergen Reactions  . Moxifloxacin     Allergy to avalox    Medications: Prior to Admission medications   Medication Sig Start Date End Date Taking? Authorizing Provider  aspirin EC 81  MG tablet Take 81 mg by mouth daily.   Yes Historical Provider, MD  dutasteride (AVODART) 0.5 MG capsule Take 0.5 mg by mouth daily.   Yes Historical Provider, MD  ezetimibe (ZETIA) 10 MG tablet Take 10 mg by mouth daily.   Yes Historical Provider, MD  fluticasone (FLONASE) 50 MCG/ACT nasal spray Place 2 sprays into the nose daily as needed. Allergies   Yes Historical Provider, MD  levothyroxine (LEVOTHROID) 112 MCG tablet Take 112 mcg by mouth daily.   Yes Historical Provider, MD  loratadine (CLARITIN) 10 MG tablet Take 10 mg by mouth daily as needed. Allergies   Yes Historical Provider, MD  methocarbamol (ROBAXIN) 500 MG tablet Take 500 mg by mouth 2 (two) times daily as needed (muscle spasms.).   Yes Historical Provider, MD  naproxen (NAPROSYN) 500 MG tablet Take 500 mg by mouth 2 (two) times daily as needed (pain).    Yes Historical Provider, MD  niacin-simvastatin (SIMCOR) 500-20 MG 24 hr tablet Take 1 tablet by mouth at bedtime.   Yes Historical Provider, MD  pseudoephedrine-guaifenesin (MUCINEX D) 60-600 MG per tablet Take 1 tablet by mouth 2 (two) times daily as needed for congestion.   Yes Historical Provider, MD  tamsulosin (FLOMAX) 0.4 MG CAPS capsule Take 0.4 mg by mouth daily after supper.   Yes Historical Provider, MD    Scheduled Meds: . docusate sodium  100 mg Oral BID  . dutasteride  0.5 mg Oral Daily  . enoxaparin (LOVENOX) injection  40 mg Subcutaneous Q24H  . ezetimibe  10 mg Oral Daily  . levothyroxine  112 mcg Oral QAC breakfast  . simvastatin  20 mg Oral QHS   And  . niacin  500 mg Oral QHS  . tamsulosin  0.4 mg Oral QPC supper   Continuous Infusions: . 0.9 % NaCl with KCl 20 mEq / L 50 mL/hr at 11/25/12 0122   PRN Meds:.acetaminophen, bisacodyl, fluticasone, guaiFENesin, HYDROmorphone (DILAUDID) injection, loratadine, methocarbamol, naproxen, ondansetron (ZOFRAN) IV, oxyCODONE, polyethylene glycol, pseudoephedrine, traZODone    Blood pressure 131/82, pulse 86,  temperature 97.9 F (36.6 C), temperature source Oral, resp. rate 20, height 5' 10.5" (1.791 m), weight 93.1 kg (205 lb 4 oz), SpO2 94.00%.   No results found for this or any previous visit (from the past 48 hour(s)).  Mr Lumbar Spine Wo Contrast  11/24/2012   *RADIOLOGY REPORT*  Clinical Data: Low back pain.  Spinal stenosis.  Progressive falls and leg weakness.  Abnormal lumbar spine radiograph.  MRI LUMBAR SPINE WITHOUT CONTRAST  Technique:  Multiplanar and multiecho pulse sequences of the lumbar spine were obtained without intravenous contrast.  Comparison: Lumbar spine radiographs 11/22/2012.  MRI lumbar spine 04/13/2009.  Findings: Normal signal is present in the conus medullaris which terminates at L1, within normal limits.  The L1 compression fractures acute with a large fluid cleft in the anterior to thirds of the vertebral body.  Edema is present throughout the remainder vertebral body.  There is significant loss centrally  within maximal cortical light of 13 mm compared to 24 mm at the L2 level.  There is slight retropulsion of bone at the superior endplate and minimal disc bulging of the T12-L1 disc.  There is no significant stenosis.  Cystic changes have progressed in the kidneys bilaterally.  No focal parenchymal masses evident.  Limited imaging of the abdomen is otherwise unremarkable.  L1-2:  Negative.  L2-3:  Mild disc bulging and facet hypertrophy is present.  There is no significant stenosis.  L3-4:  A mild broad-based disc herniation is present.  Moderate facet hypertrophy and ligamentum flavum thickening is noted.  Mild lateral recess and foraminal narrowing is evident bilaterally, right greater than left.  L4-5:  A leftward disc herniation is present.  Mild to moderate facet hypertrophy is evident bilaterally.  This results and moderate left and mild right lateral recess narrowing.  Mild foraminal narrowing is present bilaterally.  There is mild progression since the prior exam.  L5-S1:  A  rightward disc herniation is present.  Moderate facet hypertrophy is worse on the right.  Moderate right and mild left lateral recess and foraminal stenosis is stable.  IMPRESSION:  1.  The L1 compression fracture is acute / subacute with at least 50% loss of height and a large cystic cleft. 2.  Slight retropulsion of bone along the superior endplate of L1 without significant spinal stenosis. 3.  Mild lateral recess and foraminal narrowing bilaterally at L3-4 is worse on the right.  This is progressed slightly since the prior exam. 4.  Progression of moderate left lateral recess narrowing at L4-5. 5.  Mild foraminal and right lateral recess narrowing at L4-5 is slightly worse as well. 6.  Moderate right and mild left lateral recess and foraminal stenosis at L5-S1 is not significantly changed.   Original Report Authenticated By: [place], M.D.        Channelle Bottger A. Merlene Laughter, M.D.  Diplomate, Tax adviser of Psychiatry and Neurology ( Neurology). 11/25/2012, 9:17 AM

## 2012-11-25 NOTE — Clinical Social Work Note (Signed)
CSW discussed SNF and bed offers with pt. He is agreeable to [facility]. Also notified pt's brother who is pleased. [facility] notified of bed acceptance as well as MD. Awaiting stability for d/c.   Benay Pike, [place]

## 2012-11-25 NOTE — Discharge Summary (Signed)
[id number] 

## 2012-11-25 NOTE — Clinical Social Work Placement (Signed)
Clinical Social Work Department CLINICAL SOCIAL WORK PLACEMENT NOTE 11/25/2012  Patient:  Alexander Hanna, Alexander Hanna  Account Number:  [id number] Admit date:  11/22/2012  Clinical Social Worker:  Benay Pike, LCSW  Date/time:  11/24/2012 03:50 PM  Clinical Social Work is seeking post-discharge placement for this patient at the following level of care:   SKILLED NURSING   (*CSW will update this form in Epic as items are completed)   11/24/2012  Patient/family provided with [place] Department of Clinical Social Work's list of facilities offering this level of care within the geographic area requested by the patient (or if unable, by the patient's family).  11/24/2012  Patient/family informed of their freedom to choose among providers that offer the needed level of care, that participate in Medicare, Medicaid or managed care program needed by the patient, have an available bed and are willing to accept the patient.  11/24/2012  Patient/family informed of [facility]' ownership interest in [facility], as well as of the fact that they are under no obligation to receive care at this facility.  PASARR submitted to EDS on  PASARR number received from [place] on   FL2 transmitted to all facilities in geographic area requested by pt/family on  11/24/2012 FL2 transmitted to all facilities within larger geographic area on   Patient informed that his/her managed care company has contracts with or will negotiate with  certain facilities, including the following:     Patient/family informed of bed offers received:  11/25/2012 Patient chooses bed at [facility] Physician recommends and patient chooses bed at  [facility]  Patient to be transferred to  on   Patient to be transferred to facility by   The following physician request were entered in Epic:   Additional Comments: Pt has existing pasarr.  Benay Pike, [place]

## 2012-11-25 NOTE — Clinical Social Work Note (Signed)
Pt d/c today to [facility]. Pt, brother, and facility aware and agreeable. Pt to transfer with RN. D/C summary faxed.   Alexander Hanna, [place]

## 2012-11-25 NOTE — Progress Notes (Signed)
Patient discharged to [facility] called and given to Fredda Hammed LPN, vital signs stable,no c/o pain or discomfort  Noted.Accompanied by staff to facility. Patients brother was also accompanied him.

## 2012-11-25 NOTE — Clinical Social Work Placement (Signed)
Clinical Social Work Department CLINICAL SOCIAL WORK PLACEMENT NOTE 11/25/2012  Patient:  Alexander Hanna, Alexander Hanna  Account Number:  [id number] Admit date:  11/22/2012  Clinical Social Worker:  Benay Pike, LCSW  Date/time:  11/24/2012 03:50 PM  Clinical Social Work is seeking post-discharge placement for this patient at the following level of care:   SKILLED NURSING   (*CSW will update this form in Epic as items are completed)   11/24/2012  Patient/family provided with [place] Department of Clinical Social Work's list of facilities offering this level of care within the geographic area requested by the patient (or if unable, by the patient's family).  11/24/2012  Patient/family informed of their freedom to choose among providers that offer the needed level of care, that participate in Medicare, Medicaid or managed care program needed by the patient, have an available bed and are willing to accept the patient.  11/24/2012  Patient/family informed of [facility]' ownership interest in [facility], as well as of the fact that they are under no obligation to receive care at this facility.  PASARR submitted to EDS on  PASARR number received from [place] on   FL2 transmitted to all facilities in geographic area requested by pt/family on  11/24/2012 FL2 transmitted to all facilities within larger geographic area on   Patient informed that his/her managed care company has contracts with or will negotiate with  certain facilities, including the following:     Patient/family informed of bed offers received:  11/25/2012 Patient chooses bed at [facility] Physician recommends and patient chooses bed at  [facility]  Patient to be transferred to [facility] on  11/25/2012 Patient to be transferred to facility by RN  The following physician request were entered in Epic:   Additional Comments: Pt has existing pasarr.   Benay Pike, [place]

## 2012-11-25 NOTE — Progress Notes (Signed)
Physical Therapy Treatment Patient Details Name: Alexander Hanna MRN: [id number] DOB: [birth year] Today's Date: 11/25/2012 Time: [id number] PT Time Calculation (min): 18 min  PT Assessment / Plan / Recommendation  PT Comments   Pt states that he just got into chair and he had done some walking in his room. He wishes to remain in chair but is agreeable to completing therex. Pt completes therex well with minimal difficulty after initial instruction. Pt is able to verbalize the importance of exercising and walking to maintain strength.  Progress towards PT Goals    Plan Current plan remains appropriate    Pertinent Vitals/Pain No pain at rest.    Exercises General Exercises - Lower Extremity Ankle Circles/Pumps: AROM;Strengthening;Both;10 reps Quad Sets: AROM;Strengthening;Both;10 reps Hip ABduction/ADduction: AROM;Both;10 reps Straight Leg Raises: AROM;Both;10 reps    Visit Information  Last PT Received On: 11/25/12    Subjective Data  Pt states that he just got into his chair.  End of Session PT - End of Session Equipment Utilized During Treatment: Gait belt Activity Tolerance: Patient tolerated treatment well Patient left: in chair;with chair alarm set;with call bell/phone within reach  Rachelle Hora, PTA  11/25/2012, 11:51 AM

## 2012-11-26 ENCOUNTER — Non-Acute Institutional Stay (SKILLED_NURSING_FACILITY): Payer: Medicare Other | Admitting: Internal Medicine

## 2012-11-26 DIAGNOSIS — M48061 Spinal stenosis, lumbar region without neurogenic claudication: Secondary | ICD-10-CM

## 2012-11-26 DIAGNOSIS — J449 Chronic obstructive pulmonary disease, unspecified: Secondary | ICD-10-CM

## 2012-11-26 DIAGNOSIS — E039 Hypothyroidism, unspecified: Secondary | ICD-10-CM

## 2012-11-26 DIAGNOSIS — J4489 Other specified chronic obstructive pulmonary disease: Secondary | ICD-10-CM

## 2012-11-26 DIAGNOSIS — R269 Unspecified abnormalities of gait and mobility: Secondary | ICD-10-CM

## 2012-11-26 NOTE — Progress Notes (Signed)
Patient ID: Alexander Hanna, male   DOB: 1941-02-02, 72 y.o.   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 He has a history of significant gait impairment and has a history of lumbar spinal stenosis and a right hip arthroplasty performed within the last 2 years he has had progressive weakness of both lower extremities stumbling and falling more so over the last 4 months. Reviewing medical records suggests the problem has been there since at least 2012 for. He was seen and admitted to the hospital. No focal deficits were found. Repeat MRI of the lumbar sacral spine revealed compression fractures of L1 which was subacute and multiple herniated nucleus pulposus with mild to moderate foraminal stenosis right and left side. He was seen in consultation by neurology to see if this was a factor in his progressive gait disturbance. Neurology apparently felt the gait decline was multifactorial. He was recommended for physical therapy in a skilled facility. Beyond this I really have no additional information on the progression of this problem. He has had a recent MRI of his LS-spine, CT scan of his head and cervical spine [see results below]. His B12 level is more  CT HEAD WITHOUT CONTRAST CT CERVICAL SPINE WITHOUT CONTRAST   Technique:  Multidetector CT imaging of the head and cervical spine was performed following the standard protocol without intravenous contrast.  Multiplanar CT image reconstructions of the cervical spine were also generated.   Comparison:  Maxillofacial CT 09/26/2012.   CT HEAD   Findings: Study is limited by gross patient motion.  With these limitations in mind, there is mild cerebral and cerebellar atrophy. Patchy areas of decreased attenuation throughout the deep  white matter of the cerebral hemispheres bilaterally, favored to reflect mild chronic microvascular ischemic disease. No acute displaced skull fractures are identified.  No acute intracranial abnormality. Specifically, no evidence of acute post-traumatic intracranial hemorrhage, no definite regions of acute/subacute cerebral ischemia, no focal mass, mass effect, hydrocephalus or abnormal intra or extra-axial fluid collections.  The visualized paranasal sinuses and mastoids are well pneumatized.   IMPRESSION: 1.  Despite the mild limitations of today's examination, there is no evidence to suggest significant acute traumatic injury to the head or brain. 2.  Mild cerebral atrophy with mild chronic microvascular ischemic changes in the cerebral white matter.   CT CERVICAL SPINE   Findings: Study is limited by considerable patient motion.  With these limitations in mind, there do not appear to be any acute displaced cervical spine fractures.  Mild exaggeration of normal cervical lordosis is favored to be positional.  Alignment is otherwise anatomic.  Prevertebral soft tissues are normal.  Mild multilevel degenerative disc disease and facet arthropathy. Visualized portions of the upper thorax are unremarkable.   IMPRESSION: 1.  Mildly limited examination demonstrating no definite evidence to suggest significant acute traumatic injury to the cervical spine.   Clinical Data: Low back pain.  Spinal stenosis.  Progressive falls and leg weakness.  Abnormal lumbar spine radiograph.   MRI LUMBAR SPINE WITHOUT CONTRAST   Technique:  Multiplanar and multiecho pulse sequences of the lumbar spine were obtained without intravenous contrast.   Comparison: Lumbar spine radiographs 11/22/2012.  MRI lumbar spine 04/13/2009.   Findings: Normal signal is present in the conus medullaris which terminates at L1, within normal  limits.  The L1 compression fractures acute with a large fluid cleft in  the anterior to thirds of the vertebral body.  Edema is present throughout the remainder vertebral body.  There is significant loss centrally within maximal cortical light of 13 mm compared to 24 mm at the L2 level.   There is slight retropulsion of bone at the superior endplate and minimal disc bulging of the T12-L1 disc.  There is no significant stenosis.   Cystic changes have progressed in the kidneys bilaterally.  No focal parenchymal masses evident.  Limited imaging of the abdomen is otherwise unremarkable.   L1-2:  Negative.   L2-3:  Mild disc bulging and facet hypertrophy is present.  There is no significant stenosis.   L3-4:  A mild broad-based disc herniation is present.  Moderate facet hypertrophy and ligamentum flavum thickening is noted.  Mild lateral recess and foraminal narrowing is evident bilaterally, right greater than left.   L4-5:  A leftward disc herniation is present.  Mild to moderate facet hypertrophy is evident bilaterally.  This results and moderate left and mild right lateral recess narrowing.  Mild foraminal narrowing is present bilaterally.  There is mild progression since the prior exam.   L5-S1:  A rightward disc herniation is present.  Moderate facet hypertrophy is worse on the right.  Moderate right and mild left lateral recess and foraminal stenosis is stable.   IMPRESSION:   1.  The L1 compression fracture is acute / subacute with at least 50% loss of height and a large cystic cleft. 2.  Slight retropulsion of bone along the superior endplate of L1 without significant spinal stenosis. 3.  Mild lateral recess and foraminal narrowing bilaterally at L3-4 is worse on the right.  This is progressed slightly since the prior exam. 4.  Progression of moderate left lateral recess narrowing at L4-5. 5.  Mild foraminal and right lateral recess narrowing at L4-5 is slightly worse as well. 6.  Moderate right and mild left lateral recess and  foraminal stenosis at L5-S1 is not significantly changed.       Past Medical History  Diagnosis Date  . Polio     as a child  . Environmental allergies   . Hypothyroidism   . Overactive bladder ?BPH   . 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 Cholecystectomy    . Joint replacement      rt anterior hip  . Cataract extraction w/phaco  12/20/2011    Procedure: CATARACT EXTRACTION PHACO AND INTRAOCULAR LENS PLACEMENT (IOC);  Surgeon: Tonny Branch, MD;  Location: [facility];  Service: Ophthalmology;  Laterality: Left;  CDE: 14.80  . Cataract extraction w/phaco  01/14/2012    Procedure: CATARACT EXTRACTION PHACO AND INTRAOCULAR LENS PLACEMENT (IOC);  Surgeon: Tonny Branch, MD;  Location: [facility];  Service: Ophthalmology;  Laterality: Right;  CDE 19.57   Medications; oxycodone 5 mg by mouth 3 times a day when necessary, trazodone 50 mg by mouth at bedtime, ASA 81 by mouth daily, Avodart 0.5 daily, said he 10 mg by mouth daily, Flonase 50 mcg in each nostril daily, Synthroid 112 mcg by mouth daily, Claritin 10 mg by mouth daily, Naprosyn 500 mg by mouth twice a day when necessary, Symbicort 500/20 daily.   History   Social History  . Marital Status: Single    Spouse Name:  N/A    Number of Children: N/A  . Years of Education: N/A   Occupational History  . Not on file.   Social History Main Topics  . Smoking status: Never Smoker   . Smokeless tobacco: Not on file  . Alcohol Use: No  . Drug Use: No  . Sexual Activity: Not Currently   Other Topics Concern  . Not on file   Social History Narrative  . Apparently lives with brother between here and [place]. I am uncertain about functional status or exact ADL abilities   Family History  Problem Relation Age of Onset  . Lung cancer Mother   . Heart attack Father   . Angina Brother   .  Coronary artery disease Brother    Review of systems; this is very difficult to do to severe bilateral hearing loss, however the patient states that he has pain in both his upper neck and lower back. Respiratory; he does not describe shortness of breath Cardiac no clear chest pain Abdomen no clear abdominal pain or diarrhea GU clutches the urinal close to him therefore I think he might have urinary frequency Musculoskeletal; he does complain of lower back discomfort and upper neck discomfort. Beyond this I am really unable to characterize this   Physical examination; Vitals; pulse 77, respirations 16 O2 sat 95% on room air Gen.; the patient does not appear to be in any distress HEENT; no oral lesions were seen. Tongue is fissured and dry. Severe bilateral hearing loss with bilateral hearing aids Respiratory; shallow air entry bilaterally, no wheezing worker breathing is normal Cardiac; S1-S2 normal, no gallops, no murmurs, jugular venous pressure is not elevated Abdomen; no liver no spleen, no tenderness, no masses GU; bladder is not distended no CVA tenderness Extremities; peripheral pulp pulses are palpably normal. Some degree of venous stasis. Neurologic; cranial nerves within normal limits, there is some degree of increased tone in both upper extremity. I would think this is more of an upper motor neuron form. There is no pronator drift. His reflexes are symmetric absent at the ankle jerks. I think both his plantar response is extensor. Perhaps a mild Hoffmans reflex on the right Gait; he can bring himself to a sitting position with assistance. Standing required maximal assistance of one person he can barely support his weight.  Mental status; he certainly has some knowledge base. However he said the year was 2004 and that initially he lives in [place] however he corrected himself. I suspect this is more of a chronic cognitive issue  Impression/plan #1 progressive gait ataxia; at this  point I do not have the neurologist note from the hospital. I would expect that this is more likely to be an upper motor neuron issue than anything to do with his LS spine at least at the bedside although there is no doubt that this could be contributing. There is no evidence of lower motor neuron findings in his lower extremities. I would wonder about significant cervical spine compression. I note he had a CT scan however this may need an MRI. There is of course the differential diagnosis to this. B12 level is normal.  #2 polio as a child however I see no evidence of anything that would look like post polio syndrome #3 hypothyroidism on replacement. He will need his TSH checked. #4 hyperlipidemia #5 question COPD #6 question urinary frequency secondary to BPH. I did not check his prostate today. I will do a bladder ultrasound. #7 chronic cognitive impairment; unless  I have history to suggest to the contrary I suspect this is lifelong #8 subacute fracture of L1 which I am assuming is traumatic

## 2012-12-05 ENCOUNTER — Non-Acute Institutional Stay (SKILLED_NURSING_FACILITY): Payer: Medicare Other | Admitting: Internal Medicine

## 2012-12-05 DIAGNOSIS — K625 Hemorrhage of anus and rectum: Secondary | ICD-10-CM

## 2012-12-05 DIAGNOSIS — K5900 Constipation, unspecified: Secondary | ICD-10-CM

## 2012-12-05 DIAGNOSIS — E039 Hypothyroidism, unspecified: Secondary | ICD-10-CM

## 2012-12-05 NOTE — Progress Notes (Signed)
Patient ID: Alexander Hanna, male   DOB: [birth year], 72 y.o.   MRN: [id number]  This is an acute visit.  Level of care skilled.  Facility [facility].  Chief complaint-acute visit secondary to constipation.  Descriptors illness.  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  Does have a history of gait impairment and is here for strengthening and rehabilitation.  His gait impairment is thought to be multifactorial.  Nursing staff reports the patient has not had a bowel movement in a couple days  And think  he's constipated.  He feels he is constipated as well is not complaining of any nausea or vomiting or abdominal discomfort but is concerned about his bowel movements  He is on oxycodone as needed for pain.  I do not see that he is on any bowel agents.  Family medical social history as been reviewed per history and physical on 11/26/2012.  Medications have been reviewed per MAR.  Review of systems.  Somewhat limited but he is not complaining of any nausea or vomiting abdominal pain but is concerned about constipation as she's not really complaining of any muscle skeletal pain today chest pain or shortness of breath.  Physical exam.  Temperature 97.7 pulse 63 respirations 18 blood pressure 110/65.  In general this is a pleasant elderly male in no distress.  His skin is warm and dry.  Chest is clear to auscultation with somewhat shallow [place].  Heart is regular rate and rhythm without murmur gallop or rub.  Abdomen is slightly distended apparently this is his baseline it is soft with positive bowel sounds slightly hypoactive-this does not appear to be tender at all.  Rectal-I did do a digital exam he does appear to have a significant amount of  Hard  stool near the rectal vault  Neurologic.  Cranial nerves appeared to be intact he is able does stand with assistance by  one-person.  Labs.  12/01/2012.  WBC 6.9 hemoglobin 13.9 platelets 250.  Sodium 138 potassium 4.2 BUN 22 creatinine 0.63.  Liver function tests within normal limits except alkaline phosphatase minimally elevated at 129.  Assessment and plan.  #1 constipation-I did speak with nursing and they articulate and an enema.  Also will have to start Colace 100 mg twice a day and monitor this.      Assessment and plan.  #1- suspected constipation-. Have spoken to nursing staff-appears he does have an impaction we will give him an enema and monitor results.  Addendum.  I did speak with nursing apparently the enema did have decent results-- they did notice a small amount of blood  in the stool t  -hey said the stool was very hard--and was certainly not any gross rectal bleeding.  I was able to examine the patient shortly thereafter there was a scant amount of blood around the rectal opening-there are suspicions this could be due to the hard bowel movement but will go ahead and order blood work for tomorrow he had a recent CBC which showed stability with a hemoglobin in the 13-14 range would like to keep an eye on this.. I did review his medical records in EPIC--I do not see a significant history of GI issues or rectal bleeding. I do note he is on aspirin I suspect for chronic anticoagulation also has Naprosyn when necessary but apparently does not take this often at all --Will DC the Naprosyn when necessary as  well as hold aspirin until lab results are known tomorrow  Again suspect this is likely trauma from the hard bowel movement but will monitor-alsoguaic stools x3  We'll start Colace 100 mg twice a day for constipation hold for diarrhea.  #2-hypothyroidism TSH was mildly elevated in the hospital he is on 112 mcg Will recheck updated TSH he may have to have his dose of Synthroid increased  [id number]-of note greater than 30 minutes spent assessing patient..reviewing hospital  records.Marland Kitchent-and reassessing patient as well as formulating plan of care and discussing his status with nursing staff x2--

## 2012-12-06 ENCOUNTER — Ambulatory Visit (HOSPITAL_COMMUNITY): Payer: Medicare Other | Attending: Internal Medicine

## 2012-12-06 DIAGNOSIS — J9819 Other pulmonary collapse: Secondary | ICD-10-CM | POA: Insufficient documentation

## 2012-12-06 DIAGNOSIS — D72829 Elevated white blood cell count, unspecified: Secondary | ICD-10-CM | POA: Insufficient documentation

## 2012-12-07 ENCOUNTER — Non-Acute Institutional Stay (SKILLED_NURSING_FACILITY): Payer: Medicare Other | Admitting: Internal Medicine

## 2012-12-07 DIAGNOSIS — K625 Hemorrhage of anus and rectum: Secondary | ICD-10-CM

## 2012-12-07 DIAGNOSIS — D72829 Elevated white blood cell count, unspecified: Secondary | ICD-10-CM | POA: Insufficient documentation

## 2012-12-07 DIAGNOSIS — E039 Hypothyroidism, unspecified: Secondary | ICD-10-CM

## 2012-12-07 DIAGNOSIS — E871 Hypo-osmolality and hyponatremia: Secondary | ICD-10-CM

## 2012-12-07 NOTE — Progress Notes (Signed)
Patient ID: Alexander Hanna, male   DOB: 1940/09/27, 72 y.o.   MRN: [id number]  This is an acute visit.  Level of care skilled care  Facility [facility].  Chief complaint-acute visit followup constipation-question rectal bleeding-question leukocytosis.  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  Has a history of gait impairment he is here for strengthening and rehabilitation.  When I saw him late last week he had constipation issues and hard stool and did have a small amount of rectal bleeding apparently after a hard bowel movement.  We did start him on Colace 100 mg twice a day.  This appears to have improved his now having regular bowel movements and they are not as hard.  We have ordered guaiac test stools x3-I also checked him again today and the guaiac was negative.  His aspirin was held yesterday this has been restarted secondary suspicions this was a small amount of bleeding because of trauma from the hard bowel movement.  We also ordered lab work his hemoglobin is stable hovering around 13.  Of note an incidental finding on the lab done yesterday showed an elevated white count of 13.5 with elevated granulocytes.  We did order a chest x-ray which only showed atelectasis.   Labs were ordered again for today and this shows normalization with a white count only of 6.5 granulocytes within normal limits I suspect there was some lab issue here.  He shows no signs of infection no cough shortness of breath the urine is pending although again I think this was more of a lab situation it appears.  Of note we also ordered a TSH was is history of hypothyroidism he is on Synthroid TSH was elevated at 5.2.  Vital signs continued to be stable he says he's feeling better he appears to be happier that he is now on a regular bowel regimen.  Family medical social history has been  reviewed per history and physical on 11/26/2012.  Medications have been reviewed.  Review of systems.  The somewhat limited but he is not complaining of any abdominal discomfort shortness of breath cough dysuria.  He says he is feeling better and appears his bowel movements are now more regular.  Physical exam.  Temperature is 97.9 pulse 73 respirations 20 blood pressure taken manually was 112/76.  In general this is a pleasant elderly male in no distress sitting comfortably in his wheelchair.  The skin is warm and dry.  Eyes pupils appear reactive to light extraocular movements intact sclera and conjunctiva are clear.  Chest is clear to auscultation with no labored breathing.--Somewhat shallow air entry  Heart regular rate and rhythm without murmur gallop or rub  Abdomen is slightly distended it is soft nontender with active bowel sounds.  Rectal-I did do a guaiac test digitally and it was negative for occult blood.  Neurologic is grossly intact he is able to stand will need assistance however.  Labs.  12/07/2012.  CBC 6.5 hemoglobin 13.0 platelets of 252.  12/06/2012.  WBC 13.5 hemoglobin 14.1 platelets 301.  Sodium 131 potassium 4.2 BUN 16 creatinine 0.62.  TSH as 5.207.  Assessment and plan.  #1-question rectal bleeding-it appears this was probably more one time, related secondary to the hard bowel movement-his hemoglobin is relatively stable per chart review it appears his baseline hovers in the 13-14 area-level is a bit down today compared to yesterday however I suspect  this is with normal lab variation-will recheck this tomorrow just to be safe-hemoglobin actually yesterday after the episode with bleeding was actually a bit improved from the previous level   #2-leukocytosis-again suspect this was a lab variation he does not show signs of active infection-- white count has normalized it appears---c ontinue to monitor.  #3-hypothyroidism TSH is elevated--- will  increase Synthroid to 125 mcg a day and recheck TSH in 4 weeks.  #4-hyponatremia--he does have a history of mild hyponatremia this appears to be relatively baseline will continue to monitor   [id number]-of note greater than 30 minutes spent assessing patient and coordinating and formulating a plan of care for numerous diagnoses

## 2012-12-18 ENCOUNTER — Non-Acute Institutional Stay (SKILLED_NURSING_FACILITY): Payer: Medicare Other | Admitting: Internal Medicine

## 2012-12-18 DIAGNOSIS — K625 Hemorrhage of anus and rectum: Secondary | ICD-10-CM

## 2012-12-18 DIAGNOSIS — K5900 Constipation, unspecified: Secondary | ICD-10-CM

## 2012-12-18 NOTE — Progress Notes (Signed)
Patient ID: Alexander Hanna, male   DOB: [birth year], 72 y.o.   MRN: [id number]  This is an acute visit.  Level of care skilled.  Facility [facility].  Chief complaint-acute visit followup rectal bleeding.  History of present illness.  Patient is a pleasant elderly male here for rehabilitation secondary to significant weakness along with some cognitive impairment.  Couple weeks ago he had a small amount of rectal bleeding after having a hard bowel movement.  There've been no further reports of bleeding I did testing for occult blood  and it was negative I did order guaic stools x3 and actually one on the 22nd did come back positive the others have been negative--spoke with his nurse today and apparently the positive came shortly after he had the episode with the hard stool the other 2 have been negative and my  digital exam also yielded negative later that week..  It looks like his hemoglobin has been stable although we will need to recheck this.  Nursing staff does not report any issues I did hold his aspirin temporarily but when I tested him for blood and it was negative it was restarted  It looks like he is on aspirin for chronic anticoagulation I do not see a specific diagnosis for.  Family medical social history as been reviewed per history and physical on 11/26/2012.  Medications been reviewed per MAR.  Of note we did start Colace for constipation apparently this is helping.  Review of systems Limited secondary to cognitive issues. He says he feels well past does not complaining of any pain.  Resp---no complaints shortness breath or cough.  Cardiac is not complaining of chest pain or palpitations.  GI-apparently constipation is not an issue now  .  Physical exam.  Temperature is 98.0 pulse 60 respirations 20 blood pressure 134/74.  Gen. pleasant elderly male in no distress sitting completely in his wheelchair.  Skin is warm and dry do not note any increased bruising or  bleeding.  Chest is clear to auscultation.  Heart is regular rate and rhythm somewhat distant heart sounds -he has trace lower extremity edema    abdomen is somewhat obese soft nontender with active bowel soundsis appears to be baseline.    .  Labs.  12/08/2012.  WBC 6.6 hemoglobin 13.2 platelets 267  12/06/2012.  Sodium 131 potassium 4.2 BUN 16 creatinine 0.62.  Assessment and plan.  #1-rectal bleeding--.  clinically he appears stable-aspirin as been restarted I did speak with his brother via phone-apparently he's had a colonoscopy in the past although the date is uncertain-certainly the within the last 5-10 years.  Apparently this was negative according to his brother apparently this was done by Dr. Laural Golden- I did attempt to review this in Epic but I do not see any entry I suspect this was done several years ago.   Will update a CBC tomorrow to make sure this  Suspect the one guaiac positive stool was done shortly after the bowel movement-subsequently ones have been negative  Clinically he appears to be doing well--.  #2-constipation-this appears fairly resolved he is now on Colace  [id number]    .

## 2012-12-23 ENCOUNTER — Non-Acute Institutional Stay (SKILLED_NURSING_FACILITY): Payer: Medicare Other | Admitting: Internal Medicine

## 2012-12-23 DIAGNOSIS — N400 Enlarged prostate without lower urinary tract symptoms: Secondary | ICD-10-CM

## 2012-12-23 DIAGNOSIS — IMO0002 Reserved for concepts with insufficient information to code with codable children: Secondary | ICD-10-CM

## 2012-12-23 DIAGNOSIS — E039 Hypothyroidism, unspecified: Secondary | ICD-10-CM

## 2012-12-23 DIAGNOSIS — S32010D Wedge compression fracture of first lumbar vertebra, subsequent encounter for fracture with routine healing: Secondary | ICD-10-CM

## 2012-12-23 DIAGNOSIS — R262 Difficulty in walking, not elsewhere classified: Secondary | ICD-10-CM

## 2012-12-23 DIAGNOSIS — K5900 Constipation, unspecified: Secondary | ICD-10-CM

## 2012-12-23 DIAGNOSIS — K625 Hemorrhage of anus and rectum: Secondary | ICD-10-CM

## 2012-12-23 NOTE — Progress Notes (Signed)
Patient ID: Alexander Hanna, male   DOB: [birth year], 72 y.o.   MRN: [id number] Visit Summary     This is a discharge note.  Level of care skilled.  Facility [facility].  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 He has a history of significant gait impairment and has a history of lumbar spinal stenosis and a right hip arthroplasty performed within the last 2 years he has had progressive weakness of both lower extremities stumbling and falling more so over the last 4 months. Reviewing medical records suggests the problem has been there since at least 2012 for. He was seen and admitted to the hospital. No focal deficits were found. Repeat MRI of the lumbar sacral spine revealed compression fractures of L1 which was subacute and multiple herniated nucleus pulposus with mild to moderate foraminal stenosis right and left side. He was seen in consultation by neurology to see if this was a factor in his progressive gait disturbance. Neurology apparently felt the gait decline was multifactorial. He was recommended for physical therapy in a skilled facility. Beyond this I really have no additional information on the progression of this problem. He has had a recent MRI of his LS-spine, CT scan of his head and cervical spine [see results below]. His B12 level is normal  He has progressed well here with therapy-he will be going to an assisted living-previously he had lived with his brother and sister-in-law-they continue to be very supportive.  Main issue during his stay here was an episode of rectal bleeding that came after a difficult bowel movement-there been no further episodes he is now having regular bowel movements I suspect this was more trauma related to the actual bowel movement--.  His blood work including hemoglobin have remained stable.  He does have  pain issues but he has done well with the oxycodone which he takes on an as-needed basis  His vital signs continued be stable his weight is also relatively stable     CT HEAD WITHOUT CONTRAST CT CERVICAL SPINE WITHOUT CONTRAST   Technique:  Multidetector CT imaging of the head and cervical spine was performed following the standard protocol without intravenous contrast.  Multiplanar CT image reconstructions of the cervical spine were also generated.   Comparison:  Maxillofacial CT 09/26/2012.   CT HEAD   Findings: Study is limited by gross patient motion.  With these limitations in mind, there is mild cerebral and cerebellar atrophy. Patchy areas of decreased attenuation throughout the deep white matter of the cerebral hemispheres bilaterally, favored to reflect mild chronic microvascular ischemic disease. No acute displaced skull fractures are identified.  No acute intracranial abnormality. Specifically, no evidence of acute post-traumatic intracranial hemorrhage, no definite regions of acute/subacute cerebral ischemia, no focal mass, mass effect, hydrocephalus or abnormal intra or extra-axial fluid collections.  The visualized paranasal sinuses and mastoids are well pneumatized.   IMPRESSION: 1.  Despite the mild limitations of today's examination, there is no evidence to suggest significant acute traumatic injury to the head or brain. 2.  Mild cerebral atrophy with mild chronic microvascular ischemic changes in the cerebral white matter.   CT CERVICAL SPINE   Findings: Study is limited by considerable patient motion.  With these limitations in mind, there do not appear to be any acute displaced cervical spine fractures.  Mild exaggeration of normal cervical lordosis is favored  to be positional.  Alignment is otherwise anatomic.  Prevertebral soft tissues are normal.  Mild multilevel degenerative disc disease and facet arthropathy. Visualized portions of the upper  thorax are unremarkable.   IMPRESSION: 1.  Mildly limited examination demonstrating no definite evidence to suggest significant acute traumatic injury to the cervical spine.   Clinical Data: Low back pain.  Spinal stenosis.  Progressive falls and leg weakness.  Abnormal lumbar spine radiograph.   MRI LUMBAR SPINE WITHOUT CONTRAST   Technique:  Multiplanar and multiecho pulse sequences of the lumbar spine were obtained without intravenous contrast.   Comparison: Lumbar spine radiographs 11/22/2012.  MRI lumbar spine 04/13/2009.   Findings: Normal signal is present in the conus medullaris which terminates at L1, within normal limits.  The L1 compression fractures acute with a large fluid cleft in the anterior to thirds of the vertebral body.  Edema is present throughout the remainder vertebral body.  There is significant loss centrally within maximal cortical light of 13 mm compared to 24 mm at the L2 level.   There is slight retropulsion of bone at the superior endplate and minimal disc bulging of the T12-L1 disc.  There is no significant stenosis.   Cystic changes have progressed in the kidneys bilaterally.  No focal parenchymal masses evident.  Limited imaging of the abdomen is otherwise unremarkable.   L1-2:  Negative.   L2-3:  Mild disc bulging and facet hypertrophy is present.  There is no significant stenosis.   L3-4:  A mild broad-based disc herniation is present.  Moderate facet hypertrophy and ligamentum flavum thickening is noted.  Mild lateral recess and foraminal narrowing is evident bilaterally, right greater than left.   L4-5:  A leftward disc herniation is present.  Mild to moderate facet hypertrophy is evident bilaterally.  This results and moderate left and mild right lateral recess narrowing.  Mild foraminal narrowing is present bilaterally.  There is mild progression since the prior exam.   L5-S1:  A rightward disc herniation is present.  Moderate  facet hypertrophy is worse on the right.  Moderate right and mild left lateral recess and foraminal stenosis is stable.   IMPRESSION:   1.  The L1 compression fracture is acute / subacute with at least 50% loss of height and a large cystic cleft. 2.  Slight retropulsion of bone along the superior endplate of L1 without significant spinal stenosis. 3.  Mild lateral recess and foraminal narrowing bilaterally at L3-4 is worse on the right.  This is progressed slightly since the prior exam. 4.  Progression of moderate left lateral recess narrowing at L4-5. 5.  Mild foraminal and right lateral recess narrowing at L4-5 is slightly worse as well. 6.  Moderate right and mild left lateral recess and foraminal stenosis at L5-S1 is not significantly changed.          Past Medical History   Diagnosis  Date   .  Polio         as a child   .  Environmental allergies     .  Hypothyroidism     .  Overactive bladder ?BPH     .  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  Cholecystectomy       .  Joint replacement  rt anterior hip   .  Cataract extraction w/phaco    12/20/2011       Procedure: CATARACT EXTRACTION PHACO AND INTRAOCULAR LENS PLACEMENT (IOC);  Surgeon: Tonny Branch, MD;  Location: [facility];  Service: Ophthalmology;  Laterality: Left;  CDE: 14.80   .  Cataract extraction w/phaco    01/14/2012       Procedure: CATARACT EXTRACTION PHACO AND INTRAOCULAR LENS PLACEMENT (IOC);  Surgeon: Tonny Branch, MD;  Location: [facility];  Service: Ophthalmology;  Laterality: Right;  CDE 19.57      Medications; Reviewed per [facility]     History       Social History   .  Marital Status:  Single       Spouse Name:  N/A       Number of Children:  N/A   .  Years of Education:  N/A       Occupational History   .  Not on file.        Social History Main Topics   .  Smoking status:  Never Smoker    .  Smokeless tobacco:  Not on file   .  Alcohol Use:  No   .  Drug Use:  No   .  Sexual Activity:  Not Currently       Other Topics  Concern   .  Not on file       Social History Narrative   .  Apparently lives with brother between here and [place]. I am uncertain about functional status or exact ADL abilities       Family History   Problem  Relation  Age of Onset   .  Lung cancer  Mother     .  Heart attack  Father     .  Angina  Brother     .  Coronary artery disease  Brother        Review of systems; this is very difficult to do to severe bilateral hearing loss, however the patient is not complaining of pain today. Respiratory; he does not describe shortness of breath Cardiac no clear chest pain Abdomen no clear abdominal pain or diarrhea GU apparently some history frequency but does not complaining of any burning or dysuria Musculoskeletal; this point neck and back pain appears to be controlled       Physical examination; Temperature 97.3 pulse 86 respirations 19 blood pressure 109/53-106/70-98/59 most recently.  Weight is 188 this appears to be relatively stable  Gen.; the patient does not appear to be in any distress HEENT; no oral lesions were seen. . 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 Some degree of venous stasis. Neurologic; some degree of increased tone in both upper extremity.--Is able to stand with some assistance    Mental status; --he is oriented to self can follow verbal commands is pleasant somewhat hindered exam secondary to hearing  difficulties  Labs.  12/19/2012.  WBC 4.1 hemoglobin 14.0 platelets 185.  12/06/2012.  Sodium 131 potassium 4.2 BUN 16 creatinine 0.62.  TSH-5.207.  12/01/2012.  Liver function tests within normal limits except alkaline phosphatase 129 and albumin of 3.3       Impression/plan #1 progressive  gait ataxia;--apparently he has gained some strength during his stay here-he will be going to assisted living-his pain appears to be controlled on  Oxycodone    #2 polio as a child-- clinical status does not look like post polio syndrome #3 hypothyroidism on replacement. TSH was recently elevated we did increase his Synthroid --we'll defer to primary care provider for followup #4 hyperlipidemia--since his stay here was quite short we have not ordered  a lipid panel---- liver function tests appear to be fairly unremarkable we'll defer to primary care provider for follow up of his lipids--he is on numerous agents including Simcor and Zetia #5 question COPD--this has been stable #6 question urinary frequency secondary to BPH. -This has been relatively stable he is on Avodart as well as Flomax #7 chronic cognitive impairment; unless I have history to suggest to the contrary I suspect this is lifelong #8 subacute fracture of L1 which I am assuming is traumatic--at this point pain appears to be controlled Hyponatremia-this appears to be mild will update a metabolic panel before discharge  Rectal bleeding-again thought to be due to constipation issues there've been no further episodes his hemoglobin has been stable.  Constipation-this appears to have resolved with Colace  [id number]-of note greater than 30 minutes spent preparing this discharge summary                       A

## 2012-12-25 ENCOUNTER — Other Ambulatory Visit: Payer: Self-pay | Admitting: *Deleted

## 2012-12-25 MED ORDER — OXYCODONE HCL 5 MG PO TABS: 5.0000 mg | ORAL_TABLET | ORAL | Status: DC | PRN

## 2013-03-20 DIAGNOSIS — R279 Unspecified lack of coordination: Secondary | ICD-10-CM | POA: Diagnosis not present

## 2013-03-20 DIAGNOSIS — M4800 Spinal stenosis, site unspecified: Secondary | ICD-10-CM | POA: Diagnosis not present

## 2013-03-20 DIAGNOSIS — S32009A Unspecified fracture of unspecified lumbar vertebra, initial encounter for closed fracture: Secondary | ICD-10-CM | POA: Diagnosis not present

## 2013-03-20 DIAGNOSIS — R262 Difficulty in walking, not elsewhere classified: Secondary | ICD-10-CM | POA: Diagnosis not present

## 2013-03-20 DIAGNOSIS — S069X0A Unspecified intracranial injury without loss of consciousness, initial encounter: Secondary | ICD-10-CM | POA: Diagnosis not present

## 2013-03-21 DIAGNOSIS — S069X0A Unspecified intracranial injury without loss of consciousness, initial encounter: Secondary | ICD-10-CM | POA: Diagnosis not present

## 2013-03-21 DIAGNOSIS — S32009A Unspecified fracture of unspecified lumbar vertebra, initial encounter for closed fracture: Secondary | ICD-10-CM | POA: Diagnosis not present

## 2013-03-21 DIAGNOSIS — R262 Difficulty in walking, not elsewhere classified: Secondary | ICD-10-CM | POA: Diagnosis not present

## 2013-03-21 DIAGNOSIS — R279 Unspecified lack of coordination: Secondary | ICD-10-CM | POA: Diagnosis not present

## 2013-03-21 DIAGNOSIS — M4800 Spinal stenosis, site unspecified: Secondary | ICD-10-CM | POA: Diagnosis not present

## 2013-03-23 DIAGNOSIS — S069X0A Unspecified intracranial injury without loss of consciousness, initial encounter: Secondary | ICD-10-CM | POA: Diagnosis not present

## 2013-03-23 DIAGNOSIS — S32009A Unspecified fracture of unspecified lumbar vertebra, initial encounter for closed fracture: Secondary | ICD-10-CM | POA: Diagnosis not present

## 2013-03-23 DIAGNOSIS — R262 Difficulty in walking, not elsewhere classified: Secondary | ICD-10-CM | POA: Diagnosis not present

## 2013-03-23 DIAGNOSIS — M4800 Spinal stenosis, site unspecified: Secondary | ICD-10-CM | POA: Diagnosis not present

## 2013-03-23 DIAGNOSIS — R279 Unspecified lack of coordination: Secondary | ICD-10-CM | POA: Diagnosis not present

## 2013-03-24 DIAGNOSIS — M4800 Spinal stenosis, site unspecified: Secondary | ICD-10-CM | POA: Diagnosis not present

## 2013-03-24 DIAGNOSIS — R262 Difficulty in walking, not elsewhere classified: Secondary | ICD-10-CM | POA: Diagnosis not present

## 2013-03-24 DIAGNOSIS — S32009A Unspecified fracture of unspecified lumbar vertebra, initial encounter for closed fracture: Secondary | ICD-10-CM | POA: Diagnosis not present

## 2013-03-24 DIAGNOSIS — R279 Unspecified lack of coordination: Secondary | ICD-10-CM | POA: Diagnosis not present

## 2013-03-24 DIAGNOSIS — S069X0A Unspecified intracranial injury without loss of consciousness, initial encounter: Secondary | ICD-10-CM | POA: Diagnosis not present

## 2013-03-25 DIAGNOSIS — S32009A Unspecified fracture of unspecified lumbar vertebra, initial encounter for closed fracture: Secondary | ICD-10-CM | POA: Diagnosis not present

## 2013-03-25 DIAGNOSIS — M4800 Spinal stenosis, site unspecified: Secondary | ICD-10-CM | POA: Diagnosis not present

## 2013-03-25 DIAGNOSIS — S069X0A Unspecified intracranial injury without loss of consciousness, initial encounter: Secondary | ICD-10-CM | POA: Diagnosis not present

## 2013-03-25 DIAGNOSIS — R279 Unspecified lack of coordination: Secondary | ICD-10-CM | POA: Diagnosis not present

## 2013-03-25 DIAGNOSIS — R262 Difficulty in walking, not elsewhere classified: Secondary | ICD-10-CM | POA: Diagnosis not present

## 2013-03-26 DIAGNOSIS — S32009A Unspecified fracture of unspecified lumbar vertebra, initial encounter for closed fracture: Secondary | ICD-10-CM | POA: Diagnosis not present

## 2013-03-26 DIAGNOSIS — M4800 Spinal stenosis, site unspecified: Secondary | ICD-10-CM | POA: Diagnosis not present

## 2013-03-26 DIAGNOSIS — R262 Difficulty in walking, not elsewhere classified: Secondary | ICD-10-CM | POA: Diagnosis not present

## 2013-03-26 DIAGNOSIS — S069X0A Unspecified intracranial injury without loss of consciousness, initial encounter: Secondary | ICD-10-CM | POA: Diagnosis not present

## 2013-03-26 DIAGNOSIS — R279 Unspecified lack of coordination: Secondary | ICD-10-CM | POA: Diagnosis not present

## 2013-03-30 DIAGNOSIS — S32009A Unspecified fracture of unspecified lumbar vertebra, initial encounter for closed fracture: Secondary | ICD-10-CM | POA: Diagnosis not present

## 2013-03-30 DIAGNOSIS — M4800 Spinal stenosis, site unspecified: Secondary | ICD-10-CM | POA: Diagnosis not present

## 2013-03-30 DIAGNOSIS — S069X0A Unspecified intracranial injury without loss of consciousness, initial encounter: Secondary | ICD-10-CM | POA: Diagnosis not present

## 2013-03-30 DIAGNOSIS — R262 Difficulty in walking, not elsewhere classified: Secondary | ICD-10-CM | POA: Diagnosis not present

## 2013-03-30 DIAGNOSIS — R279 Unspecified lack of coordination: Secondary | ICD-10-CM | POA: Diagnosis not present

## 2013-03-31 DIAGNOSIS — R262 Difficulty in walking, not elsewhere classified: Secondary | ICD-10-CM | POA: Diagnosis not present

## 2013-03-31 DIAGNOSIS — R279 Unspecified lack of coordination: Secondary | ICD-10-CM | POA: Diagnosis not present

## 2013-03-31 DIAGNOSIS — S32009A Unspecified fracture of unspecified lumbar vertebra, initial encounter for closed fracture: Secondary | ICD-10-CM | POA: Diagnosis not present

## 2013-03-31 DIAGNOSIS — S069X0A Unspecified intracranial injury without loss of consciousness, initial encounter: Secondary | ICD-10-CM | POA: Diagnosis not present

## 2013-03-31 DIAGNOSIS — M4800 Spinal stenosis, site unspecified: Secondary | ICD-10-CM | POA: Diagnosis not present

## 2013-04-02 DIAGNOSIS — S069X0A Unspecified intracranial injury without loss of consciousness, initial encounter: Secondary | ICD-10-CM | POA: Diagnosis not present

## 2013-04-02 DIAGNOSIS — R262 Difficulty in walking, not elsewhere classified: Secondary | ICD-10-CM | POA: Diagnosis not present

## 2013-04-02 DIAGNOSIS — R279 Unspecified lack of coordination: Secondary | ICD-10-CM | POA: Diagnosis not present

## 2013-04-02 DIAGNOSIS — M4800 Spinal stenosis, site unspecified: Secondary | ICD-10-CM | POA: Diagnosis not present

## 2013-04-02 DIAGNOSIS — S32009A Unspecified fracture of unspecified lumbar vertebra, initial encounter for closed fracture: Secondary | ICD-10-CM | POA: Diagnosis not present

## 2013-04-03 DIAGNOSIS — S32009A Unspecified fracture of unspecified lumbar vertebra, initial encounter for closed fracture: Secondary | ICD-10-CM | POA: Diagnosis not present

## 2013-04-03 DIAGNOSIS — R279 Unspecified lack of coordination: Secondary | ICD-10-CM | POA: Diagnosis not present

## 2013-04-03 DIAGNOSIS — M4800 Spinal stenosis, site unspecified: Secondary | ICD-10-CM | POA: Diagnosis not present

## 2013-04-03 DIAGNOSIS — R262 Difficulty in walking, not elsewhere classified: Secondary | ICD-10-CM | POA: Diagnosis not present

## 2013-04-03 DIAGNOSIS — S069X0A Unspecified intracranial injury without loss of consciousness, initial encounter: Secondary | ICD-10-CM | POA: Diagnosis not present

## 2013-04-06 DIAGNOSIS — M4800 Spinal stenosis, site unspecified: Secondary | ICD-10-CM | POA: Diagnosis not present

## 2013-04-06 DIAGNOSIS — S069X0A Unspecified intracranial injury without loss of consciousness, initial encounter: Secondary | ICD-10-CM | POA: Diagnosis not present

## 2013-04-06 DIAGNOSIS — R279 Unspecified lack of coordination: Secondary | ICD-10-CM | POA: Diagnosis not present

## 2013-04-06 DIAGNOSIS — S32009A Unspecified fracture of unspecified lumbar vertebra, initial encounter for closed fracture: Secondary | ICD-10-CM | POA: Diagnosis not present

## 2013-04-06 DIAGNOSIS — R262 Difficulty in walking, not elsewhere classified: Secondary | ICD-10-CM | POA: Diagnosis not present

## 2013-04-07 DIAGNOSIS — R279 Unspecified lack of coordination: Secondary | ICD-10-CM | POA: Diagnosis not present

## 2013-04-07 DIAGNOSIS — S32009A Unspecified fracture of unspecified lumbar vertebra, initial encounter for closed fracture: Secondary | ICD-10-CM | POA: Diagnosis not present

## 2013-04-07 DIAGNOSIS — S069X0A Unspecified intracranial injury without loss of consciousness, initial encounter: Secondary | ICD-10-CM | POA: Diagnosis not present

## 2013-04-07 DIAGNOSIS — M4800 Spinal stenosis, site unspecified: Secondary | ICD-10-CM | POA: Diagnosis not present

## 2013-04-07 DIAGNOSIS — R262 Difficulty in walking, not elsewhere classified: Secondary | ICD-10-CM | POA: Diagnosis not present

## 2013-04-08 DIAGNOSIS — R262 Difficulty in walking, not elsewhere classified: Secondary | ICD-10-CM | POA: Diagnosis not present

## 2013-04-08 DIAGNOSIS — S32009A Unspecified fracture of unspecified lumbar vertebra, initial encounter for closed fracture: Secondary | ICD-10-CM | POA: Diagnosis not present

## 2013-04-08 DIAGNOSIS — B91 Sequelae of poliomyelitis: Secondary | ICD-10-CM | POA: Diagnosis not present

## 2013-04-08 DIAGNOSIS — N400 Enlarged prostate without lower urinary tract symptoms: Secondary | ICD-10-CM | POA: Diagnosis not present

## 2013-04-08 DIAGNOSIS — E785 Hyperlipidemia, unspecified: Secondary | ICD-10-CM | POA: Diagnosis not present

## 2013-04-08 DIAGNOSIS — J309 Allergic rhinitis, unspecified: Secondary | ICD-10-CM | POA: Diagnosis not present

## 2013-04-08 DIAGNOSIS — E039 Hypothyroidism, unspecified: Secondary | ICD-10-CM | POA: Diagnosis not present

## 2013-04-08 DIAGNOSIS — Z8781 Personal history of (healed) traumatic fracture: Secondary | ICD-10-CM | POA: Diagnosis not present

## 2013-04-08 DIAGNOSIS — S069X0A Unspecified intracranial injury without loss of consciousness, initial encounter: Secondary | ICD-10-CM | POA: Diagnosis not present

## 2013-04-08 DIAGNOSIS — M1990 Unspecified osteoarthritis, unspecified site: Secondary | ICD-10-CM | POA: Diagnosis not present

## 2013-04-08 DIAGNOSIS — M4800 Spinal stenosis, site unspecified: Secondary | ICD-10-CM | POA: Diagnosis not present

## 2013-04-08 DIAGNOSIS — R279 Unspecified lack of coordination: Secondary | ICD-10-CM | POA: Diagnosis not present

## 2013-04-08 DIAGNOSIS — K219 Gastro-esophageal reflux disease without esophagitis: Secondary | ICD-10-CM | POA: Diagnosis not present

## 2013-04-09 DIAGNOSIS — M4800 Spinal stenosis, site unspecified: Secondary | ICD-10-CM | POA: Diagnosis not present

## 2013-04-09 DIAGNOSIS — S32009A Unspecified fracture of unspecified lumbar vertebra, initial encounter for closed fracture: Secondary | ICD-10-CM | POA: Diagnosis not present

## 2013-04-09 DIAGNOSIS — R279 Unspecified lack of coordination: Secondary | ICD-10-CM | POA: Diagnosis not present

## 2013-04-09 DIAGNOSIS — S069X0A Unspecified intracranial injury without loss of consciousness, initial encounter: Secondary | ICD-10-CM | POA: Diagnosis not present

## 2013-04-09 DIAGNOSIS — R262 Difficulty in walking, not elsewhere classified: Secondary | ICD-10-CM | POA: Diagnosis not present

## 2013-04-13 DIAGNOSIS — S069X0A Unspecified intracranial injury without loss of consciousness, initial encounter: Secondary | ICD-10-CM | POA: Diagnosis not present

## 2013-04-13 DIAGNOSIS — R279 Unspecified lack of coordination: Secondary | ICD-10-CM | POA: Diagnosis not present

## 2013-04-13 DIAGNOSIS — R262 Difficulty in walking, not elsewhere classified: Secondary | ICD-10-CM | POA: Diagnosis not present

## 2013-04-13 DIAGNOSIS — M4800 Spinal stenosis, site unspecified: Secondary | ICD-10-CM | POA: Diagnosis not present

## 2013-04-13 DIAGNOSIS — S32009A Unspecified fracture of unspecified lumbar vertebra, initial encounter for closed fracture: Secondary | ICD-10-CM | POA: Diagnosis not present

## 2013-04-14 DIAGNOSIS — S069X0A Unspecified intracranial injury without loss of consciousness, initial encounter: Secondary | ICD-10-CM | POA: Diagnosis not present

## 2013-04-14 DIAGNOSIS — M4800 Spinal stenosis, site unspecified: Secondary | ICD-10-CM | POA: Diagnosis not present

## 2013-04-14 DIAGNOSIS — R279 Unspecified lack of coordination: Secondary | ICD-10-CM | POA: Diagnosis not present

## 2013-04-14 DIAGNOSIS — R262 Difficulty in walking, not elsewhere classified: Secondary | ICD-10-CM | POA: Diagnosis not present

## 2013-04-14 DIAGNOSIS — S32009A Unspecified fracture of unspecified lumbar vertebra, initial encounter for closed fracture: Secondary | ICD-10-CM | POA: Diagnosis not present

## 2013-04-15 DIAGNOSIS — R262 Difficulty in walking, not elsewhere classified: Secondary | ICD-10-CM | POA: Diagnosis not present

## 2013-04-15 DIAGNOSIS — S069X0A Unspecified intracranial injury without loss of consciousness, initial encounter: Secondary | ICD-10-CM | POA: Diagnosis not present

## 2013-04-15 DIAGNOSIS — M4800 Spinal stenosis, site unspecified: Secondary | ICD-10-CM | POA: Diagnosis not present

## 2013-04-15 DIAGNOSIS — R279 Unspecified lack of coordination: Secondary | ICD-10-CM | POA: Diagnosis not present

## 2013-04-15 DIAGNOSIS — S32009A Unspecified fracture of unspecified lumbar vertebra, initial encounter for closed fracture: Secondary | ICD-10-CM | POA: Diagnosis not present

## 2013-04-16 DIAGNOSIS — M4800 Spinal stenosis, site unspecified: Secondary | ICD-10-CM | POA: Diagnosis not present

## 2013-04-16 DIAGNOSIS — R262 Difficulty in walking, not elsewhere classified: Secondary | ICD-10-CM | POA: Diagnosis not present

## 2013-04-16 DIAGNOSIS — S069X0A Unspecified intracranial injury without loss of consciousness, initial encounter: Secondary | ICD-10-CM | POA: Diagnosis not present

## 2013-04-16 DIAGNOSIS — S32009A Unspecified fracture of unspecified lumbar vertebra, initial encounter for closed fracture: Secondary | ICD-10-CM | POA: Diagnosis not present

## 2013-04-16 DIAGNOSIS — R279 Unspecified lack of coordination: Secondary | ICD-10-CM | POA: Diagnosis not present

## 2013-04-17 DIAGNOSIS — R262 Difficulty in walking, not elsewhere classified: Secondary | ICD-10-CM | POA: Diagnosis not present

## 2013-04-17 DIAGNOSIS — S32009A Unspecified fracture of unspecified lumbar vertebra, initial encounter for closed fracture: Secondary | ICD-10-CM | POA: Diagnosis not present

## 2013-04-17 DIAGNOSIS — M4800 Spinal stenosis, site unspecified: Secondary | ICD-10-CM | POA: Diagnosis not present

## 2013-04-17 DIAGNOSIS — S069X0A Unspecified intracranial injury without loss of consciousness, initial encounter: Secondary | ICD-10-CM | POA: Diagnosis not present

## 2013-04-17 DIAGNOSIS — R279 Unspecified lack of coordination: Secondary | ICD-10-CM | POA: Diagnosis not present

## 2013-04-20 DIAGNOSIS — R279 Unspecified lack of coordination: Secondary | ICD-10-CM | POA: Diagnosis not present

## 2013-04-20 DIAGNOSIS — R262 Difficulty in walking, not elsewhere classified: Secondary | ICD-10-CM | POA: Diagnosis not present

## 2013-04-20 DIAGNOSIS — M4800 Spinal stenosis, site unspecified: Secondary | ICD-10-CM | POA: Diagnosis not present

## 2013-04-20 DIAGNOSIS — S32009A Unspecified fracture of unspecified lumbar vertebra, initial encounter for closed fracture: Secondary | ICD-10-CM | POA: Diagnosis not present

## 2013-04-20 DIAGNOSIS — S069X0A Unspecified intracranial injury without loss of consciousness, initial encounter: Secondary | ICD-10-CM | POA: Diagnosis not present

## 2013-04-22 DIAGNOSIS — R279 Unspecified lack of coordination: Secondary | ICD-10-CM | POA: Diagnosis not present

## 2013-04-22 DIAGNOSIS — S32009A Unspecified fracture of unspecified lumbar vertebra, initial encounter for closed fracture: Secondary | ICD-10-CM | POA: Diagnosis not present

## 2013-04-22 DIAGNOSIS — M4800 Spinal stenosis, site unspecified: Secondary | ICD-10-CM | POA: Diagnosis not present

## 2013-04-22 DIAGNOSIS — R262 Difficulty in walking, not elsewhere classified: Secondary | ICD-10-CM | POA: Diagnosis not present

## 2013-04-22 DIAGNOSIS — S069X0A Unspecified intracranial injury without loss of consciousness, initial encounter: Secondary | ICD-10-CM | POA: Diagnosis not present

## 2013-04-23 DIAGNOSIS — S32009A Unspecified fracture of unspecified lumbar vertebra, initial encounter for closed fracture: Secondary | ICD-10-CM | POA: Diagnosis not present

## 2013-04-23 DIAGNOSIS — M4800 Spinal stenosis, site unspecified: Secondary | ICD-10-CM | POA: Diagnosis not present

## 2013-04-23 DIAGNOSIS — R279 Unspecified lack of coordination: Secondary | ICD-10-CM | POA: Diagnosis not present

## 2013-04-23 DIAGNOSIS — R262 Difficulty in walking, not elsewhere classified: Secondary | ICD-10-CM | POA: Diagnosis not present

## 2013-04-23 DIAGNOSIS — S069X0A Unspecified intracranial injury without loss of consciousness, initial encounter: Secondary | ICD-10-CM | POA: Diagnosis not present

## 2013-04-24 DIAGNOSIS — S32009A Unspecified fracture of unspecified lumbar vertebra, initial encounter for closed fracture: Secondary | ICD-10-CM | POA: Diagnosis not present

## 2013-04-24 DIAGNOSIS — R279 Unspecified lack of coordination: Secondary | ICD-10-CM | POA: Diagnosis not present

## 2013-04-24 DIAGNOSIS — R262 Difficulty in walking, not elsewhere classified: Secondary | ICD-10-CM | POA: Diagnosis not present

## 2013-04-24 DIAGNOSIS — M4800 Spinal stenosis, site unspecified: Secondary | ICD-10-CM | POA: Diagnosis not present

## 2013-04-24 DIAGNOSIS — S069X0A Unspecified intracranial injury without loss of consciousness, initial encounter: Secondary | ICD-10-CM | POA: Diagnosis not present

## 2013-04-27 DIAGNOSIS — S32009A Unspecified fracture of unspecified lumbar vertebra, initial encounter for closed fracture: Secondary | ICD-10-CM | POA: Diagnosis not present

## 2013-04-27 DIAGNOSIS — E46 Unspecified protein-calorie malnutrition: Secondary | ICD-10-CM | POA: Diagnosis not present

## 2013-04-27 DIAGNOSIS — R262 Difficulty in walking, not elsewhere classified: Secondary | ICD-10-CM | POA: Diagnosis not present

## 2013-04-27 DIAGNOSIS — E039 Hypothyroidism, unspecified: Secondary | ICD-10-CM | POA: Diagnosis not present

## 2013-04-27 DIAGNOSIS — R279 Unspecified lack of coordination: Secondary | ICD-10-CM | POA: Diagnosis not present

## 2013-04-27 DIAGNOSIS — E785 Hyperlipidemia, unspecified: Secondary | ICD-10-CM | POA: Diagnosis not present

## 2013-04-27 DIAGNOSIS — S069X0A Unspecified intracranial injury without loss of consciousness, initial encounter: Secondary | ICD-10-CM | POA: Diagnosis not present

## 2013-04-27 DIAGNOSIS — M4800 Spinal stenosis, site unspecified: Secondary | ICD-10-CM | POA: Diagnosis not present

## 2013-04-28 DIAGNOSIS — S069X0A Unspecified intracranial injury without loss of consciousness, initial encounter: Secondary | ICD-10-CM | POA: Diagnosis not present

## 2013-04-28 DIAGNOSIS — S32009A Unspecified fracture of unspecified lumbar vertebra, initial encounter for closed fracture: Secondary | ICD-10-CM | POA: Diagnosis not present

## 2013-04-28 DIAGNOSIS — R279 Unspecified lack of coordination: Secondary | ICD-10-CM | POA: Diagnosis not present

## 2013-04-28 DIAGNOSIS — R262 Difficulty in walking, not elsewhere classified: Secondary | ICD-10-CM | POA: Diagnosis not present

## 2013-04-28 DIAGNOSIS — M4800 Spinal stenosis, site unspecified: Secondary | ICD-10-CM | POA: Diagnosis not present

## 2013-04-29 DIAGNOSIS — N400 Enlarged prostate without lower urinary tract symptoms: Secondary | ICD-10-CM | POA: Diagnosis not present

## 2013-04-29 DIAGNOSIS — R269 Unspecified abnormalities of gait and mobility: Secondary | ICD-10-CM | POA: Diagnosis not present

## 2013-04-29 DIAGNOSIS — E039 Hypothyroidism, unspecified: Secondary | ICD-10-CM | POA: Diagnosis not present

## 2013-04-29 DIAGNOSIS — R293 Abnormal posture: Secondary | ICD-10-CM | POA: Diagnosis not present

## 2013-04-29 DIAGNOSIS — Z883 Allergy status to other anti-infective agents status: Secondary | ICD-10-CM | POA: Diagnosis not present

## 2013-04-29 DIAGNOSIS — E785 Hyperlipidemia, unspecified: Secondary | ICD-10-CM | POA: Diagnosis not present

## 2013-04-29 DIAGNOSIS — K219 Gastro-esophageal reflux disease without esophagitis: Secondary | ICD-10-CM | POA: Diagnosis present

## 2013-04-29 DIAGNOSIS — K5900 Constipation, unspecified: Secondary | ICD-10-CM | POA: Diagnosis not present

## 2013-04-29 DIAGNOSIS — J449 Chronic obstructive pulmonary disease, unspecified: Secondary | ICD-10-CM | POA: Diagnosis not present

## 2013-04-29 DIAGNOSIS — S0993XA Unspecified injury of face, initial encounter: Secondary | ICD-10-CM | POA: Diagnosis not present

## 2013-04-29 DIAGNOSIS — S72009A Fracture of unspecified part of neck of unspecified femur, initial encounter for closed fracture: Secondary | ICD-10-CM | POA: Diagnosis not present

## 2013-04-29 DIAGNOSIS — R279 Unspecified lack of coordination: Secondary | ICD-10-CM | POA: Diagnosis not present

## 2013-04-29 DIAGNOSIS — Z9181 History of falling: Secondary | ICD-10-CM | POA: Diagnosis not present

## 2013-04-29 DIAGNOSIS — R296 Repeated falls: Secondary | ICD-10-CM | POA: Diagnosis not present

## 2013-04-29 DIAGNOSIS — M6281 Muscle weakness (generalized): Secondary | ICD-10-CM | POA: Diagnosis not present

## 2013-04-29 DIAGNOSIS — M25569 Pain in unspecified knee: Secondary | ICD-10-CM | POA: Diagnosis not present

## 2013-04-29 DIAGNOSIS — S79929A Unspecified injury of unspecified thigh, initial encounter: Secondary | ICD-10-CM | POA: Diagnosis not present

## 2013-04-29 DIAGNOSIS — J4489 Other specified chronic obstructive pulmonary disease: Secondary | ICD-10-CM | POA: Diagnosis not present

## 2013-04-29 DIAGNOSIS — S139XXA Sprain of joints and ligaments of unspecified parts of neck, initial encounter: Secondary | ICD-10-CM | POA: Diagnosis not present

## 2013-04-29 DIAGNOSIS — S0990XA Unspecified injury of head, initial encounter: Secondary | ICD-10-CM | POA: Diagnosis not present

## 2013-04-29 DIAGNOSIS — Z79899 Other long term (current) drug therapy: Secondary | ICD-10-CM | POA: Diagnosis not present

## 2013-04-29 DIAGNOSIS — S298XXA Other specified injuries of thorax, initial encounter: Secondary | ICD-10-CM | POA: Diagnosis not present

## 2013-04-29 DIAGNOSIS — Z471 Aftercare following joint replacement surgery: Secondary | ICD-10-CM | POA: Diagnosis not present

## 2013-04-29 DIAGNOSIS — Z4889 Encounter for other specified surgical aftercare: Secondary | ICD-10-CM | POA: Diagnosis not present

## 2013-04-29 DIAGNOSIS — S79919A Unspecified injury of unspecified hip, initial encounter: Secondary | ICD-10-CM | POA: Diagnosis not present

## 2013-04-29 DIAGNOSIS — Z5189 Encounter for other specified aftercare: Secondary | ICD-10-CM | POA: Diagnosis not present

## 2013-04-29 DIAGNOSIS — Z96649 Presence of unspecified artificial hip joint: Secondary | ICD-10-CM | POA: Diagnosis not present

## 2013-04-29 DIAGNOSIS — S8990XA Unspecified injury of unspecified lower leg, initial encounter: Secondary | ICD-10-CM | POA: Diagnosis not present

## 2013-05-04 DIAGNOSIS — Z5189 Encounter for other specified aftercare: Secondary | ICD-10-CM | POA: Diagnosis not present

## 2013-05-04 DIAGNOSIS — Z4889 Encounter for other specified surgical aftercare: Secondary | ICD-10-CM | POA: Diagnosis not present

## 2013-05-04 DIAGNOSIS — S72009A Fracture of unspecified part of neck of unspecified femur, initial encounter for closed fracture: Secondary | ICD-10-CM | POA: Diagnosis not present

## 2013-05-04 DIAGNOSIS — E039 Hypothyroidism, unspecified: Secondary | ICD-10-CM | POA: Diagnosis not present

## 2013-05-04 DIAGNOSIS — M6281 Muscle weakness (generalized): Secondary | ICD-10-CM | POA: Diagnosis not present

## 2013-05-04 DIAGNOSIS — J449 Chronic obstructive pulmonary disease, unspecified: Secondary | ICD-10-CM | POA: Diagnosis not present

## 2013-05-04 DIAGNOSIS — R279 Unspecified lack of coordination: Secondary | ICD-10-CM | POA: Diagnosis not present

## 2013-05-04 DIAGNOSIS — R269 Unspecified abnormalities of gait and mobility: Secondary | ICD-10-CM | POA: Diagnosis not present

## 2013-05-04 DIAGNOSIS — R293 Abnormal posture: Secondary | ICD-10-CM | POA: Diagnosis not present

## 2013-05-04 DIAGNOSIS — Z96649 Presence of unspecified artificial hip joint: Secondary | ICD-10-CM | POA: Diagnosis not present

## 2013-05-04 DIAGNOSIS — S79919A Unspecified injury of unspecified hip, initial encounter: Secondary | ICD-10-CM | POA: Diagnosis not present

## 2013-05-04 DIAGNOSIS — N400 Enlarged prostate without lower urinary tract symptoms: Secondary | ICD-10-CM | POA: Diagnosis not present

## 2013-05-04 DIAGNOSIS — E785 Hyperlipidemia, unspecified: Secondary | ICD-10-CM | POA: Diagnosis not present

## 2013-05-04 DIAGNOSIS — S139XXA Sprain of joints and ligaments of unspecified parts of neck, initial encounter: Secondary | ICD-10-CM | POA: Diagnosis not present

## 2013-05-04 DIAGNOSIS — Z9181 History of falling: Secondary | ICD-10-CM | POA: Diagnosis not present

## 2013-05-18 DIAGNOSIS — Z96649 Presence of unspecified artificial hip joint: Secondary | ICD-10-CM | POA: Diagnosis not present

## 2013-05-18 DIAGNOSIS — S72009A Fracture of unspecified part of neck of unspecified femur, initial encounter for closed fracture: Secondary | ICD-10-CM | POA: Diagnosis not present

## 2013-05-26 DIAGNOSIS — K219 Gastro-esophageal reflux disease without esophagitis: Secondary | ICD-10-CM | POA: Diagnosis present

## 2013-05-26 DIAGNOSIS — R918 Other nonspecific abnormal finding of lung field: Secondary | ICD-10-CM | POA: Diagnosis not present

## 2013-05-26 DIAGNOSIS — Z452 Encounter for adjustment and management of vascular access device: Secondary | ICD-10-CM | POA: Diagnosis not present

## 2013-05-26 DIAGNOSIS — E46 Unspecified protein-calorie malnutrition: Secondary | ICD-10-CM | POA: Diagnosis not present

## 2013-05-26 DIAGNOSIS — A4901 Methicillin susceptible Staphylococcus aureus infection, unspecified site: Secondary | ICD-10-CM | POA: Diagnosis not present

## 2013-05-26 DIAGNOSIS — R269 Unspecified abnormalities of gait and mobility: Secondary | ICD-10-CM | POA: Diagnosis not present

## 2013-05-26 DIAGNOSIS — N390 Urinary tract infection, site not specified: Secondary | ICD-10-CM | POA: Diagnosis not present

## 2013-05-26 DIAGNOSIS — M2550 Pain in unspecified joint: Secondary | ICD-10-CM | POA: Diagnosis not present

## 2013-05-26 DIAGNOSIS — Z79899 Other long term (current) drug therapy: Secondary | ICD-10-CM | POA: Diagnosis not present

## 2013-05-26 DIAGNOSIS — E871 Hypo-osmolality and hyponatremia: Secondary | ICD-10-CM | POA: Diagnosis not present

## 2013-05-26 DIAGNOSIS — E039 Hypothyroidism, unspecified: Secondary | ICD-10-CM | POA: Diagnosis present

## 2013-05-26 DIAGNOSIS — J449 Chronic obstructive pulmonary disease, unspecified: Secondary | ICD-10-CM | POA: Diagnosis not present

## 2013-05-26 DIAGNOSIS — Z7401 Bed confinement status: Secondary | ICD-10-CM | POA: Diagnosis not present

## 2013-05-26 DIAGNOSIS — R339 Retention of urine, unspecified: Secondary | ICD-10-CM | POA: Diagnosis present

## 2013-05-26 DIAGNOSIS — N401 Enlarged prostate with lower urinary tract symptoms: Secondary | ICD-10-CM | POA: Diagnosis present

## 2013-05-26 DIAGNOSIS — N319 Neuromuscular dysfunction of bladder, unspecified: Secondary | ICD-10-CM | POA: Diagnosis not present

## 2013-05-26 DIAGNOSIS — R279 Unspecified lack of coordination: Secondary | ICD-10-CM | POA: Diagnosis not present

## 2013-05-26 DIAGNOSIS — E236 Other disorders of pituitary gland: Secondary | ICD-10-CM | POA: Diagnosis not present

## 2013-05-26 DIAGNOSIS — J310 Chronic rhinitis: Secondary | ICD-10-CM | POA: Diagnosis present

## 2013-05-26 DIAGNOSIS — IMO0002 Reserved for concepts with insufficient information to code with codable children: Secondary | ICD-10-CM | POA: Diagnosis not present

## 2013-05-26 DIAGNOSIS — Z7982 Long term (current) use of aspirin: Secondary | ICD-10-CM | POA: Diagnosis not present

## 2013-05-26 DIAGNOSIS — D649 Anemia, unspecified: Secondary | ICD-10-CM | POA: Diagnosis not present

## 2013-05-26 DIAGNOSIS — M6281 Muscle weakness (generalized): Secondary | ICD-10-CM | POA: Diagnosis not present

## 2013-05-26 DIAGNOSIS — Z888 Allergy status to other drugs, medicaments and biological substances status: Secondary | ICD-10-CM | POA: Diagnosis not present

## 2013-05-26 DIAGNOSIS — J4489 Other specified chronic obstructive pulmonary disease: Secondary | ICD-10-CM | POA: Diagnosis not present

## 2013-05-26 DIAGNOSIS — D72829 Elevated white blood cell count, unspecified: Secondary | ICD-10-CM | POA: Diagnosis not present

## 2013-05-26 DIAGNOSIS — J9819 Other pulmonary collapse: Secondary | ICD-10-CM | POA: Diagnosis not present

## 2013-05-26 DIAGNOSIS — Z87891 Personal history of nicotine dependence: Secondary | ICD-10-CM | POA: Diagnosis not present

## 2013-05-26 DIAGNOSIS — N400 Enlarged prostate without lower urinary tract symptoms: Secondary | ICD-10-CM | POA: Diagnosis not present

## 2013-05-26 DIAGNOSIS — E785 Hyperlipidemia, unspecified: Secondary | ICD-10-CM | POA: Diagnosis present

## 2013-05-26 DIAGNOSIS — E876 Hypokalemia: Secondary | ICD-10-CM | POA: Diagnosis present

## 2013-05-26 DIAGNOSIS — N138 Other obstructive and reflux uropathy: Secondary | ICD-10-CM | POA: Diagnosis present

## 2013-05-26 DIAGNOSIS — Y838 Other surgical procedures as the cause of abnormal reaction of the patient, or of later complication, without mention of misadventure at the time of the procedure: Secondary | ICD-10-CM | POA: Diagnosis not present

## 2013-05-26 DIAGNOSIS — T8450XA Infection and inflammatory reaction due to unspecified internal joint prosthesis, initial encounter: Secondary | ICD-10-CM | POA: Diagnosis not present

## 2013-05-26 DIAGNOSIS — T819XXA Unspecified complication of procedure, initial encounter: Secondary | ICD-10-CM | POA: Diagnosis not present

## 2013-05-26 DIAGNOSIS — L02419 Cutaneous abscess of limb, unspecified: Secondary | ICD-10-CM | POA: Diagnosis present

## 2013-05-26 DIAGNOSIS — R262 Difficulty in walking, not elsewhere classified: Secondary | ICD-10-CM | POA: Diagnosis not present

## 2013-05-26 DIAGNOSIS — N179 Acute kidney failure, unspecified: Secondary | ICD-10-CM | POA: Diagnosis not present

## 2013-05-26 DIAGNOSIS — M259 Joint disorder, unspecified: Secondary | ICD-10-CM | POA: Diagnosis not present

## 2013-05-26 DIAGNOSIS — Z9181 History of falling: Secondary | ICD-10-CM | POA: Diagnosis not present

## 2013-05-26 DIAGNOSIS — Z96649 Presence of unspecified artificial hip joint: Secondary | ICD-10-CM | POA: Diagnosis not present

## 2013-05-26 DIAGNOSIS — T8140XA Infection following a procedure, unspecified, initial encounter: Secondary | ICD-10-CM | POA: Diagnosis not present

## 2013-05-26 DIAGNOSIS — L089 Local infection of the skin and subcutaneous tissue, unspecified: Secondary | ICD-10-CM | POA: Diagnosis not present

## 2013-05-26 DIAGNOSIS — L03119 Cellulitis of unspecified part of limb: Secondary | ICD-10-CM | POA: Diagnosis not present

## 2013-05-26 DIAGNOSIS — T8131XA Disruption of external operation (surgical) wound, not elsewhere classified, initial encounter: Secondary | ICD-10-CM | POA: Diagnosis not present

## 2013-05-26 DIAGNOSIS — E8809 Other disorders of plasma-protein metabolism, not elsewhere classified: Secondary | ICD-10-CM | POA: Diagnosis present

## 2013-06-01 ENCOUNTER — Ambulatory Visit (HOSPITAL_COMMUNITY)
Admission: RE | Admit: 2013-06-01 | Discharge: 2013-06-01 | Disposition: A | Discharge status: Home / Self Care | Payer: Medicare Other | Source: Ambulatory Visit | Attending: Orthopedic Surgery | Admitting: Orthopedic Surgery

## 2013-06-01 NOTE — Discharge Instructions (Signed)
PICC Home Guide A peripherally inserted central catheter (PICC) is a long, thin, flexible tube that is inserted into a vein in the upper arm. It is a form of intravenous (IV) access. It is considered to be a "central" line because the tip of the PICC ends in a large vein in your chest. This large vein is called the superior vena cava (SVC). The PICC tip ends in the SVC because there is a lot of blood flow in the SVC. This allows medicines and IV fluids to be quickly distributed throughout the body. The PICC is inserted using a sterile technique by a specially trained nurse or physician. After the PICC is inserted, a chest X-ray exam is done to be sure it is in the correct place.  A PICC may be placed for different reasons, such as:  To give medicines and liquid nutrition that can only be given through a central line. Examples are:  Certain antibiotic treatments.  Chemotherapy.  Total parenteral nutrition (TPN).  To take frequent blood samples.  To give IV fluids and blood products.  If there is difficulty placing a peripheral intravenous (PIV) catheter. If taken care of properly, a PICC can remain in place for several months. A PICC can also allow a person to go home from the hospital early. Medicine and PICC care can be managed at home by a family member or home health care team. WHAT PROBLEMS CAN HAPPEN WHEN I HAVE A PICC? Problems with a PICC can occasionally occur. These may include:  A blood clot (thrombus) forming in or at the tip of the PICC. This can cause the PICC to become clogged. A clot-dissolving medicine called tissue plasminogen activator (tPA) can be given through the PICC to help break up the clot.  Inflammation of the vein (phlebitis) in which the PICC is placed. Signs of inflammation may include redness, pain at the insertion site, red streaks, or being able to feel a "cord" in the vein where the PICC is located.  Infection in the PICC or at the insertion site. Signs of  infection may include fever, chills, redness, swelling, or pus drainage from the PICC insertion site.  PICC movement (malposition). The PICC tip may move from its original position due to excessive physical activity, forceful coughing, sneezing, or vomiting.  A break or cut in the PICC. It is important to not use scissors near the PICC.  Nerve or tendon irritation or injury during PICC insertion. WHAT SHOULD I KEEP IN MIND ABOUT ACTIVITIES WHEN I HAVE A PICC?  You may bend your arm and move it freely. If your PICC is near or at the bend of your elbow, avoid activity with repeated motion at the elbow.  Rest at home for the remainder of the day following PICC line insertion.  Avoid lifting heavy objects as instructed by your health care provider.  Avoid using a crutch with the arm on the same side as your PICC. You may need to use a walker. WHAT SHOULD I KNOW ABOUT MY PICC DRESSING?  Keep your PICC bandage (dressing) clean and dry to prevent infection.  Ask your health care provider when you may shower. Ask your health care provider to teach you how to wrap the PICC when you do take a shower.  Change the PICC dressing as instructed by your health care provider.  Change your PICC dressing if it becomes loose or wet. WHAT SHOULD I KNOW ABOUT PICC CARE?  Check the PICC insertion site daily for   leakage, redness, swelling, or pain.  Do not take a bath, swim, or use hot tubs when you have a PICC. Cover PICC line with clear plastic wrap and tape to keep it dry while showering.  Flush the PICC as directed by your health care provider. Let your health care provider know right away if the PICC is difficult to flush or does not flush. Do not use force to flush the PICC.  Do not use a syringe that is less than 10 mL to flush the PICC.  Never pull or tug on the PICC.  Avoid blood pressure checks on the arm with the PICC.  Keep your PICC identification card with you at all times.  Do not  take the PICC out yourself. Only a trained clinical professional should remove the PICC. SEEK IMMEDIATE MEDICAL CARE IF:  Your PICC is accidently pulled all the way out. If this happens, cover the insertion site with a bandage or gauze dressing. Do not throw the PICC away. Your health care provider will need to inspect it.  Your PICC was tugged or pulled and has partially come out. Do not  push the PICC back in.  There is any type of drainage, redness, or swelling where the PICC enters the skin.  You cannot flush the PICC, it is difficult to flush, or the PICC leaks around the insertion site when it is flushed.  You hear a "flushing" sound when the PICC is flushed.  You have pain, discomfort, or numbness in your arm, shoulder, or jaw on the same side as the PICC.  You feel your heart "racing" or skipping beats.  You notice a hole or tear in the PICC.  You develop chills or a fever. MAKE SURE YOU:   Understand these instructions.  Will watch your condition.  Will get help right away if you are not doing well or get worse. Document Released: 09/09/2002 Document Revised: 12/24/2012 Document Reviewed: 11/10/2012 [employer] Patient Information 2014 [employer].  

## 2013-06-01 NOTE — Progress Notes (Signed)
Patient coming for picc line placement for cellulitis in left hip. To be on antibiotic therapy.

## 2013-06-02 ENCOUNTER — Ambulatory Visit (HOSPITAL_COMMUNITY): Payer: Medicare Other

## 2013-06-03 ENCOUNTER — Ambulatory Visit (HOSPITAL_COMMUNITY): Payer: Medicare Other

## 2013-06-04 ENCOUNTER — Ambulatory Visit (HOSPITAL_COMMUNITY): Payer: Medicare Other

## 2013-06-05 ENCOUNTER — Ambulatory Visit (HOSPITAL_COMMUNITY)
Admission: RE | Admit: 2013-06-05 | Discharge: 2013-06-05 | Disposition: A | Discharge status: Home / Self Care | Payer: Medicare Other | Source: Ambulatory Visit | Attending: Internal Medicine | Admitting: Internal Medicine

## 2013-06-05 ENCOUNTER — Ambulatory Visit (HOSPITAL_COMMUNITY)
Admission: RE | Admit: 2013-06-05 | Discharge: 2013-06-05 | Disposition: A | Discharge status: Home / Self Care | Payer: Medicare Other | Source: Ambulatory Visit | Attending: Orthopedic Surgery | Admitting: Orthopedic Surgery

## 2013-06-05 DIAGNOSIS — R339 Retention of urine, unspecified: Secondary | ICD-10-CM | POA: Diagnosis not present

## 2013-06-05 DIAGNOSIS — R279 Unspecified lack of coordination: Secondary | ICD-10-CM | POA: Diagnosis not present

## 2013-06-05 DIAGNOSIS — M25569 Pain in unspecified knee: Secondary | ICD-10-CM | POA: Diagnosis not present

## 2013-06-05 DIAGNOSIS — IMO0002 Reserved for concepts with insufficient information to code with codable children: Secondary | ICD-10-CM | POA: Diagnosis not present

## 2013-06-05 DIAGNOSIS — E785 Hyperlipidemia, unspecified: Secondary | ICD-10-CM | POA: Diagnosis not present

## 2013-06-05 DIAGNOSIS — S72033A Displaced midcervical fracture of unspecified femur, initial encounter for closed fracture: Secondary | ICD-10-CM | POA: Diagnosis not present

## 2013-06-05 DIAGNOSIS — M25559 Pain in unspecified hip: Secondary | ICD-10-CM | POA: Diagnosis not present

## 2013-06-05 DIAGNOSIS — K219 Gastro-esophageal reflux disease without esophagitis: Secondary | ICD-10-CM | POA: Diagnosis not present

## 2013-06-05 DIAGNOSIS — J449 Chronic obstructive pulmonary disease, unspecified: Secondary | ICD-10-CM | POA: Diagnosis not present

## 2013-06-05 DIAGNOSIS — K5900 Constipation, unspecified: Secondary | ICD-10-CM | POA: Diagnosis not present

## 2013-06-05 DIAGNOSIS — T8389XA Other specified complication of genitourinary prosthetic devices, implants and grafts, initial encounter: Secondary | ICD-10-CM | POA: Diagnosis not present

## 2013-06-05 DIAGNOSIS — J9819 Other pulmonary collapse: Secondary | ICD-10-CM | POA: Diagnosis not present

## 2013-06-05 DIAGNOSIS — M79609 Pain in unspecified limb: Secondary | ICD-10-CM | POA: Diagnosis not present

## 2013-06-05 DIAGNOSIS — E236 Other disorders of pituitary gland: Secondary | ICD-10-CM | POA: Diagnosis not present

## 2013-06-05 DIAGNOSIS — E46 Unspecified protein-calorie malnutrition: Secondary | ICD-10-CM | POA: Diagnosis not present

## 2013-06-05 DIAGNOSIS — F79 Unspecified intellectual disabilities: Secondary | ICD-10-CM | POA: Diagnosis not present

## 2013-06-05 DIAGNOSIS — Z8612 Personal history of poliomyelitis: Secondary | ICD-10-CM | POA: Diagnosis not present

## 2013-06-05 DIAGNOSIS — Z79899 Other long term (current) drug therapy: Secondary | ICD-10-CM | POA: Diagnosis not present

## 2013-06-05 DIAGNOSIS — N318 Other neuromuscular dysfunction of bladder: Secondary | ICD-10-CM | POA: Diagnosis not present

## 2013-06-05 DIAGNOSIS — Z452 Encounter for adjustment and management of vascular access device: Secondary | ICD-10-CM | POA: Insufficient documentation

## 2013-06-05 DIAGNOSIS — E871 Hypo-osmolality and hyponatremia: Secondary | ICD-10-CM | POA: Diagnosis not present

## 2013-06-05 DIAGNOSIS — L89109 Pressure ulcer of unspecified part of back, unspecified stage: Secondary | ICD-10-CM | POA: Diagnosis not present

## 2013-06-05 DIAGNOSIS — Z7401 Bed confinement status: Secondary | ICD-10-CM | POA: Diagnosis not present

## 2013-06-05 DIAGNOSIS — N319 Neuromuscular dysfunction of bladder, unspecified: Secondary | ICD-10-CM | POA: Diagnosis not present

## 2013-06-05 DIAGNOSIS — T8140XA Infection following a procedure, unspecified, initial encounter: Secondary | ICD-10-CM | POA: Diagnosis not present

## 2013-06-05 DIAGNOSIS — Z792 Long term (current) use of antibiotics: Secondary | ICD-10-CM | POA: Diagnosis not present

## 2013-06-05 DIAGNOSIS — Z7901 Long term (current) use of anticoagulants: Secondary | ICD-10-CM | POA: Diagnosis not present

## 2013-06-05 DIAGNOSIS — E559 Vitamin D deficiency, unspecified: Secondary | ICD-10-CM | POA: Diagnosis not present

## 2013-06-05 DIAGNOSIS — E039 Hypothyroidism, unspecified: Secondary | ICD-10-CM | POA: Diagnosis not present

## 2013-06-05 DIAGNOSIS — L02419 Cutaneous abscess of limb, unspecified: Secondary | ICD-10-CM | POA: Diagnosis not present

## 2013-06-05 DIAGNOSIS — E876 Hypokalemia: Secondary | ICD-10-CM | POA: Diagnosis not present

## 2013-06-05 DIAGNOSIS — R319 Hematuria, unspecified: Secondary | ICD-10-CM | POA: Diagnosis not present

## 2013-06-05 DIAGNOSIS — T82898A Other specified complication of vascular prosthetic devices, implants and grafts, initial encounter: Secondary | ICD-10-CM | POA: Diagnosis not present

## 2013-06-05 DIAGNOSIS — A4901 Methicillin susceptible Staphylococcus aureus infection, unspecified site: Secondary | ICD-10-CM | POA: Diagnosis not present

## 2013-06-05 DIAGNOSIS — Z7982 Long term (current) use of aspirin: Secondary | ICD-10-CM | POA: Diagnosis not present

## 2013-06-05 DIAGNOSIS — M6281 Muscle weakness (generalized): Secondary | ICD-10-CM | POA: Diagnosis not present

## 2013-06-05 DIAGNOSIS — M2550 Pain in unspecified joint: Secondary | ICD-10-CM | POA: Diagnosis not present

## 2013-06-05 DIAGNOSIS — R000 Tachycardia, unspecified: Secondary | ICD-10-CM | POA: Diagnosis not present

## 2013-06-05 DIAGNOSIS — Z471 Aftercare following joint replacement surgery: Secondary | ICD-10-CM | POA: Diagnosis not present

## 2013-06-05 DIAGNOSIS — L8992 Pressure ulcer of unspecified site, stage 2: Secondary | ICD-10-CM | POA: Diagnosis not present

## 2013-06-05 DIAGNOSIS — R1310 Dysphagia, unspecified: Secondary | ICD-10-CM | POA: Diagnosis not present

## 2013-06-05 DIAGNOSIS — S72009A Fracture of unspecified part of neck of unspecified femur, initial encounter for closed fracture: Secondary | ICD-10-CM | POA: Diagnosis not present

## 2013-06-05 DIAGNOSIS — R262 Difficulty in walking, not elsewhere classified: Secondary | ICD-10-CM | POA: Diagnosis not present

## 2013-06-05 DIAGNOSIS — N390 Urinary tract infection, site not specified: Secondary | ICD-10-CM | POA: Diagnosis not present

## 2013-06-05 DIAGNOSIS — N400 Enlarged prostate without lower urinary tract symptoms: Secondary | ICD-10-CM | POA: Diagnosis not present

## 2013-06-05 DIAGNOSIS — Z880 Allergy status to penicillin: Secondary | ICD-10-CM | POA: Diagnosis not present

## 2013-06-05 DIAGNOSIS — Z96649 Presence of unspecified artificial hip joint: Secondary | ICD-10-CM | POA: Diagnosis not present

## 2013-06-05 DIAGNOSIS — N179 Acute kidney failure, unspecified: Secondary | ICD-10-CM | POA: Diagnosis not present

## 2013-06-05 MED ORDER — SODIUM CHLORIDE 0.9 % IJ SOLN: 10.0000 mL | Freq: Two times a day (BID) | INTRAMUSCULAR | Status: DC

## 2013-06-05 MED ORDER — SODIUM CHLORIDE 0.9 % IJ SOLN: 10.0000 mL | INTRAMUSCULAR | Status: DC | PRN

## 2013-06-05 NOTE — Progress Notes (Signed)
06/05/13 1406  Medical Necessity for Transport Certificate --- IF THIS TRANSPORT IS ROUND TRIP OR SCHEDULED AND REPEATED, A PHYSICIAN MUST COMPLETE THIS FORM  Transport to (Location) Other (Comment) ( [facility])  Reason for Transport Procedure  Name of [facility]  Round Trip Transport? No  Q1 Are ALL the following "true" for this patient?              1. Unable to get up from bed without assistance  AND            2. Unable to ambulate AND        3. Unable to sit in a chair, including a wheelchair. Yes  Q2 Could the patient be transported safely by other means of transportation (I.E., wheelchair van)? No  Reason for transport - patient condition Risk of injury to self and/or others  Q3 Reason based on Facility and/or [employer] not available at original facility  [employer] Other (Comment)  [employer] (can be: Physician, RN, NP, PA, SW, CNS) G. Lizette Pazos, Therapist, sports

## 2013-06-05 NOTE — Discharge Instructions (Signed)
Peripherally Inserted Central Catheter/Midline Placement  The IV Nurse has discussed with the patient and/or persons authorized to consent for the patient, the purpose of this procedure and the potential benefits and risks involved with this procedure.  The benefits include less needle sticks, lab draws from the catheter and patient may be discharged home with the catheter.  Risks include, but not limited to, infection, bleeding, blood clot (thrombus formation), and puncture of an artery; nerve damage and irregular heat beat.  Alternatives to this procedure were also discussed.  PICC/Midline Placement Documentation  PICC / Midline Single Lumen 0000000 PICC Right Basilic 36 cm 0 cm (Active)  Indication for Insertion or Continuance of Line Home intravenous therapies (PICC only) 06/05/2013 12:57 PM  Exposed Catheter (cm) 0 cm 06/05/2013 12:57 PM  Site Assessment Clean;Dry;Intact 06/05/2013 12:57 PM  Line Status Flushed;Blood return noted;Saline locked 06/05/2013 12:57 PM  Dressing Type Transparent;Securing device 06/05/2013 12:57 PM  Dressing Status Antimicrobial disc in place;Intact;Dry;Clean 06/05/2013 12:57 PM  Line Care Connections checked and tightened 06/05/2013 12:57 PM  Dressing Intervention New dressing 06/05/2013 12:57 PM  Dressing Change Due 06/12/13 06/05/2013 12:57 PM       Warda Mcqueary, Essie Hart 06/05/2013, 1:26 PM PICC Insertion, Care After Refer to this sheet in the next few weeks. These instructions provide you with information on caring for yourself after your procedure. Your health care provider may also give you more specific instructions. Your treatment has been planned according to current medical practices, but problems sometimes occur. Call your health care provider if you have any problems or questions after your procedure. WHAT TO EXPECT AFTER THE PROCEDURE After your procedure, it is typical to have the following:  Mild discomfort at the insertion site. This should not last  more than a day. HOME CARE INSTRUCTIONS  Rest at home for the remainder of the day after the procedure.  You may bend your arm and move it freely. If your PICC is near or at the bend of your elbow, avoid activity with repeated motion at the elbow.  Avoid lifting heavy objects as instructed by your health care provider.  Avoid using a crutch with the arm on the same side as your PICC. You may need to use a walker. Bandage Care  Keep your PICC bandage (dressing) clean and dry to prevent infection.  Ask your health care provider when you may shower. To keep the dressing dry, cover the PICC with plastic wrap and tape before showering. If the dressing does become wet, replace it right after the shower.  Do not soak in the bath, swim, or use hot tubs when you have a PICC.  Change the PICC dressing as instructed by your health care provider.  Change your PICC dressing if it becomes loose or wet. General PICC Care  Check the PICC insertion site daily for leakage, redness, swelling, or pain.  Flush the PICC as directed by your health care provider. Let your health care provider know right away if the PICC is difficult to flush or does not flush. Do not use force to flush the PICC.   Do not use a syringe that is less than 10 mL to flush the PICC.  Never pull or tug on the PICC.  Avoid blood pressure checks on the arm with the PICC.  Keep your PICC identification card with you at all times.  Do not take the PICC out yourself. Only a trained health care professional should remove the PICC.  SEEK MEDICAL CARE IF:  You have pain in your arm, ear, face, or teeth.  You have fever or chills.  You have drainage from the PICC insertion site.  You have redness or palpate a "cord" around the PICC insertion site.  You cannot flush the catheter. SEEK IMMEDIATE MEDICAL CARE IF:  You have swelling in the arm in which the PICC is inserted. Document Released: 12/24/2012 Document Reviewed:  11/10/2012 [facility] Patient Information 2014 [place], [place]. PICC Home Guide A peripherally inserted central catheter (PICC) is a long, thin, flexible tube that is inserted into a vein in the upper arm. It is a form of intravenous (IV) access. It is considered to be a "central" line because the tip of the PICC ends in a large vein in your chest. This large vein is called the superior vena cava (SVC). The PICC tip ends in the SVC because there is a lot of blood flow in the SVC. This allows medicines and IV fluids to be quickly distributed throughout the body. The PICC is inserted using a sterile technique by a specially trained nurse or physician. After the PICC is inserted, a chest X-ray exam is done to be sure it is in the correct place.  A PICC may be placed for different reasons, such as:  To give medicines and liquid nutrition that can only be given through a central line. Examples are:  Certain antibiotic treatments.  Chemotherapy.  Total parenteral nutrition (TPN).  To take frequent blood samples.  To give IV fluids and blood products.  If there is difficulty placing a peripheral intravenous (PIV) catheter. If taken care of properly, a PICC can remain in place for several months. A PICC can also allow a person to go home from the hospital early. Medicine and PICC care can be managed at home by a family member or home health care team. [place] A PICC? Problems with a PICC can occasionally occur. These may include:  A blood clot (thrombus) forming in or at the tip of the PICC. This can cause the PICC to become clogged. A clot-dissolving medicine called tissue plasminogen activator (tPA) can be given through the PICC to help break up the clot.  Inflammation of the vein (phlebitis) in which the PICC is placed. Signs of inflammation may include redness, pain at the insertion site, red streaks, or being able to feel a "cord" in the vein where the PICC is  located.  Infection in the PICC or at the insertion site. Signs of infection may include fever, chills, redness, swelling, or pus drainage from the PICC insertion site.  PICC movement (malposition). The PICC tip may move from its original position due to excessive physical activity, forceful coughing, sneezing, or vomiting.  A break or cut in the PICC. It is important to not use scissors near the PICC.  Nerve or tendon irritation or injury during PICC insertion. WHAT SHOULD I KEEP IN MIND ABOUT ACTIVITIES WHEN I HAVE A PICC?  You may bend your arm and move it freely. If your PICC is near or at the bend of your elbow, avoid activity with repeated motion at the elbow.  Rest at home for the remainder of the day following PICC line insertion.  Avoid lifting heavy objects as instructed by your health care provider.  Avoid using a crutch with the arm on the same side as your PICC. You may need to use a walker. WHAT SHOULD I KNOW ABOUT MY PICC DRESSING?  Keep your PICC  bandage (dressing) clean and dry to prevent infection.  Ask your health care provider when you may shower. Ask your health care provider to teach you how to wrap the PICC when you do take a shower.  Change the PICC dressing as instructed by your health care provider.  Change your PICC dressing if it becomes loose or wet. WHAT SHOULD I KNOW ABOUT PICC CARE?  Check the PICC insertion site daily for leakage, redness, swelling, or pain.  Do not take a bath, swim, or use hot tubs when you have a PICC. Cover PICC line with clear plastic wrap and tape to keep it dry while showering.  Flush the PICC as directed by your health care provider. Let your health care provider know right away if the PICC is difficult to flush or does not flush. Do not use force to flush the PICC.  Do not use a syringe that is less than 10 mL to flush the PICC.  Never pull or tug on the PICC.  Avoid blood pressure checks on the arm with the  PICC.  Keep your PICC identification card with you at all times.  Do not take the PICC out yourself. Only a trained clinical professional should remove the PICC. SEEK IMMEDIATE MEDICAL CARE IF:  Your PICC is accidently pulled all the way out. If this happens, cover the insertion site with a bandage or gauze dressing. Do not throw the PICC away. Your health care provider will need to inspect it.  Your PICC was tugged or pulled and has partially come out. Do not  push the PICC back in.  There is any type of drainage, redness, or swelling where the PICC enters the skin.  You cannot flush the PICC, it is difficult to flush, or the PICC leaks around the insertion site when it is flushed.  You hear a "flushing" sound when the PICC is flushed.  You have pain, discomfort, or numbness in your arm, shoulder, or jaw on the same side as the PICC.  You feel your heart "racing" or skipping beats.  You notice a hole or tear in the PICC.  You develop chills or a fever. MAKE SURE YOU:   Understand these instructions.  Will watch your condition.  Will get help right away if you are not doing well or get worse. Document Released: 09/09/2002 Document Revised: 12/24/2012 Document Reviewed: 11/10/2012 [facility] Patient Information 2014 [place]. PICC Home Guide A peripherally inserted central catheter (PICC) is a long, thin, flexible tube that is inserted into a vein in the upper arm. It is a form of intravenous (IV) access. It is considered to be a "central" line because the tip of the PICC ends in a large vein in your chest. This large vein is called the superior vena cava (SVC). The PICC tip ends in the SVC because there is a lot of blood flow in the SVC. This allows medicines and IV fluids to be quickly distributed throughout the body. The PICC is inserted using a sterile technique by a specially trained nurse or physician. After the PICC is inserted, a chest X-ray exam is done to be sure it is  in the correct place.  A PICC may be placed for different reasons, such as:  To give medicines and liquid nutrition that can only be given through a central line. Examples are:  Certain antibiotic treatments.  Chemotherapy.  Total parenteral nutrition (TPN).  To take frequent blood samples.  To give IV fluids and blood  products.  If there is difficulty placing a peripheral intravenous (PIV) catheter. If taken care of properly, a PICC can remain in place for several months. A PICC can also allow a person to go home from the hospital early. Medicine and PICC care can be managed at home by a family member or home health care team. [place] A PICC? Problems with a PICC can occasionally occur. These may include:  A blood clot (thrombus) forming in or at the tip of the PICC. This can cause the PICC to become clogged. A clot-dissolving medicine called tissue plasminogen activator (tPA) can be given through the PICC to help break up the clot.  Inflammation of the vein (phlebitis) in which the PICC is placed. Signs of inflammation may include redness, pain at the insertion site, red streaks, or being able to feel a "cord" in the vein where the PICC is located.  Infection in the PICC or at the insertion site. Signs of infection may include fever, chills, redness, swelling, or pus drainage from the PICC insertion site.  PICC movement (malposition). The PICC tip may move from its original position due to excessive physical activity, forceful coughing, sneezing, or vomiting.  A break or cut in the PICC. It is important to not use scissors near the PICC.  Nerve or tendon irritation or injury during PICC insertion. WHAT SHOULD I KEEP IN MIND ABOUT ACTIVITIES WHEN I HAVE A PICC?  You may bend your arm and move it freely. If your PICC is near or at the bend of your elbow, avoid activity with repeated motion at the elbow.  Rest at home for the remainder of the day  following PICC line insertion.  Avoid lifting heavy objects as instructed by your health care provider.  Avoid using a crutch with the arm on the same side as your PICC. You may need to use a walker. WHAT SHOULD I KNOW ABOUT MY PICC DRESSING?  Keep your PICC bandage (dressing) clean and dry to prevent infection.  Ask your health care provider when you may shower. Ask your health care provider to teach you how to wrap the PICC when you do take a shower.  Change the PICC dressing as instructed by your health care provider.  Change your PICC dressing if it becomes loose or wet. WHAT SHOULD I KNOW ABOUT PICC CARE?  Check the PICC insertion site daily for leakage, redness, swelling, or pain.  Do not take a bath, swim, or use hot tubs when you have a PICC. Cover PICC line with clear plastic wrap and tape to keep it dry while showering.  Flush the PICC as directed by your health care provider. Let your health care provider know right away if the PICC is difficult to flush or does not flush. Do not use force to flush the PICC.  Do not use a syringe that is less than 10 mL to flush the PICC.  Never pull or tug on the PICC.  Avoid blood pressure checks on the arm with the PICC.  Keep your PICC identification card with you at all times.  Do not take the PICC out yourself. Only a trained clinical professional should remove the PICC. SEEK IMMEDIATE MEDICAL CARE IF:  Your PICC is accidently pulled all the way out. If this happens, cover the insertion site with a bandage or gauze dressing. Do not throw the PICC away. Your health care provider will need to inspect it.  Your PICC was  tugged or pulled and has partially come out. Do not  push the PICC back in.  There is any type of drainage, redness, or swelling where the PICC enters the skin.  You cannot flush the PICC, it is difficult to flush, or the PICC leaks around the insertion site when it is flushed.  You hear a "flushing" sound when  the PICC is flushed.  You have pain, discomfort, or numbness in your arm, shoulder, or jaw on the same side as the PICC.  You feel your heart "racing" or skipping beats.  You notice a hole or tear in the PICC.  You develop chills or a fever. MAKE SURE YOU:   Understand these instructions.  Will watch your condition.  Will get help right away if you are not doing well or get worse. Document Released: 09/09/2002 Document Revised: 12/24/2012 Document Reviewed: 11/10/2012 [facility] Patient Information 2014 [place].

## 2013-06-10 DIAGNOSIS — L89109 Pressure ulcer of unspecified part of back, unspecified stage: Secondary | ICD-10-CM | POA: Diagnosis not present

## 2013-06-10 DIAGNOSIS — L8992 Pressure ulcer of unspecified site, stage 2: Secondary | ICD-10-CM | POA: Diagnosis not present

## 2013-06-12 DIAGNOSIS — M6281 Muscle weakness (generalized): Secondary | ICD-10-CM | POA: Diagnosis not present

## 2013-06-12 DIAGNOSIS — E559 Vitamin D deficiency, unspecified: Secondary | ICD-10-CM | POA: Diagnosis not present

## 2013-06-12 DIAGNOSIS — E876 Hypokalemia: Secondary | ICD-10-CM | POA: Diagnosis not present

## 2013-06-12 DIAGNOSIS — L89109 Pressure ulcer of unspecified part of back, unspecified stage: Secondary | ICD-10-CM | POA: Diagnosis not present

## 2013-06-16 DIAGNOSIS — S72033A Displaced midcervical fracture of unspecified femur, initial encounter for closed fracture: Secondary | ICD-10-CM | POA: Diagnosis not present

## 2013-06-17 ENCOUNTER — Emergency Department (HOSPITAL_COMMUNITY)
Admission: EM | Admit: 2013-06-17 | Discharge: 2013-06-17 | Disposition: A | Discharge status: Home / Self Care | Payer: Medicare Other | Attending: Emergency Medicine | Admitting: Emergency Medicine

## 2013-06-17 ENCOUNTER — Emergency Department (HOSPITAL_COMMUNITY): Payer: Medicare Other

## 2013-06-17 ENCOUNTER — Encounter (HOSPITAL_COMMUNITY): Payer: Self-pay | Admitting: Emergency Medicine

## 2013-06-17 DIAGNOSIS — E039 Hypothyroidism, unspecified: Secondary | ICD-10-CM | POA: Insufficient documentation

## 2013-06-17 DIAGNOSIS — T8389XA Other specified complication of genitourinary prosthetic devices, implants and grafts, initial encounter: Secondary | ICD-10-CM | POA: Diagnosis not present

## 2013-06-17 DIAGNOSIS — R262 Difficulty in walking, not elsewhere classified: Secondary | ICD-10-CM | POA: Diagnosis not present

## 2013-06-17 DIAGNOSIS — Y834 Other reconstructive surgery as the cause of abnormal reaction of the patient, or of later complication, without mention of misadventure at the time of the procedure: Secondary | ICD-10-CM | POA: Insufficient documentation

## 2013-06-17 DIAGNOSIS — N400 Enlarged prostate without lower urinary tract symptoms: Secondary | ICD-10-CM | POA: Diagnosis not present

## 2013-06-17 DIAGNOSIS — L8992 Pressure ulcer of unspecified site, stage 2: Secondary | ICD-10-CM | POA: Diagnosis not present

## 2013-06-17 DIAGNOSIS — M6281 Muscle weakness (generalized): Secondary | ICD-10-CM | POA: Diagnosis not present

## 2013-06-17 DIAGNOSIS — Z7401 Bed confinement status: Secondary | ICD-10-CM | POA: Diagnosis not present

## 2013-06-17 DIAGNOSIS — F79 Unspecified intellectual disabilities: Secondary | ICD-10-CM | POA: Insufficient documentation

## 2013-06-17 DIAGNOSIS — R339 Retention of urine, unspecified: Secondary | ICD-10-CM | POA: Diagnosis not present

## 2013-06-17 DIAGNOSIS — L02419 Cutaneous abscess of limb, unspecified: Secondary | ICD-10-CM | POA: Diagnosis not present

## 2013-06-17 DIAGNOSIS — K5900 Constipation, unspecified: Secondary | ICD-10-CM | POA: Diagnosis not present

## 2013-06-17 DIAGNOSIS — S72009A Fracture of unspecified part of neck of unspecified femur, initial encounter for closed fracture: Secondary | ICD-10-CM | POA: Diagnosis not present

## 2013-06-17 DIAGNOSIS — N318 Other neuromuscular dysfunction of bladder: Secondary | ICD-10-CM | POA: Insufficient documentation

## 2013-06-17 DIAGNOSIS — M79609 Pain in unspecified limb: Secondary | ICD-10-CM | POA: Diagnosis not present

## 2013-06-17 DIAGNOSIS — Z7901 Long term (current) use of anticoagulants: Secondary | ICD-10-CM | POA: Insufficient documentation

## 2013-06-17 DIAGNOSIS — R000 Tachycardia, unspecified: Secondary | ICD-10-CM | POA: Insufficient documentation

## 2013-06-17 DIAGNOSIS — Z792 Long term (current) use of antibiotics: Secondary | ICD-10-CM | POA: Insufficient documentation

## 2013-06-17 DIAGNOSIS — M25559 Pain in unspecified hip: Secondary | ICD-10-CM | POA: Diagnosis not present

## 2013-06-17 DIAGNOSIS — E871 Hypo-osmolality and hyponatremia: Secondary | ICD-10-CM | POA: Diagnosis not present

## 2013-06-17 DIAGNOSIS — R319 Hematuria, unspecified: Secondary | ICD-10-CM | POA: Diagnosis not present

## 2013-06-17 DIAGNOSIS — T8140XA Infection following a procedure, unspecified, initial encounter: Secondary | ICD-10-CM | POA: Diagnosis not present

## 2013-06-17 DIAGNOSIS — Z8612 Personal history of poliomyelitis: Secondary | ICD-10-CM | POA: Insufficient documentation

## 2013-06-17 DIAGNOSIS — N179 Acute kidney failure, unspecified: Secondary | ICD-10-CM | POA: Diagnosis not present

## 2013-06-17 DIAGNOSIS — L89109 Pressure ulcer of unspecified part of back, unspecified stage: Secondary | ICD-10-CM | POA: Diagnosis not present

## 2013-06-17 DIAGNOSIS — Z79899 Other long term (current) drug therapy: Secondary | ICD-10-CM | POA: Insufficient documentation

## 2013-06-17 DIAGNOSIS — Z7982 Long term (current) use of aspirin: Secondary | ICD-10-CM | POA: Insufficient documentation

## 2013-06-17 DIAGNOSIS — IMO0002 Reserved for concepts with insufficient information to code with codable children: Secondary | ICD-10-CM | POA: Insufficient documentation

## 2013-06-17 DIAGNOSIS — E876 Hypokalemia: Secondary | ICD-10-CM | POA: Diagnosis not present

## 2013-06-17 DIAGNOSIS — Z880 Allergy status to penicillin: Secondary | ICD-10-CM | POA: Insufficient documentation

## 2013-06-17 DIAGNOSIS — M2550 Pain in unspecified joint: Secondary | ICD-10-CM | POA: Diagnosis not present

## 2013-06-17 DIAGNOSIS — T8149XA Infection following a procedure, other surgical site, initial encounter: Secondary | ICD-10-CM

## 2013-06-17 DIAGNOSIS — L03119 Cellulitis of unspecified part of limb: Secondary | ICD-10-CM

## 2013-06-17 DIAGNOSIS — J9819 Other pulmonary collapse: Secondary | ICD-10-CM | POA: Diagnosis not present

## 2013-06-17 DIAGNOSIS — A4901 Methicillin susceptible Staphylococcus aureus infection, unspecified site: Secondary | ICD-10-CM | POA: Diagnosis not present

## 2013-06-17 LAB — COMPREHENSIVE METABOLIC PANEL
ALK PHOS: 119 U/L — AB (ref 39–117)
ALT: 19 U/L (ref 0–53)
AST: 37 U/L (ref 0–37)
Albumin: 2.7 g/dL — ABNORMAL LOW (ref 3.5–5.2)
BILIRUBIN TOTAL: 0.4 mg/dL (ref 0.3–1.2)
BUN: 8 mg/dL (ref 6–23)
CO2: 25 mEq/L (ref 19–32)
Calcium: 9.1 mg/dL (ref 8.4–10.5)
Chloride: 95 mEq/L — ABNORMAL LOW (ref 96–112)
Creatinine, Ser: 0.4 mg/dL — ABNORMAL LOW (ref 0.50–1.35)
GLUCOSE: 115 mg/dL — AB (ref 70–99)
POTASSIUM: 4.7 meq/L (ref 3.7–5.3)
Sodium: 132 mEq/L — ABNORMAL LOW (ref 137–147)
Total Protein: 7.2 g/dL (ref 6.0–8.3)

## 2013-06-17 LAB — CBC WITH DIFFERENTIAL/PLATELET
Basophils Absolute: 0 10*3/uL (ref 0.0–0.1)
Basophils Relative: 0 % (ref 0–1)
EOS ABS: 0.1 10*3/uL (ref 0.0–0.7)
Eosinophils Relative: 1 % (ref 0–5)
HCT: 37.8 % — ABNORMAL LOW (ref 39.0–52.0)
HEMOGLOBIN: 12.2 g/dL — AB (ref 13.0–17.0)
LYMPHS ABS: 1.3 10*3/uL (ref 0.7–4.0)
Lymphocytes Relative: 13 % (ref 12–46)
MCH: 29.4 pg (ref 26.0–34.0)
MCHC: 32.3 g/dL (ref 30.0–36.0)
MCV: 91.1 fL (ref 78.0–100.0)
MONOS PCT: 7 % (ref 3–12)
Monocytes Absolute: 0.7 10*3/uL (ref 0.1–1.0)
Neutro Abs: 7.8 10*3/uL — ABNORMAL HIGH (ref 1.7–7.7)
Neutrophils Relative %: 79 % — ABNORMAL HIGH (ref 43–77)
Platelets: 270 10*3/uL (ref 150–400)
RBC: 4.15 MIL/uL — ABNORMAL LOW (ref 4.22–5.81)
RDW: 15.8 % — ABNORMAL HIGH (ref 11.5–15.5)
WBC: 9.9 10*3/uL (ref 4.0–10.5)

## 2013-06-17 LAB — URINALYSIS, ROUTINE W REFLEX MICROSCOPIC
Bilirubin Urine: NEGATIVE
Glucose, UA: NEGATIVE mg/dL
KETONES UR: NEGATIVE mg/dL
Leukocytes, UA: NEGATIVE
Nitrite: NEGATIVE
PROTEIN: NEGATIVE mg/dL
Specific Gravity, Urine: 1.01 (ref 1.005–1.030)
Urobilinogen, UA: 0.2 mg/dL (ref 0.0–1.0)
pH: 7 (ref 5.0–8.0)

## 2013-06-17 LAB — URINE MICROSCOPIC-ADD ON

## 2013-06-17 MED ORDER — SODIUM CHLORIDE 0.9 % IV SOLN: 1000.0000 mL | INTRAVENOUS | Status: DC

## 2013-06-17 MED ORDER — SODIUM CHLORIDE 0.9 % IV SOLN
1000.0000 mL | Freq: Once | INTRAVENOUS | Status: AC
  Administered 2013-06-17: 1000 mL via INTRAVENOUS

## 2013-06-17 MED ORDER — LIDOCAINE-EPINEPHRINE (PF) 2 %-1:200000 IJ SOLN
20.0000 mL | Freq: Once | INTRAMUSCULAR | Status: DC
  Filled 2013-06-17: qty 20

## 2013-06-17 MED ORDER — SODIUM CHLORIDE 0.9 % IV BOLUS (SEPSIS)
500.0000 mL | Freq: Once | INTRAVENOUS | Status: AC
  Administered 2013-06-17: 500 mL via INTRAVENOUS

## 2013-06-17 NOTE — Discharge Instructions (Signed)
Continue IV doxycycline as previously directed. Daily dressing changes to left hip postoperative wound. Return sooner to the emergency department if you develop fever, change in mental status, new redness to left hip, uncontrolled pain or other concerns.

## 2013-06-17 NOTE — ED Notes (Signed)
POC lactic acid resulted at 2.05 at 17:00. EPD made aware.

## 2013-06-17 NOTE — ED Notes (Signed)
Dr. Stevie Kern at bedside to evaluate pt

## 2013-06-17 NOTE — ED Notes (Signed)
Pt fell and broke hip 04/29/13. Had left hip replacement, wound became infected. Currently receiving doxycycline via PICC. Has had I&D post surgically due to wound infection. Per caregiver, pus and blood noted from wound, and edema and erythema noted in the area

## 2013-06-17 NOTE — ED Provider Notes (Signed)
CSN: [id number]     Arrival date & time 06/17/13  1551 History   First MD Initiated Contact with Patient 06/17/13 [phone]     Chief Complaint  Patient presents with  . 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 also has chronic gait impairment, prior polio, and chronic stable LBP who had been at a rehabilitation facility last fall then at an assisted-living facility using a walker when he fell in February of this year and had a left hip fracture femoral neck underwent left hip hemiarthroplasty 11 February complicated by postoperative wound infection underwent incision and drainage debridement with his hip fracture repair and incision and drainage performed in [place] at [facility], he was discharged to a skilled nursing home with a PICC line on doxycycline for staph infection sensitive to doxycycline, both of the orthopedic surgeons are now out of town at the nursing home it was noted today that the patient had purulent drainage from between his staples on his left hip skin incision, there is been no fever no change in the patient's baseline mental status no vomiting no diarrhea no abdominal pain no chest pain no cough no shortness of breath and he is eating orally at baseline and there is no worsening left hip pain which has been controlled with oxycodone or hydrocodone.  Past Medical History  Diagnosis Date  . Polio     as a child  . Environmental allergies   . Hypothyroidism   . Overactive bladder   . 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 Cholecystectomy    . Cataract extraction w/phaco  12/20/2011    Procedure: CATARACT EXTRACTION PHACO AND INTRAOCULAR LENS PLACEMENT (IOC);  Surgeon: Tonny Branch, MD;  Location: [facility];  Service: Ophthalmology;  Laterality:  Left;  CDE: 14.80  . Cataract extraction w/phaco  01/14/2012    Procedure: CATARACT EXTRACTION PHACO AND INTRAOCULAR LENS PLACEMENT (IOC);  Surgeon: Tonny Branch, MD;  Location: [facility];  Service: Ophthalmology;  Laterality: Right;  CDE 19.57  . Joint replacement      rt anterior hip  . Joint replacement      Left hip  . Wound debridement Left     Post hip replacement   Family History  Problem Relation Age of Onset  . Lung cancer Mother   . Heart attack Father   . Angina Brother   . Coronary artery disease Brother    History  Substance Use Topics  . Smoking status: Never Smoker   . Smokeless tobacco: Never Used  . Alcohol Use: No    Review of Systems 10 Systems reviewed and are negative for acute change except as noted in the HPI.   Allergies  Amoxicillin; Hydrocodone; and Moxifloxacin  Home Medications   Current Outpatient Rx  Name  Route  Sig  Dispense  Refill  . aspirin EC 81 MG tablet   Oral   Take 81 mg by mouth daily.         . Calcium Carb-Cholecalciferol (CALCIUM 500 +D) 500-400 MG-UNIT TABS   Oral   Take 1 tablet by mouth 3 (three) times daily.         . cholecalciferol (VITAMIN D) 1000 UNITS tablet   Oral   Take 1,000 Units by mouth daily.         Marland Kitchen  docusate sodium (COLACE) 100 MG capsule   Oral   Take 100 mg by mouth 2 (two) times daily.         Marland Kitchen doxycycline (DOXY 100) 100 MG injection   Intravenous   Inject 100 mg into the vein 2 (two) times daily. Reconstituted Course started on 06/06/2013 for Cellulitis and Abscess of leg except foot         . dutasteride (AVODART) 0.5 MG capsule   Oral   Take 0.5 mg by mouth daily.         Marland Kitchen enoxaparin (LOVENOX) 40 MG/0.4ML injection   Subcutaneous   Inject 40 mg into the skin daily.         Marland Kitchen ezetimibe (ZETIA) 10 MG tablet   Oral   Take 10 mg by mouth daily.         . ferrous sulfate 325 (65 FE) MG tablet   Oral   Take 325 mg by mouth 3 (three) times daily with meals.         .  fluticasone (FLONASE) 50 MCG/ACT nasal spray   Nasal   Place 2 sprays into the nose daily. Allergies         . Heparin Lock Flush (HEPARIN FLUSH, PORCINE,) 100 UNIT/ML injection   Intracatheter   300 Units by Intracatheter route 3 (three) times daily. To use SASH after medication administration after each shift         . levothyroxine (SYNTHROID, LEVOTHROID) 137 MCG tablet   Oral   Take 137 mcg by mouth daily before breakfast.         . megestrol (MEGACE) 40 MG/ML suspension   Oral   Take 200 mg by mouth 2 (two) times daily.         . niacin-simvastatin (SIMCOR) 500-20 MG 24 hr tablet   Oral   Take 1 tablet by mouth at bedtime.         . Nutritional Supplements (RESOURCE 2.0) LIQD   Oral   Take 120 mLs by mouth 2 (two) times daily.         Marland Kitchen omeprazole (PRILOSEC) 20 MG capsule   Oral   Take 20 mg by mouth daily. 24 day supply prescribed on 05/22/2013         . potassium chloride SA (K-DUR,KLOR-CON) 20 MEQ tablet   Oral   Take 20-40 mEq by mouth 3 (three) times daily. Give two tablets at 8am and 8pm and give one tablet at 12pm daily         . Probiotic Product (PROBIOTIC DAILY PO)   Oral   Take 1 capsule by mouth daily.         . sodium chloride 1 G tablet   Oral   Take 2 g by mouth 3 (three) times daily.         . tamsulosin (FLOMAX) 0.4 MG CAPS capsule   Oral   Take 0.4 mg by mouth daily.          Marland Kitchen loratadine (CLARITIN) 10 MG tablet   Oral   Take 10 mg by mouth daily as needed. Allergies         . naproxen (NAPROSYN) 500 MG tablet   Oral   Take 500 mg by mouth 2 (two) times daily as needed (pain).          Marland Kitchen oxyCODONE (OXY IR/ROXICODONE) 5 MG immediate release tablet   Oral   Take 1 tablet (5 mg total) by mouth every 4 (  four) hours as needed.   30 tablet   0   . pseudoephedrine-guaifenesin (MUCINEX D) 60-600 MG per tablet   Oral   Take 1 tablet by mouth 2 (two) times daily as needed for congestion.         . traZODone (DESYREL)  50 MG tablet   Oral   Take 1 tablet (50 mg total) by mouth at bedtime as needed for sleep (insomnia).   30 tablet   1    BP 132/67  Pulse 93  Temp(Src) 97.9 F (36.6 C) (Oral)  Resp 25  Ht 5\' 10"  (1.778 m)  Wt 169 lb (76.658 kg)  BMI 24.25 kg/m2  SpO2 95% Physical Exam  Nursing note and vitals reviewed. Constitutional:  Awake, alert, nontoxic appearance.  HENT:  Head: Atraumatic.  Eyes: Right eye exhibits no discharge. Left eye exhibits no discharge.  Neck: Neck supple.  Cardiovascular: Regular rhythm.   No murmur heard. Mildly tachycardic ventricular rate 110  Pulmonary/Chest: Effort normal and breath sounds normal. No respiratory distress. He has no wheezes. He has no rales. He exhibits no tenderness.   Pulse oximetry normal on room air 95%  Abdominal: Soft. Bowel sounds are normal. He exhibits no distension and no mass. There is no tenderness. There is no rebound and no guarding.  Musculoskeletal: He exhibits no edema and no tenderness.  Baseline limited ROM, no obvious new focal weakness. Both arms and right leg nontender. Left leg has normal light touch with dorsalis pedis pulse intact capillary refill less than 2 seconds baseline movement to left foot. Left lateral hip incision has intact staples with erythema as expected along the staple line without obvious cellulitis around the wound however the wound itself is soft and fluctuant with limited bedside ultrasound revealing subcutaneous fluid collection along the incision; this area is minimally tender if at all  Neurological: He is alert.  Mental status and motor strength generalized weakness appears baseline for patient and situation. Patient is awake alert and able to answer simple yes no questions and follows some simple commands.  Skin: No rash noted.  Psychiatric: He has a normal mood and affect.    ED Course  Procedures (including critical care time) Patient / Family / Caregiver understand and agree with initial  ED impression and plan with expectations set for ED visit.Pt's family requests Dr. Aurea Graff coverage be called since he performed a right hip replacement on the patient in the past (paged). 69  D/w Dr. Doran Durand covering [place] recs remove staples to allow pus drainage.  INCISION AND DRAINAGE Performed by: Babette Relic Consent: Verbal consent obtained. Risks and benefits: risks, benefits and alternatives were discussed Time out performed prior to procedure Type: abscess Body area: left hip post-op  incision Anesthesia: local infiltration Incision ~4cm length along staple line was made with a scalpel. Local anesthetic: lidocaine 2% with epinephrine Anesthetic total: 5 ml Complexity: simple Drainage: purulent Drainage amount: small Packing material: none Patient tolerance: Patient tolerated the procedure well with no immediate complications.  Patient / Family / Caregiver informed of clinical course, understand medical decision-making process, and agree with plan.Pt stable in ED with no significant deterioration in condition. Labs Review Labs Reviewed  CBC WITH DIFFERENTIAL - Abnormal; Notable for the following:    RBC 4.15 (*)    Hemoglobin 12.2 (*)    HCT 37.8 (*)    RDW 15.8 (*)    Neutrophils Relative % 79 (*)    Neutro Abs 7.8 (*)    All  other components within normal limits  COMPREHENSIVE METABOLIC PANEL - Abnormal; Notable for the following:    Sodium 132 (*)    Chloride 95 (*)    Glucose, Bld 115 (*)    Creatinine, Ser 0.40 (*)    Albumin 2.7 (*)    Alkaline Phosphatase 119 (*)    All other components within normal limits  URINALYSIS, ROUTINE W REFLEX MICROSCOPIC - Abnormal; Notable for the following:    Hgb urine dipstick MODERATE (*)    All other components within normal limits  URINE MICROSCOPIC-ADD ON - Abnormal; Notable for the following:    Squamous Epithelial / LPF FEW (*)    All other components within normal limits  CULTURE, BLOOD (ROUTINE X 2)  CULTURE, BLOOD  (ROUTINE X 2)  URINE CULTURE  WOUND CULTURE  I-STAT CG4 LACTIC ACID, ED   Imaging Review Dg Chest 1 View  06/17/2013   CLINICAL DATA:  Fever MRI leukocyte is is left hip pain and postsurgical wound  EXAM: CHEST - 1 VIEW  COMPARISON:  Chest x-ray 06/05/2013  FINDINGS: Stable position of right upper extremity approach PICC. Catheter tip projects over the superior cavoatrial junction. Unchanged cardiac and mediastinal contours. Mild cardiomegaly. Atherosclerotic calcifications in the transverse aorta. Inspiratory volumes are lower and there is increased linear and patchy opacity in the right lung base favored to reflect atelectasis. The lower inspiratory volumes ulcer results in pulmonary vascular crowding. No overt edema. No pleural effusion or pneumothorax. Surgical clips in the right upper quadrant. No acute osseous abnormality.  IMPRESSION: 1. Low inspiratory volumes with increased right basilar atelectasis. 2. Stable position of right upper extremity PICC.   Electronically Signed   By: Jacqulynn Cadet M.D.   On: 06/17/2013 18:06   Dg Hip Complete Left  06/17/2013   CLINICAL DATA:  Left hip pain, wound drainage  EXAM: LEFT HIP - COMPLETE 2+ VIEW  COMPARISON:  Prior radiographs 05/29/2013  FINDINGS: The postoperative drain and cutaneous skin staples have been removed. There is been a change in configuration of the acetabular component when compared with the prior radiographs. The acetabular component projects more end fossa than previously seen and may be tilted in the antral posterior plane. No significantly increased lucency. No evidence of periprosthetic fracture. No acute fracture. Surgical changes of prior right is total hip arthroplasty.  IMPRESSION: 1. Apparent interval change in configuration of the acetabular component of the left total hip arthroplasty prosthesis. The acetabular cup appears more and fossa than previously seen suggesting rotation in the anterior-posterior plane. This could be due  in part to differences in patient positioning.   Electronically Signed   By: Jacqulynn Cadet M.D.   On: 06/17/2013 18:08     EKG Interpretation None      MDM   Final diagnoses:  Postoperative wound infection of left hip    I doubt any other EMC precluding discharge at this time including, but not necessarily limited to the following:sepsis.    Babette Relic, MD 06/19/13 [phone]

## 2013-06-17 NOTE — ED Notes (Signed)
Pt provided jello per request after okay with MD

## 2013-06-18 DIAGNOSIS — E871 Hypo-osmolality and hyponatremia: Secondary | ICD-10-CM | POA: Diagnosis not present

## 2013-06-18 DIAGNOSIS — M79609 Pain in unspecified limb: Secondary | ICD-10-CM | POA: Diagnosis not present

## 2013-06-18 DIAGNOSIS — L89109 Pressure ulcer of unspecified part of back, unspecified stage: Secondary | ICD-10-CM | POA: Diagnosis not present

## 2013-06-18 DIAGNOSIS — L02419 Cutaneous abscess of limb, unspecified: Secondary | ICD-10-CM | POA: Diagnosis not present

## 2013-06-19 LAB — URINE CULTURE
COLONY COUNT: NO GROWTH
CULTURE: NO GROWTH

## 2013-06-20 ENCOUNTER — Telehealth (HOSPITAL_BASED_OUTPATIENT_CLINIC_OR_DEPARTMENT_OTHER): Payer: Self-pay | Admitting: Emergency Medicine

## 2013-06-20 LAB — WOUND CULTURE: Gram Stain: NONE SEEN

## 2013-06-20 NOTE — Telephone Encounter (Signed)
Post ED Visit - Positive Culture Follow-up  Culture report reviewed by antimicrobial stewardship pharmacist: []  Wes Mendon, Pharm.D., BCPS [x]  Heide Guile, Pharm.D., BCPS []  Alycia Rossetti, Pharm.D., BCPS []  [place], [place].D., BCPS, AAHIVP []  Legrand Como, Pharm.D., BCPS, AAHIVP  Positive wound culture Per Vernie Murders PA-C, fax results to [facility] in [place].  Matthew Saras, Rex Kras 06/20/2013, 10:58 AM

## 2013-06-22 DIAGNOSIS — L02419 Cutaneous abscess of limb, unspecified: Secondary | ICD-10-CM | POA: Diagnosis not present

## 2013-06-22 DIAGNOSIS — T8140XA Infection following a procedure, unspecified, initial encounter: Secondary | ICD-10-CM | POA: Diagnosis not present

## 2013-06-22 DIAGNOSIS — S72009A Fracture of unspecified part of neck of unspecified femur, initial encounter for closed fracture: Secondary | ICD-10-CM | POA: Diagnosis not present

## 2013-06-22 DIAGNOSIS — L89109 Pressure ulcer of unspecified part of back, unspecified stage: Secondary | ICD-10-CM | POA: Diagnosis not present

## 2013-06-22 LAB — CULTURE, BLOOD (ROUTINE X 2)
CULTURE: NO GROWTH
Culture: NO GROWTH

## 2013-06-22 NOTE — Telephone Encounter (Signed)
Results faxed to [facility] in [place]. [phone])

## 2013-06-24 DIAGNOSIS — L8992 Pressure ulcer of unspecified site, stage 2: Secondary | ICD-10-CM | POA: Diagnosis not present

## 2013-06-24 DIAGNOSIS — L89109 Pressure ulcer of unspecified part of back, unspecified stage: Secondary | ICD-10-CM | POA: Diagnosis not present

## 2013-06-26 DIAGNOSIS — M6281 Muscle weakness (generalized): Secondary | ICD-10-CM | POA: Diagnosis not present

## 2013-06-26 DIAGNOSIS — E871 Hypo-osmolality and hyponatremia: Secondary | ICD-10-CM | POA: Diagnosis not present

## 2013-06-26 DIAGNOSIS — E876 Hypokalemia: Secondary | ICD-10-CM | POA: Diagnosis not present

## 2013-06-26 DIAGNOSIS — L89109 Pressure ulcer of unspecified part of back, unspecified stage: Secondary | ICD-10-CM | POA: Diagnosis not present

## 2013-06-26 DIAGNOSIS — M79609 Pain in unspecified limb: Secondary | ICD-10-CM | POA: Diagnosis not present

## 2013-06-26 DIAGNOSIS — L02419 Cutaneous abscess of limb, unspecified: Secondary | ICD-10-CM | POA: Diagnosis not present

## 2013-07-02 DIAGNOSIS — L03119 Cellulitis of unspecified part of limb: Secondary | ICD-10-CM | POA: Diagnosis not present

## 2013-07-02 DIAGNOSIS — R319 Hematuria, unspecified: Secondary | ICD-10-CM | POA: Diagnosis not present

## 2013-07-02 DIAGNOSIS — L02419 Cutaneous abscess of limb, unspecified: Secondary | ICD-10-CM | POA: Diagnosis not present

## 2013-07-06 ENCOUNTER — Ambulatory Visit (HOSPITAL_COMMUNITY)
Admission: RE | Admit: 2013-07-06 | Discharge: 2013-07-06 | Disposition: A | Discharge status: Home / Self Care | Payer: Medicare Other | Source: Ambulatory Visit | Attending: Internal Medicine | Admitting: Internal Medicine

## 2013-07-06 DIAGNOSIS — T82898A Other specified complication of vascular prosthetic devices, implants and grafts, initial encounter: Secondary | ICD-10-CM | POA: Insufficient documentation

## 2013-07-06 DIAGNOSIS — Y849 Medical procedure, unspecified as the cause of abnormal reaction of the patient, or of later complication, without mention of misadventure at the time of the procedure: Secondary | ICD-10-CM | POA: Insufficient documentation

## 2013-07-06 MED ORDER — ALTEPLASE 2 MG IJ SOLR
2.0000 mg | Freq: Two times a day (BID) | INTRAMUSCULAR | Status: DC | PRN
  Administered 2013-07-06 (×2): 2 mg
  Filled 2013-07-06: qty 2

## 2013-07-06 MED ORDER — STERILE WATER FOR INJECTION IJ SOLN
INTRAMUSCULAR | Status: AC
  Filled 2013-07-06: qty 10

## 2013-07-06 MED ORDER — SODIUM CHLORIDE 0.9 % IJ SOLN: 10.0000 mL | INTRAMUSCULAR | Status: DC | PRN

## 2013-07-06 MED ORDER — ALTEPLASE 2 MG IJ SOLR
INTRAMUSCULAR | Status: AC
  Filled 2013-07-06: qty 2

## 2013-07-06 NOTE — Progress Notes (Signed)
Send back via [facility] transportation. Reported to [facility] staff nurse that he would return [place].

## 2013-07-06 NOTE — Progress Notes (Signed)
TPA injected. Patient to wait for around an hour and half to see if med work

## 2013-07-06 NOTE — Progress Notes (Signed)
Patient here for TPA to declot picc line. Site assess picc at 0 no signs of infection or infiltration. Patient stable verbilized understanding of what i was going to do.

## 2013-07-06 NOTE — Progress Notes (Signed)
Patient  picc flushing better ok to receive antibiotics still no blood return. Patient to return tomorrow for picc placement.

## 2013-07-07 ENCOUNTER — Ambulatory Visit (HOSPITAL_COMMUNITY)
Admission: RE | Admit: 2013-07-07 | Discharge: 2013-07-07 | Disposition: A | Discharge status: Home / Self Care | Payer: Medicare Other | Source: Ambulatory Visit | Attending: Internal Medicine | Admitting: Internal Medicine

## 2013-07-07 DIAGNOSIS — Y839 Surgical procedure, unspecified as the cause of abnormal reaction of the patient, or of later complication, without mention of misadventure at the time of the procedure: Secondary | ICD-10-CM | POA: Insufficient documentation

## 2013-07-07 DIAGNOSIS — T8140XA Infection following a procedure, unspecified, initial encounter: Secondary | ICD-10-CM | POA: Insufficient documentation

## 2013-07-07 DIAGNOSIS — J9819 Other pulmonary collapse: Secondary | ICD-10-CM | POA: Diagnosis not present

## 2013-07-07 MED ORDER — SODIUM CHLORIDE 0.9 % IJ SOLN: 10.0000 mL | INTRAMUSCULAR | Status: DC | PRN

## 2013-07-07 MED ORDER — SODIUM CHLORIDE 0.9 % IJ SOLN: 10.0000 mL | Freq: Two times a day (BID) | INTRAMUSCULAR | Status: DC

## 2013-07-07 NOTE — Discharge Instructions (Signed)
PICC Home Guide A peripherally inserted central catheter (PICC) is a long, thin, flexible tube that is inserted into a vein in the upper arm. It is a form of intravenous (IV) access. It is considered to be a "central" line because the tip of the PICC ends in a large vein in your chest. This large vein is called the superior vena cava (SVC). The PICC tip ends in the SVC because there is a lot of blood flow in the SVC. This allows medicines and IV fluids to be quickly distributed throughout the body. The PICC is inserted using a sterile technique by a specially trained nurse or physician. After the PICC is inserted, a chest X-ray exam is done to be sure it is in the correct place.  A PICC may be placed for different reasons, such as:  To give medicines and liquid nutrition that can only be given through a central line. Examples are:  Certain antibiotic treatments.  Chemotherapy.  Total parenteral nutrition (TPN).  To take frequent blood samples.  To give IV fluids and blood products.  If there is difficulty placing a peripheral intravenous (PIV) catheter. If taken care of properly, a PICC can remain in place for several months. A PICC can also allow a person to go home from the hospital early. Medicine and PICC care can be managed at home by a family member or home health care team. WHAT PROBLEMS CAN HAPPEN WHEN I HAVE A PICC? Problems with a PICC can occasionally occur. These may include:  A blood clot (thrombus) forming in or at the tip of the PICC. This can cause the PICC to become clogged. A clot-dissolving medicine called tissue plasminogen activator (tPA) can be given through the PICC to help break up the clot.  Inflammation of the vein (phlebitis) in which the PICC is placed. Signs of inflammation may include redness, pain at the insertion site, red streaks, or being able to feel a "cord" in the vein where the PICC is located.  Infection in the PICC or at the insertion site. Signs of  infection may include fever, chills, redness, swelling, or pus drainage from the PICC insertion site.  PICC movement (malposition). The PICC tip may move from its original position due to excessive physical activity, forceful coughing, sneezing, or vomiting.  A break or cut in the PICC. It is important to not use scissors near the PICC.  Nerve or tendon irritation or injury during PICC insertion. WHAT SHOULD I KEEP IN MIND ABOUT ACTIVITIES WHEN I HAVE A PICC?  You may bend your arm and move it freely. If your PICC is near or at the bend of your elbow, avoid activity with repeated motion at the elbow.  Rest at home for the remainder of the day following PICC line insertion.  Avoid lifting heavy objects as instructed by your health care provider.  Avoid using a crutch with the arm on the same side as your PICC. You may need to use a walker. WHAT SHOULD I KNOW ABOUT MY PICC DRESSING?  Keep your PICC bandage (dressing) clean and dry to prevent infection.  Ask your health care provider when you may shower. Ask your health care provider to teach you how to wrap the PICC when you do take a shower.  Change the PICC dressing as instructed by your health care provider.  Change your PICC dressing if it becomes loose or wet. WHAT SHOULD I KNOW ABOUT PICC CARE?  Check the PICC insertion site daily for   leakage, redness, swelling, or pain.  Do not take a bath, swim, or use hot tubs when you have a PICC. Cover PICC line with clear plastic wrap and tape to keep it dry while showering.  Flush the PICC as directed by your health care provider. Let your health care provider know right away if the PICC is difficult to flush or does not flush. Do not use force to flush the PICC.  Do not use a syringe that is less than 10 mL to flush the PICC.  Never pull or tug on the PICC.  Avoid blood pressure checks on the arm with the PICC.  Keep your PICC identification card with you at all times.  Do not  take the PICC out yourself. Only a trained clinical professional should remove the PICC. SEEK IMMEDIATE MEDICAL CARE IF:  Your PICC is accidently pulled all the way out. If this happens, cover the insertion site with a bandage or gauze dressing. Do not throw the PICC away. Your health care provider will need to inspect it.  Your PICC was tugged or pulled and has partially come out. Do not  push the PICC back in.  There is any type of drainage, redness, or swelling where the PICC enters the skin.  You cannot flush the PICC, it is difficult to flush, or the PICC leaks around the insertion site when it is flushed.  You hear a "flushing" sound when the PICC is flushed.  You have pain, discomfort, or numbness in your arm, shoulder, or jaw on the same side as the PICC.  You feel your heart "racing" or skipping beats.  You notice a hole or tear in the PICC.  You develop chills or a fever. MAKE SURE YOU:   Understand these instructions.  Will watch your condition.  Will get help right away if you are not doing well or get worse. Document Released: 09/09/2002 Document Revised: 12/24/2012 Document Reviewed: 11/10/2012 [employer] Patient Information 2014 [employer].  

## 2013-07-07 NOTE — Progress Notes (Signed)
Peripherally Inserted Central Catheter/Midline Placement  The IV Nurse has discussed with the patient and/or persons authorized to consent for the patient, the purpose of this procedure and the potential benefits and risks involved with this procedure.  The benefits include less needle sticks, lab draws from the catheter and patient may be discharged home with the catheter.  Risks include, but not limited to, infection, bleeding, blood clot (thrombus formation), and puncture of an artery; nerve damage and irregular heat beat.  Alternatives to this procedure were also discussed.  PICC/Midline Placement Documentation  PICC / Midline Single Lumen 0000000 PICC Right Basilic 36 cm 0 cm (Active)     PICC / Midline Single Lumen 07/07/13 (Active)  Indication for Insertion or Continuance of Line Prolonged intravenous therapies 07/07/2013 12:15 PM  Site Assessment Clean;Dry;Intact 07/07/2013 12:15 PM  Line Status Flushed;Saline locked;Capped (central line);Blood return noted 07/07/2013 12:15 PM  Dressing Type Transparent 07/07/2013 12:15 PM  Dressing Status Clean;Dry;Intact 07/07/2013 12:15 PM  Dressing Change Due 07/14/13 07/07/2013 12:15 PM   Patient arrived to receive picc line. Attempted again to flush and get blood return without success from existing picc. picc removed from right arm with 36 cm showing on tip verified by prior picc placement. No consent needed new picc being placed within 30 days of last picc.     Roselind Messier 07/07/2013, 2:04 PM

## 2013-07-07 NOTE — Progress Notes (Signed)
Patient returned to [facility] via [facility] transportation. Sent care instruction and chest xray report verifying placement of picc. Call and reportefd to [facility] lpn

## 2013-07-09 DIAGNOSIS — S72009A Fracture of unspecified part of neck of unspecified femur, initial encounter for closed fracture: Secondary | ICD-10-CM | POA: Diagnosis not present

## 2013-07-15 DIAGNOSIS — K5900 Constipation, unspecified: Secondary | ICD-10-CM | POA: Diagnosis not present

## 2013-07-21 DIAGNOSIS — N400 Enlarged prostate without lower urinary tract symptoms: Secondary | ICD-10-CM | POA: Diagnosis not present

## 2013-07-21 DIAGNOSIS — R1310 Dysphagia, unspecified: Secondary | ICD-10-CM | POA: Diagnosis not present

## 2013-07-21 DIAGNOSIS — E876 Hypokalemia: Secondary | ICD-10-CM | POA: Diagnosis not present

## 2013-07-21 DIAGNOSIS — L03119 Cellulitis of unspecified part of limb: Secondary | ICD-10-CM | POA: Diagnosis not present

## 2013-07-21 DIAGNOSIS — L02419 Cutaneous abscess of limb, unspecified: Secondary | ICD-10-CM | POA: Diagnosis not present

## 2013-07-21 DIAGNOSIS — L89109 Pressure ulcer of unspecified part of back, unspecified stage: Secondary | ICD-10-CM | POA: Diagnosis not present

## 2013-07-21 DIAGNOSIS — E871 Hypo-osmolality and hyponatremia: Secondary | ICD-10-CM | POA: Diagnosis not present

## 2013-07-23 DIAGNOSIS — E039 Hypothyroidism, unspecified: Secondary | ICD-10-CM | POA: Diagnosis not present

## 2013-07-31 DIAGNOSIS — R319 Hematuria, unspecified: Secondary | ICD-10-CM | POA: Diagnosis not present

## 2013-07-31 DIAGNOSIS — L89109 Pressure ulcer of unspecified part of back, unspecified stage: Secondary | ICD-10-CM | POA: Diagnosis not present

## 2013-07-31 DIAGNOSIS — N400 Enlarged prostate without lower urinary tract symptoms: Secondary | ICD-10-CM | POA: Diagnosis not present

## 2013-08-03 DIAGNOSIS — N390 Urinary tract infection, site not specified: Secondary | ICD-10-CM | POA: Diagnosis not present

## 2013-08-03 DIAGNOSIS — R339 Retention of urine, unspecified: Secondary | ICD-10-CM | POA: Diagnosis not present

## 2013-08-05 DIAGNOSIS — Z471 Aftercare following joint replacement surgery: Secondary | ICD-10-CM | POA: Diagnosis not present

## 2013-08-05 DIAGNOSIS — Z96649 Presence of unspecified artificial hip joint: Secondary | ICD-10-CM | POA: Diagnosis not present

## 2013-08-05 DIAGNOSIS — Z8612 Personal history of poliomyelitis: Secondary | ICD-10-CM | POA: Diagnosis not present

## 2013-08-05 DIAGNOSIS — S72009A Fracture of unspecified part of neck of unspecified femur, initial encounter for closed fracture: Secondary | ICD-10-CM | POA: Diagnosis not present

## 2013-08-05 DIAGNOSIS — M25569 Pain in unspecified knee: Secondary | ICD-10-CM | POA: Diagnosis not present

## 2013-08-26 DIAGNOSIS — L02419 Cutaneous abscess of limb, unspecified: Secondary | ICD-10-CM | POA: Diagnosis not present

## 2013-08-26 DIAGNOSIS — R262 Difficulty in walking, not elsewhere classified: Secondary | ICD-10-CM | POA: Diagnosis not present

## 2013-08-26 DIAGNOSIS — M6281 Muscle weakness (generalized): Secondary | ICD-10-CM | POA: Diagnosis not present

## 2013-08-27 DIAGNOSIS — L02419 Cutaneous abscess of limb, unspecified: Secondary | ICD-10-CM | POA: Diagnosis not present

## 2013-08-27 DIAGNOSIS — R262 Difficulty in walking, not elsewhere classified: Secondary | ICD-10-CM | POA: Diagnosis not present

## 2013-08-27 DIAGNOSIS — M6281 Muscle weakness (generalized): Secondary | ICD-10-CM | POA: Diagnosis not present

## 2013-08-28 DIAGNOSIS — R262 Difficulty in walking, not elsewhere classified: Secondary | ICD-10-CM | POA: Diagnosis not present

## 2013-08-28 DIAGNOSIS — M6281 Muscle weakness (generalized): Secondary | ICD-10-CM | POA: Diagnosis not present

## 2013-08-28 DIAGNOSIS — L02419 Cutaneous abscess of limb, unspecified: Secondary | ICD-10-CM | POA: Diagnosis not present

## 2013-08-31 DIAGNOSIS — R262 Difficulty in walking, not elsewhere classified: Secondary | ICD-10-CM | POA: Diagnosis not present

## 2013-08-31 DIAGNOSIS — M6281 Muscle weakness (generalized): Secondary | ICD-10-CM | POA: Diagnosis not present

## 2013-08-31 DIAGNOSIS — L02419 Cutaneous abscess of limb, unspecified: Secondary | ICD-10-CM | POA: Diagnosis not present

## 2013-09-01 DIAGNOSIS — M6281 Muscle weakness (generalized): Secondary | ICD-10-CM | POA: Diagnosis not present

## 2013-09-01 DIAGNOSIS — R262 Difficulty in walking, not elsewhere classified: Secondary | ICD-10-CM | POA: Diagnosis not present

## 2013-09-01 DIAGNOSIS — L02419 Cutaneous abscess of limb, unspecified: Secondary | ICD-10-CM | POA: Diagnosis not present

## 2013-09-02 DIAGNOSIS — M6281 Muscle weakness (generalized): Secondary | ICD-10-CM | POA: Diagnosis not present

## 2013-09-02 DIAGNOSIS — R262 Difficulty in walking, not elsewhere classified: Secondary | ICD-10-CM | POA: Diagnosis not present

## 2013-09-02 DIAGNOSIS — L02419 Cutaneous abscess of limb, unspecified: Secondary | ICD-10-CM | POA: Diagnosis not present

## 2013-09-03 DIAGNOSIS — M6281 Muscle weakness (generalized): Secondary | ICD-10-CM | POA: Diagnosis not present

## 2013-09-03 DIAGNOSIS — L02419 Cutaneous abscess of limb, unspecified: Secondary | ICD-10-CM | POA: Diagnosis not present

## 2013-09-03 DIAGNOSIS — R262 Difficulty in walking, not elsewhere classified: Secondary | ICD-10-CM | POA: Diagnosis not present

## 2013-09-03 DIAGNOSIS — E039 Hypothyroidism, unspecified: Secondary | ICD-10-CM | POA: Diagnosis not present

## 2013-09-04 DIAGNOSIS — M6281 Muscle weakness (generalized): Secondary | ICD-10-CM | POA: Diagnosis not present

## 2013-09-04 DIAGNOSIS — L02419 Cutaneous abscess of limb, unspecified: Secondary | ICD-10-CM | POA: Diagnosis not present

## 2013-09-04 DIAGNOSIS — R262 Difficulty in walking, not elsewhere classified: Secondary | ICD-10-CM | POA: Diagnosis not present

## 2013-09-07 DIAGNOSIS — L02419 Cutaneous abscess of limb, unspecified: Secondary | ICD-10-CM | POA: Diagnosis not present

## 2013-09-07 DIAGNOSIS — R339 Retention of urine, unspecified: Secondary | ICD-10-CM | POA: Diagnosis not present

## 2013-09-07 DIAGNOSIS — R262 Difficulty in walking, not elsewhere classified: Secondary | ICD-10-CM | POA: Diagnosis not present

## 2013-09-07 DIAGNOSIS — N400 Enlarged prostate without lower urinary tract symptoms: Secondary | ICD-10-CM | POA: Diagnosis not present

## 2013-09-07 DIAGNOSIS — N390 Urinary tract infection, site not specified: Secondary | ICD-10-CM | POA: Diagnosis not present

## 2013-09-07 DIAGNOSIS — M6281 Muscle weakness (generalized): Secondary | ICD-10-CM | POA: Diagnosis not present

## 2013-09-08 DIAGNOSIS — M6281 Muscle weakness (generalized): Secondary | ICD-10-CM | POA: Diagnosis not present

## 2013-09-08 DIAGNOSIS — L02419 Cutaneous abscess of limb, unspecified: Secondary | ICD-10-CM | POA: Diagnosis not present

## 2013-09-08 DIAGNOSIS — R262 Difficulty in walking, not elsewhere classified: Secondary | ICD-10-CM | POA: Diagnosis not present

## 2013-09-09 DIAGNOSIS — L02419 Cutaneous abscess of limb, unspecified: Secondary | ICD-10-CM | POA: Diagnosis not present

## 2013-09-09 DIAGNOSIS — M6281 Muscle weakness (generalized): Secondary | ICD-10-CM | POA: Diagnosis not present

## 2013-09-09 DIAGNOSIS — R262 Difficulty in walking, not elsewhere classified: Secondary | ICD-10-CM | POA: Diagnosis not present

## 2013-09-10 DIAGNOSIS — M6281 Muscle weakness (generalized): Secondary | ICD-10-CM | POA: Diagnosis not present

## 2013-09-10 DIAGNOSIS — R262 Difficulty in walking, not elsewhere classified: Secondary | ICD-10-CM | POA: Diagnosis not present

## 2013-09-10 DIAGNOSIS — L02419 Cutaneous abscess of limb, unspecified: Secondary | ICD-10-CM | POA: Diagnosis not present

## 2013-09-11 DIAGNOSIS — R262 Difficulty in walking, not elsewhere classified: Secondary | ICD-10-CM | POA: Diagnosis not present

## 2013-09-11 DIAGNOSIS — L02419 Cutaneous abscess of limb, unspecified: Secondary | ICD-10-CM | POA: Diagnosis not present

## 2013-09-11 DIAGNOSIS — M6281 Muscle weakness (generalized): Secondary | ICD-10-CM | POA: Diagnosis not present

## 2013-09-14 DIAGNOSIS — M6281 Muscle weakness (generalized): Secondary | ICD-10-CM | POA: Diagnosis not present

## 2013-09-14 DIAGNOSIS — L02419 Cutaneous abscess of limb, unspecified: Secondary | ICD-10-CM | POA: Diagnosis not present

## 2013-09-14 DIAGNOSIS — R262 Difficulty in walking, not elsewhere classified: Secondary | ICD-10-CM | POA: Diagnosis not present

## 2013-09-15 DIAGNOSIS — L02419 Cutaneous abscess of limb, unspecified: Secondary | ICD-10-CM | POA: Diagnosis not present

## 2013-09-15 DIAGNOSIS — M6281 Muscle weakness (generalized): Secondary | ICD-10-CM | POA: Diagnosis not present

## 2013-09-15 DIAGNOSIS — R262 Difficulty in walking, not elsewhere classified: Secondary | ICD-10-CM | POA: Diagnosis not present

## 2013-09-16 DIAGNOSIS — M6281 Muscle weakness (generalized): Secondary | ICD-10-CM | POA: Diagnosis not present

## 2013-09-16 DIAGNOSIS — L02419 Cutaneous abscess of limb, unspecified: Secondary | ICD-10-CM | POA: Diagnosis not present

## 2013-09-16 DIAGNOSIS — R262 Difficulty in walking, not elsewhere classified: Secondary | ICD-10-CM | POA: Diagnosis not present

## 2013-09-17 DIAGNOSIS — L02419 Cutaneous abscess of limb, unspecified: Secondary | ICD-10-CM | POA: Diagnosis not present

## 2013-09-17 DIAGNOSIS — M6281 Muscle weakness (generalized): Secondary | ICD-10-CM | POA: Diagnosis not present

## 2013-09-17 DIAGNOSIS — R32 Unspecified urinary incontinence: Secondary | ICD-10-CM | POA: Diagnosis not present

## 2013-09-17 DIAGNOSIS — N400 Enlarged prostate without lower urinary tract symptoms: Secondary | ICD-10-CM | POA: Diagnosis not present

## 2013-09-17 DIAGNOSIS — R262 Difficulty in walking, not elsewhere classified: Secondary | ICD-10-CM | POA: Diagnosis not present

## 2013-09-18 DIAGNOSIS — M6281 Muscle weakness (generalized): Secondary | ICD-10-CM | POA: Diagnosis not present

## 2013-09-18 DIAGNOSIS — L02419 Cutaneous abscess of limb, unspecified: Secondary | ICD-10-CM | POA: Diagnosis not present

## 2013-09-18 DIAGNOSIS — L03119 Cellulitis of unspecified part of limb: Secondary | ICD-10-CM | POA: Diagnosis not present

## 2013-09-18 DIAGNOSIS — R262 Difficulty in walking, not elsewhere classified: Secondary | ICD-10-CM | POA: Diagnosis not present

## 2013-09-21 DIAGNOSIS — L02419 Cutaneous abscess of limb, unspecified: Secondary | ICD-10-CM | POA: Diagnosis not present

## 2013-09-21 DIAGNOSIS — M6281 Muscle weakness (generalized): Secondary | ICD-10-CM | POA: Diagnosis not present

## 2013-09-21 DIAGNOSIS — R262 Difficulty in walking, not elsewhere classified: Secondary | ICD-10-CM | POA: Diagnosis not present

## 2013-09-22 DIAGNOSIS — L03119 Cellulitis of unspecified part of limb: Secondary | ICD-10-CM | POA: Diagnosis not present

## 2013-09-22 DIAGNOSIS — M6281 Muscle weakness (generalized): Secondary | ICD-10-CM | POA: Diagnosis not present

## 2013-09-22 DIAGNOSIS — R262 Difficulty in walking, not elsewhere classified: Secondary | ICD-10-CM | POA: Diagnosis not present

## 2013-09-22 DIAGNOSIS — L02419 Cutaneous abscess of limb, unspecified: Secondary | ICD-10-CM | POA: Diagnosis not present

## 2013-09-23 DIAGNOSIS — R262 Difficulty in walking, not elsewhere classified: Secondary | ICD-10-CM | POA: Diagnosis not present

## 2013-09-23 DIAGNOSIS — M6281 Muscle weakness (generalized): Secondary | ICD-10-CM | POA: Diagnosis not present

## 2013-09-23 DIAGNOSIS — L02419 Cutaneous abscess of limb, unspecified: Secondary | ICD-10-CM | POA: Diagnosis not present

## 2013-09-25 DIAGNOSIS — L02419 Cutaneous abscess of limb, unspecified: Secondary | ICD-10-CM | POA: Diagnosis not present

## 2013-09-25 DIAGNOSIS — R262 Difficulty in walking, not elsewhere classified: Secondary | ICD-10-CM | POA: Diagnosis not present

## 2013-09-25 DIAGNOSIS — M6281 Muscle weakness (generalized): Secondary | ICD-10-CM | POA: Diagnosis not present

## 2013-09-28 DIAGNOSIS — M6281 Muscle weakness (generalized): Secondary | ICD-10-CM | POA: Diagnosis not present

## 2013-09-28 DIAGNOSIS — L02419 Cutaneous abscess of limb, unspecified: Secondary | ICD-10-CM | POA: Diagnosis not present

## 2013-09-28 DIAGNOSIS — R262 Difficulty in walking, not elsewhere classified: Secondary | ICD-10-CM | POA: Diagnosis not present

## 2013-09-29 DIAGNOSIS — L02419 Cutaneous abscess of limb, unspecified: Secondary | ICD-10-CM | POA: Diagnosis not present

## 2013-09-29 DIAGNOSIS — M6281 Muscle weakness (generalized): Secondary | ICD-10-CM | POA: Diagnosis not present

## 2013-09-29 DIAGNOSIS — R262 Difficulty in walking, not elsewhere classified: Secondary | ICD-10-CM | POA: Diagnosis not present

## 2013-09-29 DIAGNOSIS — L03119 Cellulitis of unspecified part of limb: Secondary | ICD-10-CM | POA: Diagnosis not present

## 2013-09-30 DIAGNOSIS — R262 Difficulty in walking, not elsewhere classified: Secondary | ICD-10-CM | POA: Diagnosis not present

## 2013-09-30 DIAGNOSIS — M6281 Muscle weakness (generalized): Secondary | ICD-10-CM | POA: Diagnosis not present

## 2013-09-30 DIAGNOSIS — L02419 Cutaneous abscess of limb, unspecified: Secondary | ICD-10-CM | POA: Diagnosis not present

## 2013-10-01 DIAGNOSIS — M6281 Muscle weakness (generalized): Secondary | ICD-10-CM | POA: Diagnosis not present

## 2013-10-01 DIAGNOSIS — L03119 Cellulitis of unspecified part of limb: Secondary | ICD-10-CM | POA: Diagnosis not present

## 2013-10-01 DIAGNOSIS — N400 Enlarged prostate without lower urinary tract symptoms: Secondary | ICD-10-CM | POA: Diagnosis not present

## 2013-10-01 DIAGNOSIS — R262 Difficulty in walking, not elsewhere classified: Secondary | ICD-10-CM | POA: Diagnosis not present

## 2013-10-01 DIAGNOSIS — L02419 Cutaneous abscess of limb, unspecified: Secondary | ICD-10-CM | POA: Diagnosis not present

## 2013-10-02 DIAGNOSIS — L02419 Cutaneous abscess of limb, unspecified: Secondary | ICD-10-CM | POA: Diagnosis not present

## 2013-10-02 DIAGNOSIS — M6281 Muscle weakness (generalized): Secondary | ICD-10-CM | POA: Diagnosis not present

## 2013-10-02 DIAGNOSIS — R262 Difficulty in walking, not elsewhere classified: Secondary | ICD-10-CM | POA: Diagnosis not present

## 2013-10-05 DIAGNOSIS — L02419 Cutaneous abscess of limb, unspecified: Secondary | ICD-10-CM | POA: Diagnosis not present

## 2013-10-05 DIAGNOSIS — R262 Difficulty in walking, not elsewhere classified: Secondary | ICD-10-CM | POA: Diagnosis not present

## 2013-10-05 DIAGNOSIS — M6281 Muscle weakness (generalized): Secondary | ICD-10-CM | POA: Diagnosis not present

## 2013-10-06 DIAGNOSIS — L02419 Cutaneous abscess of limb, unspecified: Secondary | ICD-10-CM | POA: Diagnosis not present

## 2013-10-06 DIAGNOSIS — M6281 Muscle weakness (generalized): Secondary | ICD-10-CM | POA: Diagnosis not present

## 2013-10-06 DIAGNOSIS — R262 Difficulty in walking, not elsewhere classified: Secondary | ICD-10-CM | POA: Diagnosis not present

## 2013-10-06 DIAGNOSIS — N390 Urinary tract infection, site not specified: Secondary | ICD-10-CM | POA: Diagnosis not present

## 2013-10-07 DIAGNOSIS — R262 Difficulty in walking, not elsewhere classified: Secondary | ICD-10-CM | POA: Diagnosis not present

## 2013-10-07 DIAGNOSIS — N390 Urinary tract infection, site not specified: Secondary | ICD-10-CM | POA: Diagnosis not present

## 2013-10-07 DIAGNOSIS — M6281 Muscle weakness (generalized): Secondary | ICD-10-CM | POA: Diagnosis not present

## 2013-10-07 DIAGNOSIS — L02419 Cutaneous abscess of limb, unspecified: Secondary | ICD-10-CM | POA: Diagnosis not present

## 2013-10-12 DIAGNOSIS — S71109A Unspecified open wound, unspecified thigh, initial encounter: Secondary | ICD-10-CM | POA: Diagnosis not present

## 2013-10-12 DIAGNOSIS — Z96649 Presence of unspecified artificial hip joint: Secondary | ICD-10-CM | POA: Diagnosis not present

## 2013-10-12 DIAGNOSIS — N390 Urinary tract infection, site not specified: Secondary | ICD-10-CM | POA: Diagnosis not present

## 2013-10-12 DIAGNOSIS — N400 Enlarged prostate without lower urinary tract symptoms: Secondary | ICD-10-CM | POA: Diagnosis not present

## 2013-10-12 DIAGNOSIS — S71009A Unspecified open wound, unspecified hip, initial encounter: Secondary | ICD-10-CM | POA: Diagnosis not present

## 2013-10-12 DIAGNOSIS — M6281 Muscle weakness (generalized): Secondary | ICD-10-CM | POA: Diagnosis not present

## 2013-10-13 DIAGNOSIS — Y838 Other surgical procedures as the cause of abnormal reaction of the patient, or of later complication, without mention of misadventure at the time of the procedure: Secondary | ICD-10-CM | POA: Diagnosis not present

## 2013-10-13 DIAGNOSIS — E440 Moderate protein-calorie malnutrition: Secondary | ICD-10-CM | POA: Diagnosis not present

## 2013-10-13 DIAGNOSIS — M00859 Arthritis due to other bacteria, unspecified hip: Secondary | ICD-10-CM | POA: Diagnosis not present

## 2013-10-13 DIAGNOSIS — T8140XA Infection following a procedure, unspecified, initial encounter: Secondary | ICD-10-CM | POA: Diagnosis not present

## 2013-10-13 DIAGNOSIS — K5900 Constipation, unspecified: Secondary | ICD-10-CM | POA: Diagnosis present

## 2013-10-13 DIAGNOSIS — Z79899 Other long term (current) drug therapy: Secondary | ICD-10-CM | POA: Diagnosis not present

## 2013-10-13 DIAGNOSIS — K219 Gastro-esophageal reflux disease without esophagitis: Secondary | ICD-10-CM | POA: Diagnosis present

## 2013-10-13 DIAGNOSIS — Z96649 Presence of unspecified artificial hip joint: Secondary | ICD-10-CM | POA: Diagnosis not present

## 2013-10-13 DIAGNOSIS — IMO0002 Reserved for concepts with insufficient information to code with codable children: Secondary | ICD-10-CM | POA: Diagnosis not present

## 2013-10-13 DIAGNOSIS — L02419 Cutaneous abscess of limb, unspecified: Secondary | ICD-10-CM | POA: Diagnosis present

## 2013-10-13 DIAGNOSIS — M25559 Pain in unspecified hip: Secondary | ICD-10-CM | POA: Diagnosis not present

## 2013-10-13 DIAGNOSIS — S71009A Unspecified open wound, unspecified hip, initial encounter: Secondary | ICD-10-CM | POA: Diagnosis not present

## 2013-10-13 DIAGNOSIS — Z883 Allergy status to other anti-infective agents status: Secondary | ICD-10-CM | POA: Diagnosis not present

## 2013-10-13 DIAGNOSIS — E785 Hyperlipidemia, unspecified: Secondary | ICD-10-CM | POA: Diagnosis not present

## 2013-10-13 DIAGNOSIS — A4901 Methicillin susceptible Staphylococcus aureus infection, unspecified site: Secondary | ICD-10-CM | POA: Diagnosis not present

## 2013-10-13 DIAGNOSIS — Z7401 Bed confinement status: Secondary | ICD-10-CM | POA: Diagnosis not present

## 2013-10-13 DIAGNOSIS — M76899 Other specified enthesopathies of unspecified lower limb, excluding foot: Secondary | ICD-10-CM | POA: Diagnosis not present

## 2013-10-13 DIAGNOSIS — Z7982 Long term (current) use of aspirin: Secondary | ICD-10-CM | POA: Diagnosis not present

## 2013-10-13 DIAGNOSIS — L03119 Cellulitis of unspecified part of limb: Secondary | ICD-10-CM | POA: Diagnosis not present

## 2013-10-13 DIAGNOSIS — N400 Enlarged prostate without lower urinary tract symptoms: Secondary | ICD-10-CM | POA: Diagnosis not present

## 2013-10-13 DIAGNOSIS — E039 Hypothyroidism, unspecified: Secondary | ICD-10-CM | POA: Diagnosis not present

## 2013-10-13 DIAGNOSIS — M2550 Pain in unspecified joint: Secondary | ICD-10-CM | POA: Diagnosis not present

## 2013-10-16 DIAGNOSIS — X58XXXA Exposure to other specified factors, initial encounter: Secondary | ICD-10-CM | POA: Diagnosis not present

## 2013-10-16 DIAGNOSIS — E785 Hyperlipidemia, unspecified: Secondary | ICD-10-CM | POA: Diagnosis present

## 2013-10-16 DIAGNOSIS — K5900 Constipation, unspecified: Secondary | ICD-10-CM | POA: Diagnosis present

## 2013-10-16 DIAGNOSIS — K219 Gastro-esophageal reflux disease without esophagitis: Secondary | ICD-10-CM | POA: Diagnosis present

## 2013-10-16 DIAGNOSIS — S71009A Unspecified open wound, unspecified hip, initial encounter: Secondary | ICD-10-CM | POA: Diagnosis not present

## 2013-10-16 DIAGNOSIS — M2550 Pain in unspecified joint: Secondary | ICD-10-CM | POA: Diagnosis not present

## 2013-10-16 DIAGNOSIS — Z7401 Bed confinement status: Secondary | ICD-10-CM | POA: Diagnosis not present

## 2013-10-16 DIAGNOSIS — N390 Urinary tract infection, site not specified: Secondary | ICD-10-CM | POA: Diagnosis not present

## 2013-10-16 DIAGNOSIS — E871 Hypo-osmolality and hyponatremia: Secondary | ICD-10-CM | POA: Diagnosis not present

## 2013-10-16 DIAGNOSIS — T8140XA Infection following a procedure, unspecified, initial encounter: Secondary | ICD-10-CM | POA: Diagnosis not present

## 2013-10-16 DIAGNOSIS — Z96649 Presence of unspecified artificial hip joint: Secondary | ICD-10-CM | POA: Diagnosis not present

## 2013-10-16 DIAGNOSIS — Z5309 Procedure and treatment not carried out because of other contraindication: Secondary | ICD-10-CM | POA: Diagnosis not present

## 2013-10-16 DIAGNOSIS — Z7982 Long term (current) use of aspirin: Secondary | ICD-10-CM | POA: Diagnosis not present

## 2013-10-16 DIAGNOSIS — S069X9S Unspecified intracranial injury with loss of consciousness of unspecified duration, sequela: Secondary | ICD-10-CM | POA: Diagnosis not present

## 2013-10-16 DIAGNOSIS — Z8612 Personal history of poliomyelitis: Secondary | ICD-10-CM | POA: Diagnosis not present

## 2013-10-16 DIAGNOSIS — H3552 Pigmentary retinal dystrophy: Secondary | ICD-10-CM | POA: Diagnosis present

## 2013-10-16 DIAGNOSIS — S069XAS Unspecified intracranial injury with loss of consciousness status unknown, sequela: Secondary | ICD-10-CM | POA: Diagnosis not present

## 2013-10-16 DIAGNOSIS — N400 Enlarged prostate without lower urinary tract symptoms: Secondary | ICD-10-CM | POA: Diagnosis present

## 2013-10-16 DIAGNOSIS — E440 Moderate protein-calorie malnutrition: Secondary | ICD-10-CM | POA: Diagnosis not present

## 2013-10-16 DIAGNOSIS — Z79899 Other long term (current) drug therapy: Secondary | ICD-10-CM | POA: Diagnosis not present

## 2013-10-16 DIAGNOSIS — IMO0002 Reserved for concepts with insufficient information to code with codable children: Secondary | ICD-10-CM | POA: Diagnosis not present

## 2013-10-16 DIAGNOSIS — T84029A Dislocation of unspecified internal joint prosthesis, initial encounter: Secondary | ICD-10-CM | POA: Diagnosis not present

## 2013-10-16 DIAGNOSIS — E039 Hypothyroidism, unspecified: Secondary | ICD-10-CM | POA: Diagnosis present

## 2013-10-16 DIAGNOSIS — J449 Chronic obstructive pulmonary disease, unspecified: Secondary | ICD-10-CM | POA: Diagnosis present

## 2013-10-16 DIAGNOSIS — S73006A Unspecified dislocation of unspecified hip, initial encounter: Secondary | ICD-10-CM | POA: Diagnosis not present

## 2013-10-16 DIAGNOSIS — S71109A Unspecified open wound, unspecified thigh, initial encounter: Secondary | ICD-10-CM | POA: Diagnosis not present

## 2013-10-20 DIAGNOSIS — S73006A Unspecified dislocation of unspecified hip, initial encounter: Secondary | ICD-10-CM | POA: Diagnosis not present

## 2013-10-20 DIAGNOSIS — S71009A Unspecified open wound, unspecified hip, initial encounter: Secondary | ICD-10-CM | POA: Diagnosis not present

## 2013-10-20 DIAGNOSIS — N390 Urinary tract infection, site not specified: Secondary | ICD-10-CM | POA: Diagnosis not present

## 2013-10-20 DIAGNOSIS — R1312 Dysphagia, oropharyngeal phase: Secondary | ICD-10-CM | POA: Diagnosis not present

## 2013-10-20 DIAGNOSIS — D649 Anemia, unspecified: Secondary | ICD-10-CM | POA: Diagnosis not present

## 2013-10-20 DIAGNOSIS — L089 Local infection of the skin and subcutaneous tissue, unspecified: Secondary | ICD-10-CM | POA: Diagnosis not present

## 2013-10-20 DIAGNOSIS — I10 Essential (primary) hypertension: Secondary | ICD-10-CM | POA: Diagnosis not present

## 2013-10-20 DIAGNOSIS — M6281 Muscle weakness (generalized): Secondary | ICD-10-CM | POA: Diagnosis not present

## 2013-10-20 DIAGNOSIS — S71109A Unspecified open wound, unspecified thigh, initial encounter: Secondary | ICD-10-CM | POA: Diagnosis not present

## 2013-10-21 DIAGNOSIS — R609 Edema, unspecified: Secondary | ICD-10-CM | POA: Diagnosis not present

## 2013-10-21 DIAGNOSIS — M6281 Muscle weakness (generalized): Secondary | ICD-10-CM | POA: Diagnosis not present

## 2013-10-21 DIAGNOSIS — N390 Urinary tract infection, site not specified: Secondary | ICD-10-CM | POA: Diagnosis not present

## 2013-10-21 DIAGNOSIS — E46 Unspecified protein-calorie malnutrition: Secondary | ICD-10-CM | POA: Diagnosis not present

## 2013-10-21 DIAGNOSIS — L8992 Pressure ulcer of unspecified site, stage 2: Secondary | ICD-10-CM | POA: Diagnosis not present

## 2013-10-21 DIAGNOSIS — S71009A Unspecified open wound, unspecified hip, initial encounter: Secondary | ICD-10-CM | POA: Diagnosis not present

## 2013-10-21 DIAGNOSIS — L89109 Pressure ulcer of unspecified part of back, unspecified stage: Secondary | ICD-10-CM | POA: Diagnosis not present

## 2013-10-21 DIAGNOSIS — R1312 Dysphagia, oropharyngeal phase: Secondary | ICD-10-CM | POA: Diagnosis not present

## 2013-10-21 DIAGNOSIS — S73006A Unspecified dislocation of unspecified hip, initial encounter: Secondary | ICD-10-CM | POA: Diagnosis not present

## 2013-10-22 DIAGNOSIS — M6281 Muscle weakness (generalized): Secondary | ICD-10-CM | POA: Diagnosis not present

## 2013-10-22 DIAGNOSIS — S73006A Unspecified dislocation of unspecified hip, initial encounter: Secondary | ICD-10-CM | POA: Diagnosis not present

## 2013-10-22 DIAGNOSIS — R1312 Dysphagia, oropharyngeal phase: Secondary | ICD-10-CM | POA: Diagnosis not present

## 2013-10-23 DIAGNOSIS — M6281 Muscle weakness (generalized): Secondary | ICD-10-CM | POA: Diagnosis not present

## 2013-10-23 DIAGNOSIS — S73006A Unspecified dislocation of unspecified hip, initial encounter: Secondary | ICD-10-CM | POA: Diagnosis not present

## 2013-10-23 DIAGNOSIS — R1312 Dysphagia, oropharyngeal phase: Secondary | ICD-10-CM | POA: Diagnosis not present

## 2013-10-24 DIAGNOSIS — S73006A Unspecified dislocation of unspecified hip, initial encounter: Secondary | ICD-10-CM | POA: Diagnosis not present

## 2013-10-24 DIAGNOSIS — R1312 Dysphagia, oropharyngeal phase: Secondary | ICD-10-CM | POA: Diagnosis not present

## 2013-10-24 DIAGNOSIS — M6281 Muscle weakness (generalized): Secondary | ICD-10-CM | POA: Diagnosis not present

## 2013-10-26 DIAGNOSIS — R1312 Dysphagia, oropharyngeal phase: Secondary | ICD-10-CM | POA: Diagnosis not present

## 2013-10-26 DIAGNOSIS — S73006A Unspecified dislocation of unspecified hip, initial encounter: Secondary | ICD-10-CM | POA: Diagnosis not present

## 2013-10-26 DIAGNOSIS — M6281 Muscle weakness (generalized): Secondary | ICD-10-CM | POA: Diagnosis not present

## 2013-10-27 DIAGNOSIS — S73006A Unspecified dislocation of unspecified hip, initial encounter: Secondary | ICD-10-CM | POA: Diagnosis not present

## 2013-10-27 DIAGNOSIS — M6281 Muscle weakness (generalized): Secondary | ICD-10-CM | POA: Diagnosis not present

## 2013-10-27 DIAGNOSIS — R1312 Dysphagia, oropharyngeal phase: Secondary | ICD-10-CM | POA: Diagnosis not present

## 2013-10-28 DIAGNOSIS — L8992 Pressure ulcer of unspecified site, stage 2: Secondary | ICD-10-CM | POA: Diagnosis not present

## 2013-10-28 DIAGNOSIS — M6281 Muscle weakness (generalized): Secondary | ICD-10-CM | POA: Diagnosis not present

## 2013-10-28 DIAGNOSIS — L89309 Pressure ulcer of unspecified buttock, unspecified stage: Secondary | ICD-10-CM | POA: Diagnosis not present

## 2013-10-28 DIAGNOSIS — R1312 Dysphagia, oropharyngeal phase: Secondary | ICD-10-CM | POA: Diagnosis not present

## 2013-10-28 DIAGNOSIS — S73006A Unspecified dislocation of unspecified hip, initial encounter: Secondary | ICD-10-CM | POA: Diagnosis not present

## 2013-10-29 DIAGNOSIS — M6281 Muscle weakness (generalized): Secondary | ICD-10-CM | POA: Diagnosis not present

## 2013-10-29 DIAGNOSIS — S73006A Unspecified dislocation of unspecified hip, initial encounter: Secondary | ICD-10-CM | POA: Diagnosis not present

## 2013-10-29 DIAGNOSIS — R1312 Dysphagia, oropharyngeal phase: Secondary | ICD-10-CM | POA: Diagnosis not present

## 2013-10-30 DIAGNOSIS — M6281 Muscle weakness (generalized): Secondary | ICD-10-CM | POA: Diagnosis not present

## 2013-10-30 DIAGNOSIS — S73006A Unspecified dislocation of unspecified hip, initial encounter: Secondary | ICD-10-CM | POA: Diagnosis not present

## 2013-10-30 DIAGNOSIS — R1312 Dysphagia, oropharyngeal phase: Secondary | ICD-10-CM | POA: Diagnosis not present

## 2013-11-02 DIAGNOSIS — M6281 Muscle weakness (generalized): Secondary | ICD-10-CM | POA: Diagnosis not present

## 2013-11-02 DIAGNOSIS — R1312 Dysphagia, oropharyngeal phase: Secondary | ICD-10-CM | POA: Diagnosis not present

## 2013-11-02 DIAGNOSIS — S73006A Unspecified dislocation of unspecified hip, initial encounter: Secondary | ICD-10-CM | POA: Diagnosis not present

## 2013-11-03 DIAGNOSIS — R1312 Dysphagia, oropharyngeal phase: Secondary | ICD-10-CM | POA: Diagnosis not present

## 2013-11-03 DIAGNOSIS — M6281 Muscle weakness (generalized): Secondary | ICD-10-CM | POA: Diagnosis not present

## 2013-11-03 DIAGNOSIS — S73006A Unspecified dislocation of unspecified hip, initial encounter: Secondary | ICD-10-CM | POA: Diagnosis not present

## 2013-11-04 DIAGNOSIS — L89309 Pressure ulcer of unspecified buttock, unspecified stage: Secondary | ICD-10-CM | POA: Diagnosis not present

## 2013-11-04 DIAGNOSIS — S73006A Unspecified dislocation of unspecified hip, initial encounter: Secondary | ICD-10-CM | POA: Diagnosis not present

## 2013-11-04 DIAGNOSIS — M6281 Muscle weakness (generalized): Secondary | ICD-10-CM | POA: Diagnosis not present

## 2013-11-04 DIAGNOSIS — R1312 Dysphagia, oropharyngeal phase: Secondary | ICD-10-CM | POA: Diagnosis not present

## 2013-11-04 DIAGNOSIS — L8992 Pressure ulcer of unspecified site, stage 2: Secondary | ICD-10-CM | POA: Diagnosis not present

## 2013-11-05 DIAGNOSIS — S73006A Unspecified dislocation of unspecified hip, initial encounter: Secondary | ICD-10-CM | POA: Diagnosis not present

## 2013-11-05 DIAGNOSIS — M6281 Muscle weakness (generalized): Secondary | ICD-10-CM | POA: Diagnosis not present

## 2013-11-05 DIAGNOSIS — R1312 Dysphagia, oropharyngeal phase: Secondary | ICD-10-CM | POA: Diagnosis not present

## 2013-11-06 DIAGNOSIS — S73006A Unspecified dislocation of unspecified hip, initial encounter: Secondary | ICD-10-CM | POA: Diagnosis not present

## 2013-11-06 DIAGNOSIS — M6281 Muscle weakness (generalized): Secondary | ICD-10-CM | POA: Diagnosis not present

## 2013-11-06 DIAGNOSIS — R1312 Dysphagia, oropharyngeal phase: Secondary | ICD-10-CM | POA: Diagnosis not present

## 2013-11-09 DIAGNOSIS — R1312 Dysphagia, oropharyngeal phase: Secondary | ICD-10-CM | POA: Diagnosis not present

## 2013-11-09 DIAGNOSIS — M6281 Muscle weakness (generalized): Secondary | ICD-10-CM | POA: Diagnosis not present

## 2013-11-09 DIAGNOSIS — S73006A Unspecified dislocation of unspecified hip, initial encounter: Secondary | ICD-10-CM | POA: Diagnosis not present

## 2013-11-10 DIAGNOSIS — R1312 Dysphagia, oropharyngeal phase: Secondary | ICD-10-CM | POA: Diagnosis not present

## 2013-11-10 DIAGNOSIS — M6281 Muscle weakness (generalized): Secondary | ICD-10-CM | POA: Diagnosis not present

## 2013-11-10 DIAGNOSIS — S73006A Unspecified dislocation of unspecified hip, initial encounter: Secondary | ICD-10-CM | POA: Diagnosis not present

## 2013-11-11 DIAGNOSIS — M6281 Muscle weakness (generalized): Secondary | ICD-10-CM | POA: Diagnosis not present

## 2013-11-11 DIAGNOSIS — R1312 Dysphagia, oropharyngeal phase: Secondary | ICD-10-CM | POA: Diagnosis not present

## 2013-11-11 DIAGNOSIS — S73006A Unspecified dislocation of unspecified hip, initial encounter: Secondary | ICD-10-CM | POA: Diagnosis not present

## 2013-11-11 DIAGNOSIS — L8992 Pressure ulcer of unspecified site, stage 2: Secondary | ICD-10-CM | POA: Diagnosis not present

## 2013-11-11 DIAGNOSIS — L89309 Pressure ulcer of unspecified buttock, unspecified stage: Secondary | ICD-10-CM | POA: Diagnosis not present

## 2013-11-12 DIAGNOSIS — R1312 Dysphagia, oropharyngeal phase: Secondary | ICD-10-CM | POA: Diagnosis not present

## 2013-11-12 DIAGNOSIS — S73006A Unspecified dislocation of unspecified hip, initial encounter: Secondary | ICD-10-CM | POA: Diagnosis not present

## 2013-11-12 DIAGNOSIS — M6281 Muscle weakness (generalized): Secondary | ICD-10-CM | POA: Diagnosis not present

## 2013-11-13 DIAGNOSIS — M6281 Muscle weakness (generalized): Secondary | ICD-10-CM | POA: Diagnosis not present

## 2013-11-13 DIAGNOSIS — S73006A Unspecified dislocation of unspecified hip, initial encounter: Secondary | ICD-10-CM | POA: Diagnosis not present

## 2013-11-13 DIAGNOSIS — R1312 Dysphagia, oropharyngeal phase: Secondary | ICD-10-CM | POA: Diagnosis not present

## 2013-11-14 DIAGNOSIS — S73006A Unspecified dislocation of unspecified hip, initial encounter: Secondary | ICD-10-CM | POA: Diagnosis not present

## 2013-11-14 DIAGNOSIS — R1312 Dysphagia, oropharyngeal phase: Secondary | ICD-10-CM | POA: Diagnosis not present

## 2013-11-14 DIAGNOSIS — M6281 Muscle weakness (generalized): Secondary | ICD-10-CM | POA: Diagnosis not present

## 2013-11-16 DIAGNOSIS — S73006A Unspecified dislocation of unspecified hip, initial encounter: Secondary | ICD-10-CM | POA: Diagnosis not present

## 2013-11-16 DIAGNOSIS — M6281 Muscle weakness (generalized): Secondary | ICD-10-CM | POA: Diagnosis not present

## 2013-11-16 DIAGNOSIS — R1312 Dysphagia, oropharyngeal phase: Secondary | ICD-10-CM | POA: Diagnosis not present

## 2013-11-17 DIAGNOSIS — R1311 Dysphagia, oral phase: Secondary | ICD-10-CM | POA: Diagnosis not present

## 2013-11-17 DIAGNOSIS — M6281 Muscle weakness (generalized): Secondary | ICD-10-CM | POA: Diagnosis not present

## 2013-11-17 DIAGNOSIS — R262 Difficulty in walking, not elsewhere classified: Secondary | ICD-10-CM | POA: Diagnosis not present

## 2013-11-17 DIAGNOSIS — R1310 Dysphagia, unspecified: Secondary | ICD-10-CM | POA: Diagnosis not present

## 2013-11-17 DIAGNOSIS — R1312 Dysphagia, oropharyngeal phase: Secondary | ICD-10-CM | POA: Diagnosis not present

## 2013-11-17 DIAGNOSIS — S73006A Unspecified dislocation of unspecified hip, initial encounter: Secondary | ICD-10-CM | POA: Diagnosis not present

## 2013-11-18 DIAGNOSIS — R339 Retention of urine, unspecified: Secondary | ICD-10-CM | POA: Diagnosis not present

## 2013-11-19 DIAGNOSIS — N390 Urinary tract infection, site not specified: Secondary | ICD-10-CM | POA: Diagnosis not present

## 2013-12-02 DIAGNOSIS — R339 Retention of urine, unspecified: Secondary | ICD-10-CM | POA: Diagnosis not present

## 2013-12-02 DIAGNOSIS — S71009A Unspecified open wound, unspecified hip, initial encounter: Secondary | ICD-10-CM | POA: Diagnosis not present

## 2013-12-02 DIAGNOSIS — N390 Urinary tract infection, site not specified: Secondary | ICD-10-CM | POA: Diagnosis not present

## 2013-12-02 DIAGNOSIS — R1310 Dysphagia, unspecified: Secondary | ICD-10-CM | POA: Diagnosis not present

## 2013-12-03 DIAGNOSIS — Z79899 Other long term (current) drug therapy: Secondary | ICD-10-CM | POA: Diagnosis not present

## 2013-12-05 DIAGNOSIS — N390 Urinary tract infection, site not specified: Secondary | ICD-10-CM | POA: Diagnosis not present

## 2013-12-07 DIAGNOSIS — R1310 Dysphagia, unspecified: Secondary | ICD-10-CM | POA: Diagnosis not present

## 2013-12-07 DIAGNOSIS — N390 Urinary tract infection, site not specified: Secondary | ICD-10-CM | POA: Diagnosis not present

## 2013-12-07 DIAGNOSIS — E46 Unspecified protein-calorie malnutrition: Secondary | ICD-10-CM | POA: Diagnosis not present

## 2013-12-09 ENCOUNTER — Ambulatory Visit (INDEPENDENT_AMBULATORY_CARE_PROVIDER_SITE_OTHER): Payer: Medicare Other | Admitting: Gastroenterology

## 2013-12-09 ENCOUNTER — Encounter: Payer: Self-pay | Admitting: Gastroenterology

## 2013-12-09 VITALS — BP 103/68 | HR 86 | Temp 98.0°F

## 2013-12-09 DIAGNOSIS — K625 Hemorrhage of anus and rectum: Secondary | ICD-10-CM | POA: Diagnosis not present

## 2013-12-09 DIAGNOSIS — R1310 Dysphagia, unspecified: Secondary | ICD-10-CM | POA: Diagnosis not present

## 2013-12-09 DIAGNOSIS — E46 Unspecified protein-calorie malnutrition: Secondary | ICD-10-CM | POA: Diagnosis not present

## 2013-12-09 NOTE — Patient Instructions (Signed)
I need to talk with your brother, the nurses, and your speech therapist before proceeding with the feeding tube.  We will be in touch shortly!

## 2013-12-09 NOTE — Progress Notes (Deleted)
Wants to have feeding tube for supplemental nutrition. No abdominal pain. If eats something that tastes bad, will "puke it up". States he is eating a little better than he has been. States if he had ketchup on all the food, would eat it. Food doesn't have a taste to it. Denies dysphagia now since on pureed diet. Has been evaluated by speech therapy. No melena or hematochezia. Chronic left hip wound. Caretaker states they are making a dentist appt to discuss denture fitting. Doesn' tknow the year .   Review of Systems  Unable to perform ROS    Hip pain +short term memory loss Wheelchair bound. Involved with PT.

## 2013-12-11 DIAGNOSIS — R1310 Dysphagia, unspecified: Secondary | ICD-10-CM | POA: Insufficient documentation

## 2013-12-11 DIAGNOSIS — N390 Urinary tract infection, site not specified: Secondary | ICD-10-CM | POA: Diagnosis not present

## 2013-12-11 DIAGNOSIS — E46 Unspecified protein-calorie malnutrition: Secondary | ICD-10-CM | POA: Insufficient documentation

## 2013-12-11 NOTE — Assessment & Plan Note (Signed)
73 year old male, resident of [facility], presenting with historical dysphagia that has reportedly improved with modification of diet to pureed. Speech pathology reports requested; patient with poor oral intake that is multifactorial in the setting of poorly fitting dentures, lack of taste, lack of interest in food, dysphagia. PEG tube requested for supplemental nutrition due to poor overall intake. Patient is malnourished and has chronic left hip wound; would benefit from nutritional intervention. Power of attorney is son, who was not present today. Will need to contact Kandee Keen [phone] or [phone]) to discuss risks and benefits of PEG tube placement. I have also requested speech notes. Further recommendations to follow.

## 2013-12-11 NOTE — Assessment & Plan Note (Signed)
Isolated incidence last year in the setting of constipation. Unclear when last colonoscopy performed. Will need to discuss with son.

## 2013-12-11 NOTE — Progress Notes (Signed)
Primary Care Physician:  Maricela Curet, MD Primary Gastroenterologist:  Dr. Gala Romney  Chief Complaint  Patient presents with  . Weight Loss    pt on pureed diet and does not like it  . Dysphagia    HPI:   Alexander Hanna is a 73 year old male, patient of [facility], presenting today for evaluation secondary to weight loss, malnutrition, and need for possible feeding tube. Has left hip post-operative wound infection after left hip replacement. Wound vac.  Patient is a poor historian, confused but oriented to self. Outside labs with Hgb 11.7, normocytic anemia. Albumin 3.3 but prealbumin significantly low at 12.  Referring provider requesting feeding tube for supplemental nutrition. No abdominal pain. If eats something that tastes bad, will "puke it up". States he is eating a little better than he has been. States if he had ketchup on all the food, would eat it. Food doesn't have a taste to it. Previously endorsed dysphagia but improved on a pureed diet. Speech Therapy involved; I have requested notes. Caretaker states they are making a dentist appt to discuss denture fitting.    Spoke with nursing supervisor who states a PEG tube is requested due to poor oral intake. Weight loss of at least 70 lbs documented.   Appears he had an episode of rectal bleeding last year in the setting of constipation. Unknown if prior colonoscopy.   Past Medical History  Diagnosis Date  . Polio     as a child  . Environmental allergies   . Hypothyroidism   . Overactive bladder   . 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 Cholecystectomy    . Cataract extraction w/phaco  12/20/2011    Procedure: CATARACT EXTRACTION PHACO AND INTRAOCULAR LENS PLACEMENT (IOC);  Surgeon: Tonny Branch, MD;  Location: [facility];  Service: Ophthalmology;  Laterality: Left;  CDE: 14.80  . Cataract extraction w/phaco  01/14/2012    Procedure: CATARACT EXTRACTION  PHACO AND INTRAOCULAR LENS PLACEMENT (IOC);  Surgeon: Tonny Branch, MD;  Location: [facility];  Service: Ophthalmology;  Laterality: Right;  CDE 19.57  . Joint replacement      rt anterior hip  . Joint replacement      Left hip  . Wound debridement Left     Post hip replacement    Current Outpatient Prescriptions  Medication Sig Dispense Refill  . acetaminophen (TYLENOL) 325 MG tablet Take 650 mg by mouth 3 (three) times daily.      Marland Kitchen ascorbic acid (VITAMIN C) 500 MG tablet Take 500 mg by mouth daily.      Marland Kitchen aspirin EC 81 MG tablet Take 81 mg by mouth daily.      . cephALEXin (KEFLEX) 500 MG capsule Take 500 mg by mouth 2 (two) times daily.      Marland Kitchen docusate sodium (COLACE) 100 MG capsule Take 100 mg by mouth 2 (two) times daily.      Marland Kitchen dutasteride (AVODART) 0.5 MG capsule Take 0.5 mg by mouth daily.      Marland Kitchen ezetimibe (ZETIA) 10 MG tablet Take 10 mg by mouth daily.      . ferrous sulfate 325 (65 FE) MG tablet Take 325 mg by mouth 2 (two) times daily.       . fluticasone (FLONASE) 50 MCG/ACT nasal spray Place 2 sprays into the nose daily. Allergies      . guaiFENesin (MUCINEX) 600  MG 12 hr tablet Take by mouth 2 (two) times daily.      Marland Kitchen HYDROcodone-acetaminophen (NORCO/VICODIN) 5-325 MG per tablet Take 1 tablet by mouth every 6 (six) hours as needed for moderate pain. Takes 2 tablets every 6 hours      . levothyroxine (SYNTHROID, LEVOTHROID) 137 MCG tablet Take 125 mcg by mouth daily before breakfast.       . Multiple Vitamin (MULTIVITAMIN) tablet Take 1 tablet by mouth daily.      . niacin-simvastatin (SIMCOR) 500-20 MG 24 hr tablet Take 1 tablet by mouth at bedtime.      . NON FORMULARY Pro Stat Sugar Free Liquid  30 ml's bid for nutritional liquid      . NON FORMULARY RisaQuad Capsule  Probiotic   bid      . Nutritional Supplements (RESOURCE 2.0) LIQD Take 120 mLs by mouth 2 (two) times daily.      . pantoprazole (PROTONIX) 40 MG tablet Take 40 mg by mouth daily.      . polyethylene glycol  (MIRALAX / GLYCOLAX) packet Take 17 g by mouth daily.      . prochlorperazine (COMPAZINE) 5 MG tablet Take 5 mg by mouth every 6 (six) hours as needed for nausea or vomiting.      . tamsulosin (FLOMAX) 0.4 MG CAPS capsule Take 0.4 mg by mouth daily.       . traZODone (DESYREL) 50 MG tablet Take 1 tablet (50 mg total) by mouth at bedtime as needed for sleep (insomnia).  30 tablet  1   No current facility-administered medications for this visit.    Allergies as of 12/09/2013 - Review Complete 12/09/2013  Allergen Reaction Noted  . Amoxicillin Other (See Comments) 06/17/2013  . Avelox [moxifloxacin hcl in nacl]  12/09/2013  . Hydrocodone Other (See Comments) 06/17/2013  . Moxifloxacin Other (See Comments) 01/14/2012    Family History  Problem Relation Age of Onset  . Lung cancer Mother   . Heart attack Father   . Angina Brother   . Coronary artery disease Brother     History   Social History  . Marital Status: Single    Spouse Name: N/A    Number of Children: N/A  . Years of Education: N/A   Occupational History  . Not on file.   Social History Main Topics  . Smoking status: Never Smoker   . Smokeless tobacco: Never Used     Comment: Never smoked much  . Alcohol Use: No  . Drug Use: No  . Sexual Activity: Not Currently   Other Topics Concern  . Not on file   Social History Narrative  . No narrative on file    Review of Systems: Gen: see HPI CV: Denies chest pain, heart palpitations, peripheral edema, syncope.  Resp: Denies shortness of breath at rest or with exertion. Denies wheezing or cough.  GI: see HPI GU : Denies urinary burning, urinary frequency, urinary hesitancy MS: +hip pain, wheelchair bound.  Derm: Denies rash, itching, dry skin Psych: +short-term memory loss Heme: Denies bruising, bleeding, and enlarged lymph nodes.  Physical Exam: BP 103/68  Pulse 86  Temp(Src) 98 F (36.7 C) (Oral) General:   Alert and oriented to person and place.  Chronically-ill appearing.  Head:  Normocephalic and atraumatic. Eyes:  Without icterus, sclera clear and conjunctiva pink.  Ears:  Normal auditory acuity. Nose:  No deformity, discharge,  or lesions. Mouth:  No deformity or lesions, oral mucosa pink. dentures in place  Lungs:  Coarse, scattered rhonchi.  Heart:  S1, S2 present without murmurs appreciated.  Abdomen:  Limited exam with patient in wheelchair. +BS, mild TTP epigastric region.  Rectal:  Deferred  Msk:  Kyphosis  Extremities:  Without edema. Neurologic:  Alert and  oriented to person and place only Skin:  Fragile. Chronic wound left hip Psych:  Alert and cooperative. Normal mood and affect.

## 2013-12-11 NOTE — Assessment & Plan Note (Signed)
Multifactorial. Chronic left hip wound; needs improved nutritional intake for overall health and wound healing.

## 2013-12-14 ENCOUNTER — Telehealth: Payer: Self-pay | Admitting: Gastroenterology

## 2013-12-14 DIAGNOSIS — N390 Urinary tract infection, site not specified: Secondary | ICD-10-CM | POA: Diagnosis not present

## 2013-12-14 DIAGNOSIS — Z79899 Other long term (current) drug therapy: Secondary | ICD-10-CM | POA: Diagnosis not present

## 2013-12-14 NOTE — Telephone Encounter (Signed)
Spoke with brother, Chano Rinne. He is agreeable to PEG tube if indicated. He will sign consent.

## 2013-12-14 NOTE — Telephone Encounter (Signed)
PATIENT BROTHER RETURNED [facility], SMELSER CALL HIM BACK AT [phone]

## 2013-12-16 DIAGNOSIS — M79609 Pain in unspecified limb: Secondary | ICD-10-CM | POA: Diagnosis not present

## 2013-12-16 DIAGNOSIS — L03039 Cellulitis of unspecified toe: Secondary | ICD-10-CM | POA: Diagnosis not present

## 2013-12-16 DIAGNOSIS — S71009A Unspecified open wound, unspecified hip, initial encounter: Secondary | ICD-10-CM | POA: Diagnosis not present

## 2013-12-16 NOTE — Progress Notes (Signed)
I spoke with Alexander Hanna, speech therapist at [facility]. She states that the patient is actually eating well. His dentures do not fit, which is a problem. He is doing well on a pureed diet with coaching, but he does not "like" it as it looks unappealing. Alexander Hanna is attempting to provide better food choices. He coughs on liquids. She has arranged a modified barium swallow for further assessment. It is unclear if dealing with oropharyngeal dysphagia or esophageal dysphagia. I feel we need to wait for the modified barium swallow FIRST, have him return to clinic in about 2 weeks, then discuss if a PEG tube is needed. I do not believe he has ever had an upper endoscopy. IF he is able to improve his nutrition via oral route only, then a PEG tube is not indicated. As he is now taking in his diet appropriately per speech and his brother, then I would be hesitant to go straight to the PEG tube without further evaluation.    Please have him return in 2-3 weeks to see me. Please also contact his brother Alexander Hanna and let him know we are holding on the PEG tube until we reassess in 2 weeks.

## 2013-12-16 NOTE — Progress Notes (Signed)
I called and informed [name]. He would like to know when the appt is so that he is not left in the dark. Transferred the call to Manuela Schwartz to schedule.

## 2013-12-16 NOTE — Progress Notes (Signed)
Pt's brother is aware of OV on 10/21 at 29 with AS and [facility] [place]) is also aware and will arrange transportation.

## 2013-12-17 DIAGNOSIS — R1312 Dysphagia, oropharyngeal phase: Secondary | ICD-10-CM | POA: Diagnosis not present

## 2013-12-17 DIAGNOSIS — M6281 Muscle weakness (generalized): Secondary | ICD-10-CM | POA: Diagnosis not present

## 2013-12-17 DIAGNOSIS — S73006A Unspecified dislocation of unspecified hip, initial encounter: Secondary | ICD-10-CM | POA: Diagnosis not present

## 2013-12-17 DIAGNOSIS — R1311 Dysphagia, oral phase: Secondary | ICD-10-CM | POA: Diagnosis not present

## 2013-12-17 DIAGNOSIS — R262 Difficulty in walking, not elsewhere classified: Secondary | ICD-10-CM | POA: Diagnosis not present

## 2013-12-17 DIAGNOSIS — R1310 Dysphagia, unspecified: Secondary | ICD-10-CM | POA: Diagnosis not present

## 2013-12-17 NOTE — Progress Notes (Signed)
cc'ed to pcp °

## 2013-12-22 ENCOUNTER — Other Ambulatory Visit (HOSPITAL_COMMUNITY): Payer: Self-pay | Admitting: Internal Medicine

## 2013-12-22 DIAGNOSIS — I69391 Dysphagia following cerebral infarction: Secondary | ICD-10-CM

## 2013-12-22 DIAGNOSIS — R1310 Dysphagia, unspecified: Secondary | ICD-10-CM

## 2013-12-23 DIAGNOSIS — L98419 Non-pressure chronic ulcer of buttock with unspecified severity: Secondary | ICD-10-CM | POA: Diagnosis not present

## 2013-12-24 DIAGNOSIS — N390 Urinary tract infection, site not specified: Secondary | ICD-10-CM | POA: Diagnosis not present

## 2013-12-24 DIAGNOSIS — R109 Unspecified abdominal pain: Secondary | ICD-10-CM | POA: Diagnosis not present

## 2013-12-24 DIAGNOSIS — R110 Nausea: Secondary | ICD-10-CM | POA: Diagnosis not present

## 2013-12-24 DIAGNOSIS — R030 Elevated blood-pressure reading, without diagnosis of hypertension: Secondary | ICD-10-CM | POA: Diagnosis not present

## 2013-12-25 DIAGNOSIS — M6281 Muscle weakness (generalized): Secondary | ICD-10-CM | POA: Diagnosis not present

## 2013-12-25 DIAGNOSIS — R079 Chest pain, unspecified: Secondary | ICD-10-CM | POA: Diagnosis not present

## 2013-12-26 DIAGNOSIS — N390 Urinary tract infection, site not specified: Secondary | ICD-10-CM | POA: Diagnosis not present

## 2013-12-26 DIAGNOSIS — R319 Hematuria, unspecified: Secondary | ICD-10-CM | POA: Diagnosis not present

## 2013-12-26 DIAGNOSIS — M6281 Muscle weakness (generalized): Secondary | ICD-10-CM | POA: Diagnosis not present

## 2013-12-28 ENCOUNTER — Ambulatory Visit (HOSPITAL_COMMUNITY)
Admission: RE | Admit: 2013-12-28 | Discharge: 2013-12-28 | Disposition: A | Discharge status: Home / Self Care | Payer: Medicare Other | Source: Ambulatory Visit | Attending: Internal Medicine | Admitting: Internal Medicine

## 2013-12-28 ENCOUNTER — Other Ambulatory Visit (HOSPITAL_COMMUNITY): Payer: Self-pay | Admitting: Internal Medicine

## 2013-12-28 DIAGNOSIS — R1310 Dysphagia, unspecified: Secondary | ICD-10-CM | POA: Diagnosis not present

## 2013-12-28 DIAGNOSIS — R1313 Dysphagia, pharyngeal phase: Secondary | ICD-10-CM | POA: Insufficient documentation

## 2013-12-28 DIAGNOSIS — R1311 Dysphagia, oral phase: Secondary | ICD-10-CM | POA: Diagnosis not present

## 2013-12-28 DIAGNOSIS — R109 Unspecified abdominal pain: Secondary | ICD-10-CM | POA: Diagnosis not present

## 2013-12-28 DIAGNOSIS — Z5189 Encounter for other specified aftercare: Secondary | ICD-10-CM | POA: Diagnosis not present

## 2013-12-28 DIAGNOSIS — N390 Urinary tract infection, site not specified: Secondary | ICD-10-CM | POA: Diagnosis not present

## 2013-12-28 DIAGNOSIS — I639 Cerebral infarction, unspecified: Secondary | ICD-10-CM | POA: Diagnosis not present

## 2013-12-28 DIAGNOSIS — R110 Nausea: Secondary | ICD-10-CM | POA: Diagnosis not present

## 2013-12-28 DIAGNOSIS — R030 Elevated blood-pressure reading, without diagnosis of hypertension: Secondary | ICD-10-CM | POA: Diagnosis not present

## 2013-12-28 DIAGNOSIS — T17300A Unspecified foreign body in larynx causing asphyxiation, initial encounter: Secondary | ICD-10-CM | POA: Diagnosis not present

## 2013-12-28 DIAGNOSIS — I69391 Dysphagia following cerebral infarction: Secondary | ICD-10-CM

## 2013-12-28 NOTE — Procedures (Signed)
Objective Swallowing Evaluation: Modified Barium Swallowing Study   Patient Details  Name: Alexander Hanna MRN: [id number] Date of Birth: June 18, 1940  Today's Date: 12/28/2013 Time: 1:12 PM  - 1:45 PM    Past Medical History:  Past Medical History  Diagnosis Date  . Polio     as a child  . Environmental allergies   . Hypothyroidism   . Overactive bladder   . 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 Cholecystectomy    . Cataract extraction w/phaco  12/20/2011    Procedure: CATARACT EXTRACTION PHACO AND INTRAOCULAR LENS PLACEMENT (IOC);  Surgeon: Tonny Branch, MD;  Location: [facility];  Service: Ophthalmology;  Laterality: Left;  CDE: 14.80  . Cataract extraction w/phaco  01/14/2012    Procedure: CATARACT EXTRACTION PHACO AND INTRAOCULAR LENS PLACEMENT (IOC);  Surgeon: Tonny Branch, MD;  Location: [facility];  Service: Ophthalmology;  Laterality: Right;  CDE 19.57  . Joint replacement      rt anterior hip  . Joint replacement      Left hip  . Wound debridement Left     Post hip replacement   HPI:  Alexander Hanna is a 73 year old man who is a resident at [facility] who was referred for MBSS due to weight loss, lack of appetite, and dysphagia. 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 He has a history of significant gait impairment and has a history of lumbar spinal stenosis and a right hip arthroplasty performed within the last 2 years he has had progressive weakness of both lower extremities stumbling and falling more so over the last 4 months. He fell last fall then at an assisted-living facility using a walker when he fell in February of this year and had a left hip fracture femoral neck underwent left hip hemiarthroplasty 11 February complicated by postoperative wound infection underwent incision and drainage debridement with his hip fracture  repair and incision and drainage performed in [place] at [facility] has been following him from a GI standpoint and is considering PEG palcement for supplemental nutrition. He was recently changed to a puree diet and has been doing fairly well on that per report from Pt's brother, Kelvon Denkins. His brother reports that pt is allowed to have thin water, tea, and coffee, but has all other liquids thickened to nectar-like. He denies history of pneumonia. Pt is alert and cooperative.     Recommendation/Prognosis  Clinical Impression:   Dysphagia Diagnosis: Mild oral phase dysphagia;Moderate pharyngeal phase dysphagia  Clinical impression: [name] presents with mild oral phase and moderate pharyngeal phase dysphagia characterized by impaired mastication and bolus manipulation of solids (prolonged but inefficient oral prep), delay in swallow initiation with all liquid consistencies, premature spillage to the pyriforms (and laryngeal vestibule on occasion), decreased hyolaryngeal excursion, tongue base retraction and epiglottic deflection resulting in silent (and delayed but ineffective cough) aspiration of thins before and during the swallow, aspiration of nectars when attempting to swallow pill with sequential swallows, and with honey-thick before the swallow. Moderate/severe vallecular residue noted post swallow with regular textures. Nectar wash presented after were only mildly effective in reducing vallecular residue. Residuals lessened over the course of the study with additional liquid wash.  Pt is at significant risk for aspiration unfortunately with all textures and consistencies, however risks can be minimized by diet  modification (puree or fine chop with nectar-thick liquids) and use of compensatory strategies (small bites/sips, effortful swallow, swallow 2x for each bite/sip, and periodic cough). It should be noted that aspiration was both silent and audible, however cough was  ineffective at expelling aspirate from trachea - more effective in removing any penetration. The study was reviewed with pt's brother who states that pt only drinks some liquids with thickener at this time. Recommend thickening all liquids to nectar given objective testing today, however if pt is not showing clinical signs of aspiration (ie. No history of PNA), perhaps he can continue with thin water and small sips coffee. F/U per treating SLP at [facility]. Please call with questions.    Swallow Evaluation Recommendations:  Diet Recommendations: Dysphagia 2 (Fine chop);Nectar-thick liquid Liquid Administration via: Cup Medication Administration: Whole meds with puree Supervision: Patient able to self feed;Full supervision/cueing for compensatory strategies Compensations: Small sips/bites;Multiple dry swallows after each bite/sip;Follow solids with liquid;Effortful swallow Postural Changes and/or Swallow Maneuvers: Out of bed for meals;Seated upright 90 degrees;Upright 30-60 min after meal Oral Care Recommendations: Oral care BID Other Recommendations: Order thickener from pharmacy;Clarify dietary restrictions Follow up Recommendations: Skilled Nursing facility    Prognosis:  Prognosis for Safe Diet Advancement: Fair   Individuals Consulted: Consulted and Agree with Results and Recommendations: Family member/caregiver;Patient Family Member Consulted: Pt's brother, Hawk Taufa Report Sent to : Facility (Comment) ([facility])    SLP Assessment/Plan  Plan:   F/U at [facility]   General: Date of Onset: 12/18/13 Type of Study: Modified Barium Swallowing Study Reason for Referral: Objectively evaluate swallowing function Previous Swallow Assessment: None on record Diet Prior to this Study: Dysphagia 1 (puree);Nectar-thick liquids;Thin liquids;Other (Comment) (brother reports NTL juices, thin water, tea, and coffee) Temperature Spikes Noted: No Respiratory Status: Room air History of Recent Intubation:  No Behavior/Cognition: Cooperative;Pleasant mood;Alert Oral Cavity - Dentition: Dentures, top;Dentures, bottom (loose fitting) Oral Motor / Sensory Function: Within functional limits Self-Feeding Abilities: Able to feed self Patient Positioning: Upright in chair Baseline Vocal Quality: Clear Volitional Cough: Strong Volitional Swallow: Able to elicit Anatomy: Within functional limits Pharyngeal Secretions: Not observed secondary MBS   Reason for Referral:   Objectively evaluate swallowing function    Oral Phase: Oral Preparation/Oral Phase Oral Phase: Impaired Oral - Solids Oral - Regular: Impaired mastication;Piecemeal swallowing;Delayed oral transit;Lingual/palatal residue   Pharyngeal Phase:  Pharyngeal Phase Pharyngeal Phase: Impaired Pharyngeal - Honey Pharyngeal - Honey Cup: Delayed swallow initiation;Premature spillage to valleculae;Penetration/Aspiration before swallow;Moderate aspiration Penetration/Aspiration details (honey cup): Material enters airway, passes BELOW cords without attempt by patient to eject out (silent aspiration) Pharyngeal - Nectar Pharyngeal - Nectar Cup: Delayed swallow initiation;Premature spillage to valleculae;Premature spillage to pyriform sinuses;Reduced epiglottic inversion;Reduced pharyngeal peristalsis;Reduced airway/laryngeal closure;Penetration/Aspiration during swallow;Moderate aspiration;Pharyngeal residue - valleculae;Lateral channel residue Penetration/Aspiration details (nectar cup): Material does not enter airway;Material enters airway, remains ABOVE vocal cords then ejected out;Material enters airway, passes BELOW cords and not ejected out despite cough attempt by patient (aspiration of NTL when taking pill and with sequential swallow head back) Pharyngeal - Thin Pharyngeal - Thin Cup: Delayed swallow initiation;Premature spillage to valleculae;Premature spillage to pyriform sinuses;Reduced airway/laryngeal closure;Moderate  aspiration;Penetration/Aspiration during swallow;Pharyngeal residue - valleculae;Lateral channel residue;Pharyngeal residue - pyriform sinuses;Reduced anterior laryngeal mobility;Reduced laryngeal elevation Penetration/Aspiration details (thin cup): Material enters airway, passes BELOW cords without attempt by patient to eject out (silent aspiration);Material does not enter airway;Material enters airway, remains ABOVE vocal cords then ejected out;Material enters airway, passes BELOW cords and not ejected out despite cough  attempt by patient Pharyngeal - Solids Pharyngeal - Puree: Delayed swallow initiation;Premature spillage to valleculae;Reduced epiglottic inversion;Reduced tongue base retraction;Pharyngeal residue - valleculae Pharyngeal - Regular: Reduced epiglottic inversion;Reduced tongue base retraction;Pharyngeal residue - valleculae Pharyngeal - Pill:  (aspirated NTL when taking pill)   Cervical Esophageal Phase  Cervical Esophageal Phase Cervical Esophageal Phase: [facility]   GN  Functional Assessment Tool Used: MBSS Functional Limitations: Swallowing Swallow Current Status [id number]): At least 40 percent but less than 60 percent impaired, limited or restricted Swallow Goal Status [phone]): At least 40 percent but less than 60 percent impaired, limited or restricted Swallow Discharge Status [phone]): At least 40 percent but less than 60 percent impaired, limited or restricted      Thank you,  Genene Churn, [place]  [employer] 12/28/2013, 5:24 PM

## 2013-12-30 DIAGNOSIS — L98419 Non-pressure chronic ulcer of buttock with unspecified severity: Secondary | ICD-10-CM | POA: Diagnosis not present

## 2014-01-04 ENCOUNTER — Encounter (HOSPITAL_COMMUNITY): Payer: Medicare Other | Admitting: Speech Pathology

## 2014-01-06 ENCOUNTER — Encounter: Payer: Self-pay | Admitting: Gastroenterology

## 2014-01-06 ENCOUNTER — Ambulatory Visit (INDEPENDENT_AMBULATORY_CARE_PROVIDER_SITE_OTHER): Payer: Medicare Other | Admitting: Gastroenterology

## 2014-01-06 ENCOUNTER — Other Ambulatory Visit: Payer: Self-pay

## 2014-01-06 VITALS — BP 101/62 | HR 76 | Temp 97.1°F

## 2014-01-06 DIAGNOSIS — E46 Unspecified protein-calorie malnutrition: Secondary | ICD-10-CM

## 2014-01-06 NOTE — Patient Instructions (Signed)
We have scheduled you for a PEG tube placement endoscopically.   Your nutrition needs will be assessed at the facility by the nutritionist.

## 2014-01-07 ENCOUNTER — Encounter (HOSPITAL_COMMUNITY): Payer: Self-pay | Admitting: Pharmacy Technician

## 2014-01-12 NOTE — Progress Notes (Signed)
Referring Provider: Maricela Curet, MD Primary Care Physician:  Maricela Curet, MD Primary GI: Dr. Gala Romney   Chief Complaint  Patient presents with  . peg placement    HPI:   Alexander Hanna is a 73 year old male presenting today with his brother, Eddie Ilacqua, who is power of attorney. He is a resident of [facility], mentally challenged, with weight loss, lack of appetite, and reported dysphagia. He was seen by speech therapy on 10/12 and underwent an MBSS. He is here to discuss a PEG tube for supplemental nutrition. MBSS revealed mild oral phase and moderate pharyngeal phase dysphagia, at significant risk for aspiration with all textures and consistencies. Silent and audible aspiration noted. Recommended dysphagia 2 diet with nectar-thick liquid.   He is malnourished, with last prealbumin low at 12. Feeding tube has been requested by primary provider and family to assist in supplemental feeding at night. Documented weight loss of at least 70 lbs. Has had regurgitating chronically over the past year. Ate chili the other day and noted abdominal pain. No prior EGD to families' knowledge. Last colonoscopy by Dr. Laural Golden in the remote past. Isolated incidence of rectal bleeding last year. Patient and family declining colonoscopy.   Past Medical History  Diagnosis Date  . Polio     as a child  . Environmental allergies   . Hypothyroidism   . Overactive bladder   . 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 Cholecystectomy    . Cataract extraction w/phaco  12/20/2011    Procedure: CATARACT EXTRACTION PHACO AND INTRAOCULAR LENS PLACEMENT (IOC);  Surgeon: Tonny Branch, MD;  Location: [facility];  Service: Ophthalmology;  Laterality: Left;  CDE: 14.80  . Cataract extraction w/phaco  01/14/2012    Procedure: CATARACT EXTRACTION PHACO AND INTRAOCULAR LENS PLACEMENT (IOC);  Surgeon: Tonny Branch, MD;  Location: [facility];  Service:  Ophthalmology;  Laterality: Right;  CDE 19.57  . Joint replacement      rt anterior hip  . Joint replacement      Left hip  . Wound debridement Left     Post hip replacement    Current Outpatient Prescriptions  Medication Sig Dispense Refill  . acetaminophen (TYLENOL) 325 MG tablet Take 650 mg by mouth 3 (three) times daily.      Marland Kitchen acidophilus (RISAQUAD) CAPS capsule Take 1 capsule by mouth 2 (two) times daily.      . Amino Acids-Protein Hydrolys (FEEDING SUPPLEMENT, PRO-STAT SUGAR FREE 64,) LIQD Take 30 mLs by mouth 2 (two) times daily.      Marland Kitchen ascorbic acid (VITAMIN C) 500 MG tablet Take 500 mg by mouth daily.      Marland Kitchen aspirin EC 81 MG tablet Take 81 mg by mouth daily.      Marland Kitchen docusate sodium (COLACE) 100 MG capsule Take 100 mg by mouth 2 (two) times daily.      Marland Kitchen dutasteride (AVODART) 0.5 MG capsule Take 0.5 mg by mouth daily.      Marland Kitchen ezetimibe (ZETIA) 10 MG tablet Take 10 mg by mouth daily.      . ferrous sulfate 325 (65 FE) MG tablet Take 325 mg by mouth 2 (two) times daily.       . fluticasone (FLONASE) 50 MCG/ACT nasal spray Place 2 sprays into the nose daily. Allergies      . guaiFENesin (MUCINEX) 600 MG 12 hr tablet Take  by mouth 2 (two) times daily.      Marland Kitchen HYDROcodone-acetaminophen (NORCO/VICODIN) 5-325 MG per tablet Take 1 tablet by mouth every 6 (six) hours as needed for moderate pain. Takes 2 tablets every 6 hours      . levothyroxine (SYNTHROID, LEVOTHROID) 137 MCG tablet Take 125 mcg by mouth daily before breakfast.       . Multiple Vitamin (MULTIVITAMIN) tablet Take 1 tablet by mouth daily.      . niacin-simvastatin (SIMCOR) 500-20 MG 24 hr tablet Take 1 tablet by mouth at bedtime.      . Nutritional Supplements (RESOURCE 2.0) LIQD Take 120 mLs by mouth 2 (two) times daily.      . pantoprazole (PROTONIX) 40 MG tablet Take 40 mg by mouth daily.      . polyethylene glycol (MIRALAX / GLYCOLAX) packet Take 17 g by mouth daily.      . tamsulosin (FLOMAX) 0.4 MG CAPS capsule Take  0.4 mg by mouth daily.       . traZODone (DESYREL) 50 MG tablet Take 1 tablet (50 mg total) by mouth at bedtime as needed for sleep (insomnia).  30 tablet  1   No current facility-administered medications for this visit.    Allergies as of 01/06/2014 - Review Complete 01/06/2014  Allergen Reaction Noted  . Amoxicillin Other (See Comments) 06/17/2013  . Avelox [moxifloxacin hcl in nacl]  12/09/2013  . Hydrocodone Other (See Comments) 06/17/2013  . Moxifloxacin Other (See Comments) 01/14/2012    Family History  Problem Relation Age of Onset  . Lung cancer Mother   . Heart attack Father   . Angina Brother   . Coronary artery disease Brother     History   Social History  . Marital Status: Single    Spouse Name: N/A    Number of Children: N/A  . Years of Education: N/A   Social History Main Topics  . Smoking status: Never Smoker   . Smokeless tobacco: Never Used     Comment: Never smoked much  . Alcohol Use: No  . Drug Use: No  . Sexual Activity: Not Currently   Other Topics Concern  . None   Social History Narrative  . None    Review of Systems: As mentioned in HPI.    Physical Exam: BP 101/62  Pulse 76  Temp(Src) 97.1 F (36.2 C) (Oral) General:   Alert and oriented.chronically-ill appearing. Head:  Normocephalic and atraumatic. Eyes:  Conjuctiva clear without scleral icterus. Ears: HOH Mouth:  Oral mucosa pink and moist. Dentures in place.  Heart:  S1, S2 present without murmurs Abdomen:  Limited with patient in wheelchair. +BS, soft, non-tender and non-distended. Msk:  Kyphosis. Muscles wasting lower extremities.  Extremities:  Without edema. Neurologic:  Alert and  oriented to person and place Skin:  Fragile, thin.  Psych:  Alert and cooperative. Normal mood and affect.

## 2014-01-12 NOTE — Assessment & Plan Note (Addendum)
73 year old mentally-challenged pleasant male, residing at [facility], presenting with oropharyngeal phase dysphagia, at significant risk for aspiration with all textures and consistencies as evidenced by recent speech evaluation. Now with documented weight loss, regurgitation, and difficulty maintaining adequate nutrition via solely the oral route. Family and patient both desire PEG tube placement for supplemental nutrition. Family and dietician at [facility] have discussed night-time feeds. To families knowledge, no prior EGD performed.   Will proceed with PEG placement endoscopically with Dr. Gala Romney in near future. Discussed in details the risks and benefits, also including the risk of aspiration is NOT decreased by placing a G tube. Both family and patient understand. As of note, last colonoscopy requested from medical records. No further evidence of rectal bleeding, which was noted last year during an isolated incidence. Patient would likely not tolerate the prep, specifically with his significant decreased oral intake.   DUE TO PENICILLIN ALLERGY, will not use Ancef. Although brother states this is not a true allergy, I would rather err on the side of caution. Will utilize Mefoxin 2 g IV on call (2nd generation cephalosporin). Discussed with Sharyn Lull in pharmacy who states risk of cross-reaction low. Verbal order given to Prudence Davidson, RN.

## 2014-01-13 NOTE — Progress Notes (Signed)
cc'ed to pcp °

## 2014-01-14 ENCOUNTER — Ambulatory Visit (HOSPITAL_COMMUNITY)
Admission: RE | Admit: 2014-01-14 | Discharge: 2014-01-14 | Disposition: A | Discharge status: Home / Self Care | Payer: Medicare Other | Source: Ambulatory Visit | Attending: Internal Medicine | Admitting: Internal Medicine

## 2014-01-14 ENCOUNTER — Encounter (HOSPITAL_COMMUNITY): Payer: Self-pay

## 2014-01-14 ENCOUNTER — Encounter (HOSPITAL_COMMUNITY)
Admission: RE | Disposition: A | Discharge status: Home / Self Care | Payer: Self-pay | Source: Ambulatory Visit | Attending: Internal Medicine

## 2014-01-14 DIAGNOSIS — K449 Diaphragmatic hernia without obstruction or gangrene: Secondary | ICD-10-CM | POA: Insufficient documentation

## 2014-01-14 DIAGNOSIS — Z7982 Long term (current) use of aspirin: Secondary | ICD-10-CM | POA: Diagnosis not present

## 2014-01-14 DIAGNOSIS — E46 Unspecified protein-calorie malnutrition: Secondary | ICD-10-CM | POA: Diagnosis not present

## 2014-01-14 DIAGNOSIS — R1312 Dysphagia, oropharyngeal phase: Secondary | ICD-10-CM | POA: Insufficient documentation

## 2014-01-14 DIAGNOSIS — Z7951 Long term (current) use of inhaled steroids: Secondary | ICD-10-CM | POA: Diagnosis not present

## 2014-01-14 DIAGNOSIS — R1314 Dysphagia, pharyngoesophageal phase: Secondary | ICD-10-CM

## 2014-01-14 DIAGNOSIS — E039 Hypothyroidism, unspecified: Secondary | ICD-10-CM | POA: Insufficient documentation

## 2014-01-14 DIAGNOSIS — F79 Unspecified intellectual disabilities: Secondary | ICD-10-CM | POA: Diagnosis not present

## 2014-01-14 DIAGNOSIS — Z79899 Other long term (current) drug therapy: Secondary | ICD-10-CM | POA: Diagnosis not present

## 2014-01-14 DIAGNOSIS — K222 Esophageal obstruction: Secondary | ICD-10-CM | POA: Insufficient documentation

## 2014-01-14 HISTORY — PX: ESOPHAGOGASTRODUODENOSCOPY: SHX5428

## 2014-01-14 HISTORY — PX: PEG PLACEMENT: SHX5437

## 2014-01-14 SURGERY — EGD (ESOPHAGOGASTRODUODENOSCOPY)
Anesthesia: Moderate Sedation

## 2014-01-14 MED ORDER — LIDOCAINE VISCOUS 2 % MT SOLN
OROMUCOSAL | Status: AC
  Filled 2014-01-14: qty 15

## 2014-01-14 MED ORDER — SIMETHICONE 40 MG/0.6ML PO SUSP
ORAL | Status: DC | PRN
  Administered 2014-01-14: 13:00:00

## 2014-01-14 MED ORDER — SODIUM CHLORIDE 0.9 % IV SOLN
INTRAVENOUS | Status: DC
  Administered 2014-01-14: 12:00:00 via INTRAVENOUS

## 2014-01-14 MED ORDER — ONDANSETRON HCL 4 MG/2ML IJ SOLN
INTRAMUSCULAR | Status: AC
  Filled 2014-01-14: qty 2

## 2014-01-14 MED ORDER — ONDANSETRON HCL 4 MG/2ML IJ SOLN
INTRAMUSCULAR | Status: DC | PRN
  Administered 2014-01-14: 4 mg via INTRAVENOUS

## 2014-01-14 MED ORDER — MIDAZOLAM HCL 5 MG/5ML IJ SOLN
INTRAMUSCULAR | Status: DC | PRN
  Administered 2014-01-14: 0.5 mg via INTRAVENOUS
  Administered 2014-01-14 (×2): 1 mg via INTRAVENOUS

## 2014-01-14 MED ORDER — LIDOCAINE 1 % OPTIME INJ - NO CHARGE
INTRAMUSCULAR | Status: DC | PRN
  Administered 2014-01-14: 4 mL via INTRADERMAL

## 2014-01-14 MED ORDER — DEXTROSE 5 % IV SOLN
INTRAVENOUS | Status: AC
  Filled 2014-01-14: qty 2

## 2014-01-14 MED ORDER — MIDAZOLAM HCL 5 MG/5ML IJ SOLN
INTRAMUSCULAR | Status: AC
  Filled 2014-01-14: qty 10

## 2014-01-14 MED ORDER — MEPERIDINE HCL 100 MG/ML IJ SOLN
INTRAMUSCULAR | Status: DC | PRN
  Administered 2014-01-14: 25 mg via INTRAVENOUS
  Administered 2014-01-14: 12.5 mg via INTRAVENOUS

## 2014-01-14 MED ORDER — MEPERIDINE HCL 100 MG/ML IJ SOLN
INTRAMUSCULAR | Status: AC
  Filled 2014-01-14: qty 2

## 2014-01-14 MED ORDER — CEFOXITIN SODIUM 2 G IV SOLR
2.0000 g | Freq: Once | INTRAVENOUS | Status: AC
  Administered 2014-01-14: 2 g via INTRAVENOUS

## 2014-01-14 MED ORDER — LIDOCAINE VISCOUS 2 % MT SOLN
OROMUCOSAL | Status: DC | PRN
  Administered 2014-01-14: 4 mL via OROMUCOSAL

## 2014-01-14 NOTE — H&P (View-Only) (Signed)
I spoke with Alexander Hanna, speech therapist at [facility]. She states that the patient is actually eating well. His dentures do not fit, which is a problem. He is doing well on a pureed diet with coaching, but he does not "like" it as it looks unappealing. Alexander Hanna is attempting to provide better food choices. He coughs on liquids. She has arranged a modified barium swallow for further assessment. It is unclear if dealing with oropharyngeal dysphagia or esophageal dysphagia. I feel we need to wait for the modified barium swallow FIRST, have him return to clinic in about 2 weeks, then discuss if a PEG tube is needed. I do not believe he has ever had an upper endoscopy. IF he is able to improve his nutrition via oral route only, then a PEG tube is not indicated. As he is now taking in his diet appropriately per speech and his brother, then I would be hesitant to go straight to the PEG tube without further evaluation.    Please have him return in 2-3 weeks to see me. Please also contact his brother Alexander Hanna and let him know we are holding on the PEG tube until we reassess in 2 weeks.

## 2014-01-14 NOTE — Discharge Instructions (Signed)
EGD Discharge instructions Please read the instructions outlined below and refer to this sheet in the next few weeks. These discharge instructions provide you with general information on caring for yourself after you leave the hospital. Your doctor may also give you specific instructions. While your treatment has been planned according to the most current medical practices available, unavoidable complications occasionally occur. If you have any problems or questions after discharge, please call your doctor. ACTIVITY  You may resume your regular activity but move at a slower pace for the next 24 hours.   Take frequent rest periods for the next 24 hours.   Walking will help expel (get rid of) the air and reduce the bloated feeling in your abdomen.   No driving for 24 hours (because of the anesthesia (medicine) used during the test).   You may shower.   Do not sign any important legal documents or operate any machinery for 24 hours (because of the anesthesia used during the test).  NUTRITION  Drink plenty of fluids.   You may resume your normal diet.   Begin with a light meal and progress to your normal diet.   Avoid alcoholic beverages for 24 hours or as instructed by your caregiver.  MEDICATIONS  You may resume your normal medications unless your caregiver tells you otherwise.  WHAT YOU CAN EXPECT TODAY  You may experience abdominal discomfort such as a feeling of fullness or gas pains.  FOLLOW-UP  Your doctor will discuss the results of your test with you.  SEEK IMMEDIATE MEDICAL ATTENTION IF ANY OF THE FOLLOWING OCCUR:  Excessive nausea (feeling sick to your stomach) and/or vomiting.   Severe abdominal pain and distention (swelling).   Trouble swallowing.   Temperature over 101 F (37.8 C).   Rectal bleeding or vomiting of blood.    Keep abdominal binder in place for 2 weeks then when necessary thereafter  Loosen external bumper by 1 cm (to 4.5 cm) on October  31  May begin water via the PEG tube at 40 mL an hour 4 hours; if doing okay in 4 hours, may begin tube feedings of choice. May use PEG tube for medications immediately.

## 2014-01-14 NOTE — Interval H&P Note (Signed)
History and Physical Interval Note:  01/14/2014 1:12 PM  Alexander Hanna  has presented today for surgery, with the diagnosis of malnutrition  The various methods of treatment have been discussed with the patient and family. After consideration of risks, benefits and other options for treatment, the patient has consented to  Procedure(s) with comments: ESOPHAGOGASTRODUODENOSCOPY (EGD) (N/A) - 100 PERCUTANEOUS ENDOSCOPIC GASTROSTOMY (PEG) PLACEMENT (N/A) as a surgical intervention .  The patient's history has been reviewed, patient examined, no change in status, stable for surgery.  I have reviewed the patient's chart and labs.  Questions were answered to the patient's satisfaction.     Alexander Hanna  No change. I discussed the approach of EGD with PEG placement with the patient and his brother, Alexander Hanna. I specifically discussed the risk of respiratory issues related to the procedure, bleeding, infection. I also discussed the fact that the PEG placement will not change his overall health condition and will not prevent aspiration. His questions were answered he is agreeable.The risks, benefits, limitations, alternatives and imponderables have been reviewed with the patient. Potential for esophageal dilation, biopsy, etc. have also been reviewed.  Questions have been answered. All parties agreeable.

## 2014-01-14 NOTE — Op Note (Signed)
[facility] [address] [place], [address]   ENDOSCOPY PROCEDURE REPORT  PATIENT: Alexander, Hanna  MR#: [id number] BIRTHDATE: Jun 10, 1940 , 30  yrs. old GENDER: male ENDOSCOPIST: R.  Garfield Cornea, MD FACP FACG REFERRED BY:  Conni Slipper, M.D. PROCEDURE DATE:  01/18/14 PROCEDURE:  EGD w/ percutaneous gastrostomy tube placement INDICATIONS:  oropharyngeal dysphagia. MEDICATIONS: Versed 2.5 mg IV and Demerol 37.5 mg IV in divided doses.  1% plain Xylocaine 4 mL.  Xylocaine gel orally.  Cefoxitin 2 g IV.  Zofran 4 mg IV. ASA CLASS:      Class III  CONSENT: The risks, benefits, limitations, alternatives and imponderables have been discussed.  The potential for biopsy, esophogeal dilation, etc. have also been reviewed.  Questions have been answered.  All parties agreeable.  Please see the history and physical in the medical record for more information.  DESCRIPTION OF PROCEDURE: After the risks benefits and alternatives of the procedure were thoroughly explained, informed consent was obtained.  The EG-[id number] ([id number]) endoscope was introduced through the mouth and advanced to the second portion of the duodenum , limited by Without limitations. The instrument was slowly withdrawn as the mucosa was fully examined.    Noncritical Schatzki's ring; otherwise, normal esophagus.  Stomach empty.  Small hiatal hernia.  Normal gastric mucosa.  Patent pylorus.  Normal first and second portion of the duodenum  Scope was pulled back into the stomach where it was fully inflated. A suitable site along the medial left upper quadrant was selected for PEG placement utilizing a combination of transillumination and external palpation.  There was excellent transillumination of light at the site selected for PEG placement.  Overlying skin was prepped in sterile fashion with Betadine.  3 mL 1% plain Xylocaine used for local infiltrative anesthesia.  The Microvasive PEG tube  kit trochar was advanced through the abdominal wall into the gastric cavity under endoscopic control.  The guidewire was advanced through the needle grasped with a snare through the scope and pulled out of the patient's mouth.  A new 20 French Microvasive PEG tube was advanced over the guidewire through the mouth and the external bumper was snugged appropriately 3.5 cm mark.  A look back revealed the internal bumper to be in good position without apparent complication seen.  Retroflexed views revealed no abnormalities.     The scope was then withdrawn from the patient and the procedure completed.  COMPLICATIONS: There were no immediate complications.  ENDOSCOPIC IMPRESSION: Noncritical Schatzki's ring?"not manipulated. Small hiatal hernia; otherwise normal EGD. Status post placement of a 20 French PEG tube as described above.  RECOMMENDATIONS: Water via PEG tube at 40 mL an hour -4urs. If okay in 4 hours, may begin tube feedings of choice. May use PEG tube for medications immediately.  The external bumper is to be loosened by 1 cm (to the 4.5 cm mark) on October 31. Keep abdominal binder in place for 2 weeks to allow PEG tract to mature; when necessary thereafter.  REPEAT EXAM:  eSigned:  R. Garfield Cornea, MD Rosalita Chessman [facility] 01/18/2014 2:07 PM    CC:  CPT CODES: ICD CODES:  The ICD and CPT codes recommended by this software are interpretations from the data that the clinical staff has captured with the software.  The verification of the translation of this report to the ICD and CPT codes and modifiers is the sole responsibility of the health care institution and practicing physician where this report was generated.  [employer] will not be held responsible for the validity of the ICD and CPT codes included on this report.  AMA assumes no liability for data contained or not contained herein. CPT is a Designer, television/film set of the [employer].  PATIENT NAME:  Alexander, Hanna MR#: [id number]

## 2014-01-15 ENCOUNTER — Encounter (HOSPITAL_COMMUNITY): Payer: Self-pay | Admitting: Internal Medicine

## 2014-01-18 DIAGNOSIS — R1311 Dysphagia, oral phase: Secondary | ICD-10-CM | POA: Diagnosis not present

## 2014-01-18 DIAGNOSIS — R1310 Dysphagia, unspecified: Secondary | ICD-10-CM | POA: Diagnosis not present

## 2014-01-18 DIAGNOSIS — M6281 Muscle weakness (generalized): Secondary | ICD-10-CM | POA: Diagnosis not present

## 2014-01-18 DIAGNOSIS — S73006A Unspecified dislocation of unspecified hip, initial encounter: Secondary | ICD-10-CM | POA: Diagnosis not present

## 2014-01-18 DIAGNOSIS — R262 Difficulty in walking, not elsewhere classified: Secondary | ICD-10-CM | POA: Diagnosis not present

## 2014-01-18 DIAGNOSIS — R1312 Dysphagia, oropharyngeal phase: Secondary | ICD-10-CM | POA: Diagnosis not present

## 2014-01-20 DIAGNOSIS — L98419 Non-pressure chronic ulcer of buttock with unspecified severity: Secondary | ICD-10-CM | POA: Diagnosis not present

## 2014-01-21 DIAGNOSIS — R531 Weakness: Secondary | ICD-10-CM | POA: Diagnosis not present

## 2014-01-21 DIAGNOSIS — R1310 Dysphagia, unspecified: Secondary | ICD-10-CM | POA: Diagnosis not present

## 2014-01-27 DIAGNOSIS — L98419 Non-pressure chronic ulcer of buttock with unspecified severity: Secondary | ICD-10-CM | POA: Diagnosis not present

## 2014-02-03 DIAGNOSIS — L98419 Non-pressure chronic ulcer of buttock with unspecified severity: Secondary | ICD-10-CM | POA: Diagnosis not present

## 2014-02-08 DIAGNOSIS — T814XXD Infection following a procedure, subsequent encounter: Secondary | ICD-10-CM | POA: Diagnosis not present

## 2014-02-16 DIAGNOSIS — S73006A Unspecified dislocation of unspecified hip, initial encounter: Secondary | ICD-10-CM | POA: Diagnosis not present

## 2014-02-16 DIAGNOSIS — M6281 Muscle weakness (generalized): Secondary | ICD-10-CM | POA: Diagnosis not present

## 2014-02-16 DIAGNOSIS — R1310 Dysphagia, unspecified: Secondary | ICD-10-CM | POA: Diagnosis not present

## 2014-02-16 DIAGNOSIS — R1312 Dysphagia, oropharyngeal phase: Secondary | ICD-10-CM | POA: Diagnosis not present

## 2014-02-16 DIAGNOSIS — R1311 Dysphagia, oral phase: Secondary | ICD-10-CM | POA: Diagnosis not present

## 2014-02-16 DIAGNOSIS — R262 Difficulty in walking, not elsewhere classified: Secondary | ICD-10-CM | POA: Diagnosis not present

## 2014-03-03 DIAGNOSIS — M79674 Pain in right toe(s): Secondary | ICD-10-CM | POA: Diagnosis not present

## 2014-03-03 DIAGNOSIS — L600 Ingrowing nail: Secondary | ICD-10-CM | POA: Diagnosis not present

## 2014-03-20 DIAGNOSIS — S73006A Unspecified dislocation of unspecified hip, initial encounter: Secondary | ICD-10-CM | POA: Diagnosis not present

## 2014-03-20 DIAGNOSIS — R262 Difficulty in walking, not elsewhere classified: Secondary | ICD-10-CM | POA: Diagnosis not present

## 2014-03-20 DIAGNOSIS — R1312 Dysphagia, oropharyngeal phase: Secondary | ICD-10-CM | POA: Diagnosis not present

## 2014-03-20 DIAGNOSIS — R1310 Dysphagia, unspecified: Secondary | ICD-10-CM | POA: Diagnosis not present

## 2014-03-20 DIAGNOSIS — M6281 Muscle weakness (generalized): Secondary | ICD-10-CM | POA: Diagnosis not present

## 2014-03-20 DIAGNOSIS — R1311 Dysphagia, oral phase: Secondary | ICD-10-CM | POA: Diagnosis not present

## 2014-03-22 DIAGNOSIS — R262 Difficulty in walking, not elsewhere classified: Secondary | ICD-10-CM | POA: Diagnosis not present

## 2014-03-22 DIAGNOSIS — R1311 Dysphagia, oral phase: Secondary | ICD-10-CM | POA: Diagnosis not present

## 2014-03-22 DIAGNOSIS — R1312 Dysphagia, oropharyngeal phase: Secondary | ICD-10-CM | POA: Diagnosis not present

## 2014-03-22 DIAGNOSIS — R1310 Dysphagia, unspecified: Secondary | ICD-10-CM | POA: Diagnosis not present

## 2014-03-22 DIAGNOSIS — M6281 Muscle weakness (generalized): Secondary | ICD-10-CM | POA: Diagnosis not present

## 2014-03-22 DIAGNOSIS — S73006A Unspecified dislocation of unspecified hip, initial encounter: Secondary | ICD-10-CM | POA: Diagnosis not present

## 2014-04-20 DIAGNOSIS — E871 Hypo-osmolality and hyponatremia: Secondary | ICD-10-CM | POA: Diagnosis not present

## 2014-04-20 DIAGNOSIS — D649 Anemia, unspecified: Secondary | ICD-10-CM | POA: Diagnosis not present

## 2014-04-20 DIAGNOSIS — E876 Hypokalemia: Secondary | ICD-10-CM | POA: Diagnosis not present

## 2014-04-20 DIAGNOSIS — E46 Unspecified protein-calorie malnutrition: Secondary | ICD-10-CM | POA: Diagnosis not present

## 2014-06-03 DIAGNOSIS — L600 Ingrowing nail: Secondary | ICD-10-CM | POA: Diagnosis not present

## 2014-06-03 DIAGNOSIS — M79675 Pain in left toe(s): Secondary | ICD-10-CM | POA: Diagnosis not present

## 2014-06-13 DIAGNOSIS — R6889 Other general symptoms and signs: Secondary | ICD-10-CM | POA: Diagnosis not present

## 2014-06-14 DIAGNOSIS — R1310 Dysphagia, unspecified: Secondary | ICD-10-CM | POA: Diagnosis not present

## 2014-06-14 DIAGNOSIS — L089 Local infection of the skin and subcutaneous tissue, unspecified: Secondary | ICD-10-CM | POA: Diagnosis not present

## 2014-06-16 DIAGNOSIS — B9562 Methicillin resistant Staphylococcus aureus infection as the cause of diseases classified elsewhere: Secondary | ICD-10-CM | POA: Diagnosis not present

## 2014-06-16 DIAGNOSIS — D649 Anemia, unspecified: Secondary | ICD-10-CM | POA: Diagnosis not present

## 2014-06-16 DIAGNOSIS — R6889 Other general symptoms and signs: Secondary | ICD-10-CM | POA: Diagnosis not present

## 2014-06-16 DIAGNOSIS — E875 Hyperkalemia: Secondary | ICD-10-CM | POA: Diagnosis not present

## 2014-06-16 DIAGNOSIS — Z79899 Other long term (current) drug therapy: Secondary | ICD-10-CM | POA: Diagnosis not present

## 2014-06-16 DIAGNOSIS — L089 Local infection of the skin and subcutaneous tissue, unspecified: Secondary | ICD-10-CM | POA: Diagnosis not present

## 2014-06-16 DIAGNOSIS — R1310 Dysphagia, unspecified: Secondary | ICD-10-CM | POA: Diagnosis not present

## 2014-06-16 DIAGNOSIS — E784 Other hyperlipidemia: Secondary | ICD-10-CM | POA: Diagnosis not present

## 2014-06-17 DIAGNOSIS — R1310 Dysphagia, unspecified: Secondary | ICD-10-CM | POA: Diagnosis not present

## 2014-06-17 DIAGNOSIS — L089 Local infection of the skin and subcutaneous tissue, unspecified: Secondary | ICD-10-CM | POA: Diagnosis not present

## 2014-06-17 DIAGNOSIS — B9562 Methicillin resistant Staphylococcus aureus infection as the cause of diseases classified elsewhere: Secondary | ICD-10-CM | POA: Diagnosis not present

## 2014-06-17 DIAGNOSIS — E875 Hyperkalemia: Secondary | ICD-10-CM | POA: Diagnosis not present

## 2014-06-19 DIAGNOSIS — Z79899 Other long term (current) drug therapy: Secondary | ICD-10-CM | POA: Diagnosis not present

## 2014-06-19 DIAGNOSIS — B9562 Methicillin resistant Staphylococcus aureus infection as the cause of diseases classified elsewhere: Secondary | ICD-10-CM | POA: Diagnosis not present

## 2014-06-19 DIAGNOSIS — R1310 Dysphagia, unspecified: Secondary | ICD-10-CM | POA: Diagnosis not present

## 2014-06-19 DIAGNOSIS — E784 Other hyperlipidemia: Secondary | ICD-10-CM | POA: Diagnosis not present

## 2014-06-19 DIAGNOSIS — L089 Local infection of the skin and subcutaneous tissue, unspecified: Secondary | ICD-10-CM | POA: Diagnosis not present

## 2014-06-19 DIAGNOSIS — R6889 Other general symptoms and signs: Secondary | ICD-10-CM | POA: Diagnosis not present

## 2014-06-19 DIAGNOSIS — E875 Hyperkalemia: Secondary | ICD-10-CM | POA: Diagnosis not present

## 2014-06-29 DIAGNOSIS — B9562 Methicillin resistant Staphylococcus aureus infection as the cause of diseases classified elsewhere: Secondary | ICD-10-CM | POA: Diagnosis not present

## 2014-06-29 DIAGNOSIS — D649 Anemia, unspecified: Secondary | ICD-10-CM | POA: Diagnosis not present

## 2014-06-29 DIAGNOSIS — E875 Hyperkalemia: Secondary | ICD-10-CM | POA: Diagnosis not present

## 2014-06-29 DIAGNOSIS — L089 Local infection of the skin and subcutaneous tissue, unspecified: Secondary | ICD-10-CM | POA: Diagnosis not present

## 2014-07-08 DIAGNOSIS — L089 Local infection of the skin and subcutaneous tissue, unspecified: Secondary | ICD-10-CM | POA: Diagnosis not present

## 2014-07-08 DIAGNOSIS — R1310 Dysphagia, unspecified: Secondary | ICD-10-CM | POA: Diagnosis not present

## 2014-07-08 DIAGNOSIS — R110 Nausea: Secondary | ICD-10-CM | POA: Diagnosis not present

## 2014-07-08 DIAGNOSIS — B9562 Methicillin resistant Staphylococcus aureus infection as the cause of diseases classified elsewhere: Secondary | ICD-10-CM | POA: Diagnosis not present

## 2014-07-12 DIAGNOSIS — Z79899 Other long term (current) drug therapy: Secondary | ICD-10-CM | POA: Diagnosis not present

## 2014-07-12 DIAGNOSIS — R6889 Other general symptoms and signs: Secondary | ICD-10-CM | POA: Diagnosis not present

## 2014-07-12 DIAGNOSIS — E039 Hypothyroidism, unspecified: Secondary | ICD-10-CM | POA: Diagnosis not present

## 2014-07-12 DIAGNOSIS — D649 Anemia, unspecified: Secondary | ICD-10-CM | POA: Diagnosis not present

## 2014-07-12 DIAGNOSIS — E559 Vitamin D deficiency, unspecified: Secondary | ICD-10-CM | POA: Diagnosis not present

## 2014-07-12 DIAGNOSIS — M6281 Muscle weakness (generalized): Secondary | ICD-10-CM | POA: Diagnosis not present

## 2014-07-12 DIAGNOSIS — E119 Type 2 diabetes mellitus without complications: Secondary | ICD-10-CM | POA: Diagnosis not present

## 2014-07-12 DIAGNOSIS — E785 Hyperlipidemia, unspecified: Secondary | ICD-10-CM | POA: Diagnosis not present

## 2014-07-21 DIAGNOSIS — E46 Unspecified protein-calorie malnutrition: Secondary | ICD-10-CM | POA: Diagnosis not present

## 2014-07-21 DIAGNOSIS — R1310 Dysphagia, unspecified: Secondary | ICD-10-CM | POA: Diagnosis not present

## 2014-07-22 DIAGNOSIS — M6281 Muscle weakness (generalized): Secondary | ICD-10-CM | POA: Diagnosis not present

## 2014-07-22 DIAGNOSIS — E46 Unspecified protein-calorie malnutrition: Secondary | ICD-10-CM | POA: Diagnosis not present

## 2014-07-22 DIAGNOSIS — R6889 Other general symptoms and signs: Secondary | ICD-10-CM | POA: Diagnosis not present

## 2014-07-22 DIAGNOSIS — E785 Hyperlipidemia, unspecified: Secondary | ICD-10-CM | POA: Diagnosis not present

## 2014-07-22 DIAGNOSIS — Z79899 Other long term (current) drug therapy: Secondary | ICD-10-CM | POA: Diagnosis not present

## 2014-07-22 DIAGNOSIS — R1310 Dysphagia, unspecified: Secondary | ICD-10-CM | POA: Diagnosis not present

## 2014-08-11 DIAGNOSIS — L600 Ingrowing nail: Secondary | ICD-10-CM | POA: Diagnosis not present

## 2014-08-11 DIAGNOSIS — M79675 Pain in left toe(s): Secondary | ICD-10-CM | POA: Diagnosis not present

## 2014-08-23 DIAGNOSIS — R6889 Other general symptoms and signs: Secondary | ICD-10-CM | POA: Diagnosis not present

## 2014-08-23 DIAGNOSIS — E039 Hypothyroidism, unspecified: Secondary | ICD-10-CM | POA: Diagnosis not present

## 2014-08-23 DIAGNOSIS — E559 Vitamin D deficiency, unspecified: Secondary | ICD-10-CM | POA: Diagnosis not present

## 2014-08-30 DIAGNOSIS — R05 Cough: Secondary | ICD-10-CM | POA: Diagnosis not present

## 2014-08-30 DIAGNOSIS — R1310 Dysphagia, unspecified: Secondary | ICD-10-CM | POA: Diagnosis not present

## 2014-08-31 DIAGNOSIS — R1310 Dysphagia, unspecified: Secondary | ICD-10-CM | POA: Diagnosis not present

## 2014-08-31 DIAGNOSIS — R6889 Other general symptoms and signs: Secondary | ICD-10-CM | POA: Diagnosis not present

## 2014-08-31 DIAGNOSIS — M6281 Muscle weakness (generalized): Secondary | ICD-10-CM | POA: Diagnosis not present

## 2014-08-31 DIAGNOSIS — Z79899 Other long term (current) drug therapy: Secondary | ICD-10-CM | POA: Diagnosis not present

## 2014-08-31 DIAGNOSIS — D649 Anemia, unspecified: Secondary | ICD-10-CM | POA: Diagnosis not present

## 2014-08-31 DIAGNOSIS — R05 Cough: Secondary | ICD-10-CM | POA: Diagnosis not present

## 2014-09-06 DIAGNOSIS — R1310 Dysphagia, unspecified: Secondary | ICD-10-CM | POA: Diagnosis not present

## 2014-09-06 DIAGNOSIS — L089 Local infection of the skin and subcutaneous tissue, unspecified: Secondary | ICD-10-CM | POA: Diagnosis not present

## 2014-09-06 DIAGNOSIS — R05 Cough: Secondary | ICD-10-CM | POA: Diagnosis not present

## 2014-09-07 DIAGNOSIS — R1310 Dysphagia, unspecified: Secondary | ICD-10-CM | POA: Diagnosis not present

## 2014-09-07 DIAGNOSIS — R05 Cough: Secondary | ICD-10-CM | POA: Diagnosis not present

## 2014-09-07 DIAGNOSIS — L089 Local infection of the skin and subcutaneous tissue, unspecified: Secondary | ICD-10-CM | POA: Diagnosis not present

## 2014-09-14 DIAGNOSIS — D649 Anemia, unspecified: Secondary | ICD-10-CM | POA: Diagnosis not present

## 2014-09-14 DIAGNOSIS — Z79899 Other long term (current) drug therapy: Secondary | ICD-10-CM | POA: Diagnosis not present

## 2014-09-14 DIAGNOSIS — M6281 Muscle weakness (generalized): Secondary | ICD-10-CM | POA: Diagnosis not present

## 2014-09-14 DIAGNOSIS — R6889 Other general symptoms and signs: Secondary | ICD-10-CM | POA: Diagnosis not present

## 2014-09-15 DIAGNOSIS — R05 Cough: Secondary | ICD-10-CM | POA: Diagnosis not present

## 2014-09-15 DIAGNOSIS — L03319 Cellulitis of trunk, unspecified: Secondary | ICD-10-CM | POA: Diagnosis not present

## 2014-09-15 DIAGNOSIS — R1310 Dysphagia, unspecified: Secondary | ICD-10-CM | POA: Diagnosis not present

## 2014-09-28 DIAGNOSIS — L03319 Cellulitis of trunk, unspecified: Secondary | ICD-10-CM | POA: Diagnosis not present

## 2014-09-28 DIAGNOSIS — M6281 Muscle weakness (generalized): Secondary | ICD-10-CM | POA: Diagnosis not present

## 2014-09-28 DIAGNOSIS — S73006A Unspecified dislocation of unspecified hip, initial encounter: Secondary | ICD-10-CM | POA: Diagnosis not present

## 2014-09-28 DIAGNOSIS — L089 Local infection of the skin and subcutaneous tissue, unspecified: Secondary | ICD-10-CM | POA: Diagnosis not present

## 2014-09-29 DIAGNOSIS — S73006A Unspecified dislocation of unspecified hip, initial encounter: Secondary | ICD-10-CM | POA: Diagnosis not present

## 2014-09-29 DIAGNOSIS — M6281 Muscle weakness (generalized): Secondary | ICD-10-CM | POA: Diagnosis not present

## 2014-09-29 DIAGNOSIS — D649 Anemia, unspecified: Secondary | ICD-10-CM | POA: Diagnosis not present

## 2014-09-29 DIAGNOSIS — L03319 Cellulitis of trunk, unspecified: Secondary | ICD-10-CM | POA: Diagnosis not present

## 2014-09-29 DIAGNOSIS — L089 Local infection of the skin and subcutaneous tissue, unspecified: Secondary | ICD-10-CM | POA: Diagnosis not present

## 2014-09-29 DIAGNOSIS — Z79899 Other long term (current) drug therapy: Secondary | ICD-10-CM | POA: Diagnosis not present

## 2014-09-29 DIAGNOSIS — R6889 Other general symptoms and signs: Secondary | ICD-10-CM | POA: Diagnosis not present

## 2014-09-30 DIAGNOSIS — M6281 Muscle weakness (generalized): Secondary | ICD-10-CM | POA: Diagnosis not present

## 2014-09-30 DIAGNOSIS — S73006A Unspecified dislocation of unspecified hip, initial encounter: Secondary | ICD-10-CM | POA: Diagnosis not present

## 2014-10-01 DIAGNOSIS — S73006A Unspecified dislocation of unspecified hip, initial encounter: Secondary | ICD-10-CM | POA: Diagnosis not present

## 2014-10-01 DIAGNOSIS — M6281 Muscle weakness (generalized): Secondary | ICD-10-CM | POA: Diagnosis not present

## 2014-10-01 DIAGNOSIS — L03319 Cellulitis of trunk, unspecified: Secondary | ICD-10-CM | POA: Diagnosis not present

## 2014-10-01 DIAGNOSIS — R6889 Other general symptoms and signs: Secondary | ICD-10-CM | POA: Diagnosis not present

## 2014-10-01 DIAGNOSIS — L089 Local infection of the skin and subcutaneous tissue, unspecified: Secondary | ICD-10-CM | POA: Diagnosis not present

## 2014-10-01 DIAGNOSIS — Z79899 Other long term (current) drug therapy: Secondary | ICD-10-CM | POA: Diagnosis not present

## 2014-10-01 DIAGNOSIS — D649 Anemia, unspecified: Secondary | ICD-10-CM | POA: Diagnosis not present

## 2014-10-01 DIAGNOSIS — A047 Enterocolitis due to Clostridium difficile: Secondary | ICD-10-CM | POA: Diagnosis not present

## 2014-10-02 DIAGNOSIS — L089 Local infection of the skin and subcutaneous tissue, unspecified: Secondary | ICD-10-CM | POA: Diagnosis not present

## 2014-10-02 DIAGNOSIS — L03319 Cellulitis of trunk, unspecified: Secondary | ICD-10-CM | POA: Diagnosis not present

## 2014-10-04 DIAGNOSIS — R6889 Other general symptoms and signs: Secondary | ICD-10-CM | POA: Diagnosis not present

## 2014-10-04 DIAGNOSIS — E039 Hypothyroidism, unspecified: Secondary | ICD-10-CM | POA: Diagnosis not present

## 2014-10-04 DIAGNOSIS — M6281 Muscle weakness (generalized): Secondary | ICD-10-CM | POA: Diagnosis not present

## 2014-10-04 DIAGNOSIS — S73006A Unspecified dislocation of unspecified hip, initial encounter: Secondary | ICD-10-CM | POA: Diagnosis not present

## 2014-10-05 DIAGNOSIS — M6281 Muscle weakness (generalized): Secondary | ICD-10-CM | POA: Diagnosis not present

## 2014-10-05 DIAGNOSIS — S73006A Unspecified dislocation of unspecified hip, initial encounter: Secondary | ICD-10-CM | POA: Diagnosis not present

## 2014-10-06 DIAGNOSIS — M6281 Muscle weakness (generalized): Secondary | ICD-10-CM | POA: Diagnosis not present

## 2014-10-06 DIAGNOSIS — S73006A Unspecified dislocation of unspecified hip, initial encounter: Secondary | ICD-10-CM | POA: Diagnosis not present

## 2014-10-07 DIAGNOSIS — M6281 Muscle weakness (generalized): Secondary | ICD-10-CM | POA: Diagnosis not present

## 2014-10-07 DIAGNOSIS — S73006A Unspecified dislocation of unspecified hip, initial encounter: Secondary | ICD-10-CM | POA: Diagnosis not present

## 2014-10-08 DIAGNOSIS — S73006A Unspecified dislocation of unspecified hip, initial encounter: Secondary | ICD-10-CM | POA: Diagnosis not present

## 2014-10-08 DIAGNOSIS — M6281 Muscle weakness (generalized): Secondary | ICD-10-CM | POA: Diagnosis not present

## 2014-10-11 DIAGNOSIS — S73006A Unspecified dislocation of unspecified hip, initial encounter: Secondary | ICD-10-CM | POA: Diagnosis not present

## 2014-10-11 DIAGNOSIS — M6281 Muscle weakness (generalized): Secondary | ICD-10-CM | POA: Diagnosis not present

## 2014-10-12 DIAGNOSIS — M6281 Muscle weakness (generalized): Secondary | ICD-10-CM | POA: Diagnosis not present

## 2014-10-12 DIAGNOSIS — S73006A Unspecified dislocation of unspecified hip, initial encounter: Secondary | ICD-10-CM | POA: Diagnosis not present

## 2014-10-13 DIAGNOSIS — S73006A Unspecified dislocation of unspecified hip, initial encounter: Secondary | ICD-10-CM | POA: Diagnosis not present

## 2014-10-13 DIAGNOSIS — M6281 Muscle weakness (generalized): Secondary | ICD-10-CM | POA: Diagnosis not present

## 2014-10-14 DIAGNOSIS — S73006A Unspecified dislocation of unspecified hip, initial encounter: Secondary | ICD-10-CM | POA: Diagnosis not present

## 2014-10-14 DIAGNOSIS — M6281 Muscle weakness (generalized): Secondary | ICD-10-CM | POA: Diagnosis not present

## 2014-10-15 DIAGNOSIS — S73006A Unspecified dislocation of unspecified hip, initial encounter: Secondary | ICD-10-CM | POA: Diagnosis not present

## 2014-10-15 DIAGNOSIS — M6281 Muscle weakness (generalized): Secondary | ICD-10-CM | POA: Diagnosis not present

## 2014-10-18 DIAGNOSIS — S73006A Unspecified dislocation of unspecified hip, initial encounter: Secondary | ICD-10-CM | POA: Diagnosis not present

## 2014-10-18 DIAGNOSIS — R6889 Other general symptoms and signs: Secondary | ICD-10-CM | POA: Diagnosis not present

## 2014-10-18 DIAGNOSIS — E039 Hypothyroidism, unspecified: Secondary | ICD-10-CM | POA: Diagnosis not present

## 2014-10-18 DIAGNOSIS — M6281 Muscle weakness (generalized): Secondary | ICD-10-CM | POA: Diagnosis not present

## 2014-10-19 DIAGNOSIS — S73006A Unspecified dislocation of unspecified hip, initial encounter: Secondary | ICD-10-CM | POA: Diagnosis not present

## 2014-10-19 DIAGNOSIS — M6281 Muscle weakness (generalized): Secondary | ICD-10-CM | POA: Diagnosis not present

## 2014-10-20 DIAGNOSIS — M6281 Muscle weakness (generalized): Secondary | ICD-10-CM | POA: Diagnosis not present

## 2014-10-20 DIAGNOSIS — S73006A Unspecified dislocation of unspecified hip, initial encounter: Secondary | ICD-10-CM | POA: Diagnosis not present

## 2014-10-21 DIAGNOSIS — M6281 Muscle weakness (generalized): Secondary | ICD-10-CM | POA: Diagnosis not present

## 2014-10-21 DIAGNOSIS — S73006A Unspecified dislocation of unspecified hip, initial encounter: Secondary | ICD-10-CM | POA: Diagnosis not present

## 2014-10-22 DIAGNOSIS — S73006A Unspecified dislocation of unspecified hip, initial encounter: Secondary | ICD-10-CM | POA: Diagnosis not present

## 2014-10-22 DIAGNOSIS — M6281 Muscle weakness (generalized): Secondary | ICD-10-CM | POA: Diagnosis not present

## 2014-10-25 DIAGNOSIS — M6281 Muscle weakness (generalized): Secondary | ICD-10-CM | POA: Diagnosis not present

## 2014-10-25 DIAGNOSIS — S73006A Unspecified dislocation of unspecified hip, initial encounter: Secondary | ICD-10-CM | POA: Diagnosis not present

## 2014-10-26 DIAGNOSIS — S73006A Unspecified dislocation of unspecified hip, initial encounter: Secondary | ICD-10-CM | POA: Diagnosis not present

## 2014-10-26 DIAGNOSIS — M6281 Muscle weakness (generalized): Secondary | ICD-10-CM | POA: Diagnosis not present

## 2014-10-27 DIAGNOSIS — S73006A Unspecified dislocation of unspecified hip, initial encounter: Secondary | ICD-10-CM | POA: Diagnosis not present

## 2014-10-27 DIAGNOSIS — M6281 Muscle weakness (generalized): Secondary | ICD-10-CM | POA: Diagnosis not present

## 2014-10-28 DIAGNOSIS — S73006A Unspecified dislocation of unspecified hip, initial encounter: Secondary | ICD-10-CM | POA: Diagnosis not present

## 2014-10-28 DIAGNOSIS — M6281 Muscle weakness (generalized): Secondary | ICD-10-CM | POA: Diagnosis not present

## 2014-10-29 DIAGNOSIS — M6281 Muscle weakness (generalized): Secondary | ICD-10-CM | POA: Diagnosis not present

## 2014-10-29 DIAGNOSIS — S73006A Unspecified dislocation of unspecified hip, initial encounter: Secondary | ICD-10-CM | POA: Diagnosis not present

## 2014-11-01 DIAGNOSIS — M6281 Muscle weakness (generalized): Secondary | ICD-10-CM | POA: Diagnosis not present

## 2014-11-01 DIAGNOSIS — S73006A Unspecified dislocation of unspecified hip, initial encounter: Secondary | ICD-10-CM | POA: Diagnosis not present

## 2014-11-02 DIAGNOSIS — S73006A Unspecified dislocation of unspecified hip, initial encounter: Secondary | ICD-10-CM | POA: Diagnosis not present

## 2014-11-02 DIAGNOSIS — M6281 Muscle weakness (generalized): Secondary | ICD-10-CM | POA: Diagnosis not present

## 2014-11-03 DIAGNOSIS — M79674 Pain in right toe(s): Secondary | ICD-10-CM | POA: Diagnosis not present

## 2014-11-03 DIAGNOSIS — M79675 Pain in left toe(s): Secondary | ICD-10-CM | POA: Diagnosis not present

## 2014-11-03 DIAGNOSIS — B351 Tinea unguium: Secondary | ICD-10-CM | POA: Diagnosis not present

## 2014-11-03 DIAGNOSIS — S73006A Unspecified dislocation of unspecified hip, initial encounter: Secondary | ICD-10-CM | POA: Diagnosis not present

## 2014-11-03 DIAGNOSIS — M6281 Muscle weakness (generalized): Secondary | ICD-10-CM | POA: Diagnosis not present

## 2014-11-04 DIAGNOSIS — M6281 Muscle weakness (generalized): Secondary | ICD-10-CM | POA: Diagnosis not present

## 2014-11-04 DIAGNOSIS — S73006A Unspecified dislocation of unspecified hip, initial encounter: Secondary | ICD-10-CM | POA: Diagnosis not present

## 2014-11-05 DIAGNOSIS — S73006A Unspecified dislocation of unspecified hip, initial encounter: Secondary | ICD-10-CM | POA: Diagnosis not present

## 2014-11-05 DIAGNOSIS — M6281 Muscle weakness (generalized): Secondary | ICD-10-CM | POA: Diagnosis not present

## 2014-11-08 DIAGNOSIS — M6281 Muscle weakness (generalized): Secondary | ICD-10-CM | POA: Diagnosis not present

## 2014-11-08 DIAGNOSIS — S73006A Unspecified dislocation of unspecified hip, initial encounter: Secondary | ICD-10-CM | POA: Diagnosis not present

## 2014-11-09 DIAGNOSIS — S73006A Unspecified dislocation of unspecified hip, initial encounter: Secondary | ICD-10-CM | POA: Diagnosis not present

## 2014-11-09 DIAGNOSIS — M6281 Muscle weakness (generalized): Secondary | ICD-10-CM | POA: Diagnosis not present

## 2014-11-10 DIAGNOSIS — M6281 Muscle weakness (generalized): Secondary | ICD-10-CM | POA: Diagnosis not present

## 2014-11-10 DIAGNOSIS — S73006A Unspecified dislocation of unspecified hip, initial encounter: Secondary | ICD-10-CM | POA: Diagnosis not present

## 2014-11-11 DIAGNOSIS — S73006A Unspecified dislocation of unspecified hip, initial encounter: Secondary | ICD-10-CM | POA: Diagnosis not present

## 2014-11-11 DIAGNOSIS — M6281 Muscle weakness (generalized): Secondary | ICD-10-CM | POA: Diagnosis not present

## 2014-11-12 DIAGNOSIS — S73006A Unspecified dislocation of unspecified hip, initial encounter: Secondary | ICD-10-CM | POA: Diagnosis not present

## 2014-11-12 DIAGNOSIS — M6281 Muscle weakness (generalized): Secondary | ICD-10-CM | POA: Diagnosis not present

## 2014-11-15 DIAGNOSIS — M6281 Muscle weakness (generalized): Secondary | ICD-10-CM | POA: Diagnosis not present

## 2014-11-15 DIAGNOSIS — S73006A Unspecified dislocation of unspecified hip, initial encounter: Secondary | ICD-10-CM | POA: Diagnosis not present

## 2014-11-16 DIAGNOSIS — M6281 Muscle weakness (generalized): Secondary | ICD-10-CM | POA: Diagnosis not present

## 2014-11-16 DIAGNOSIS — S73006A Unspecified dislocation of unspecified hip, initial encounter: Secondary | ICD-10-CM | POA: Diagnosis not present

## 2014-11-17 DIAGNOSIS — S73006A Unspecified dislocation of unspecified hip, initial encounter: Secondary | ICD-10-CM | POA: Diagnosis not present

## 2014-11-17 DIAGNOSIS — M6281 Muscle weakness (generalized): Secondary | ICD-10-CM | POA: Diagnosis not present

## 2014-11-18 DIAGNOSIS — M6281 Muscle weakness (generalized): Secondary | ICD-10-CM | POA: Diagnosis not present

## 2014-11-18 DIAGNOSIS — S73006A Unspecified dislocation of unspecified hip, initial encounter: Secondary | ICD-10-CM | POA: Diagnosis not present

## 2014-11-19 DIAGNOSIS — M6281 Muscle weakness (generalized): Secondary | ICD-10-CM | POA: Diagnosis not present

## 2014-11-19 DIAGNOSIS — S73006A Unspecified dislocation of unspecified hip, initial encounter: Secondary | ICD-10-CM | POA: Diagnosis not present

## 2014-11-23 DIAGNOSIS — S73006A Unspecified dislocation of unspecified hip, initial encounter: Secondary | ICD-10-CM | POA: Diagnosis not present

## 2014-11-23 DIAGNOSIS — M6281 Muscle weakness (generalized): Secondary | ICD-10-CM | POA: Diagnosis not present

## 2014-11-23 DIAGNOSIS — L03311 Cellulitis of abdominal wall: Secondary | ICD-10-CM | POA: Diagnosis not present

## 2014-11-23 DIAGNOSIS — K5900 Constipation, unspecified: Secondary | ICD-10-CM | POA: Diagnosis not present

## 2014-11-23 DIAGNOSIS — B351 Tinea unguium: Secondary | ICD-10-CM | POA: Diagnosis not present

## 2014-11-24 DIAGNOSIS — S73006A Unspecified dislocation of unspecified hip, initial encounter: Secondary | ICD-10-CM | POA: Diagnosis not present

## 2014-11-24 DIAGNOSIS — M6281 Muscle weakness (generalized): Secondary | ICD-10-CM | POA: Diagnosis not present

## 2014-11-25 DIAGNOSIS — S73006A Unspecified dislocation of unspecified hip, initial encounter: Secondary | ICD-10-CM | POA: Diagnosis not present

## 2014-11-25 DIAGNOSIS — M6281 Muscle weakness (generalized): Secondary | ICD-10-CM | POA: Diagnosis not present

## 2014-11-26 DIAGNOSIS — M6281 Muscle weakness (generalized): Secondary | ICD-10-CM | POA: Diagnosis not present

## 2014-11-26 DIAGNOSIS — S73006A Unspecified dislocation of unspecified hip, initial encounter: Secondary | ICD-10-CM | POA: Diagnosis not present

## 2014-11-27 DIAGNOSIS — S73006A Unspecified dislocation of unspecified hip, initial encounter: Secondary | ICD-10-CM | POA: Diagnosis not present

## 2014-11-27 DIAGNOSIS — M6281 Muscle weakness (generalized): Secondary | ICD-10-CM | POA: Diagnosis not present

## 2014-11-29 DIAGNOSIS — M6281 Muscle weakness (generalized): Secondary | ICD-10-CM | POA: Diagnosis not present

## 2014-11-29 DIAGNOSIS — S73006A Unspecified dislocation of unspecified hip, initial encounter: Secondary | ICD-10-CM | POA: Diagnosis not present

## 2014-11-30 DIAGNOSIS — M6281 Muscle weakness (generalized): Secondary | ICD-10-CM | POA: Diagnosis not present

## 2014-11-30 DIAGNOSIS — S73006A Unspecified dislocation of unspecified hip, initial encounter: Secondary | ICD-10-CM | POA: Diagnosis not present

## 2014-12-01 DIAGNOSIS — M6281 Muscle weakness (generalized): Secondary | ICD-10-CM | POA: Diagnosis not present

## 2014-12-01 DIAGNOSIS — S73006A Unspecified dislocation of unspecified hip, initial encounter: Secondary | ICD-10-CM | POA: Diagnosis not present

## 2014-12-02 DIAGNOSIS — M6281 Muscle weakness (generalized): Secondary | ICD-10-CM | POA: Diagnosis not present

## 2014-12-02 DIAGNOSIS — S73006A Unspecified dislocation of unspecified hip, initial encounter: Secondary | ICD-10-CM | POA: Diagnosis not present

## 2014-12-03 DIAGNOSIS — M6281 Muscle weakness (generalized): Secondary | ICD-10-CM | POA: Diagnosis not present

## 2014-12-03 DIAGNOSIS — S73006A Unspecified dislocation of unspecified hip, initial encounter: Secondary | ICD-10-CM | POA: Diagnosis not present

## 2014-12-06 DIAGNOSIS — R51 Headache: Secondary | ICD-10-CM | POA: Diagnosis not present

## 2014-12-06 DIAGNOSIS — S73006A Unspecified dislocation of unspecified hip, initial encounter: Secondary | ICD-10-CM | POA: Diagnosis not present

## 2014-12-06 DIAGNOSIS — L03319 Cellulitis of trunk, unspecified: Secondary | ICD-10-CM | POA: Diagnosis not present

## 2014-12-06 DIAGNOSIS — M6281 Muscle weakness (generalized): Secondary | ICD-10-CM | POA: Diagnosis not present

## 2014-12-06 DIAGNOSIS — L089 Local infection of the skin and subcutaneous tissue, unspecified: Secondary | ICD-10-CM | POA: Diagnosis not present

## 2014-12-07 DIAGNOSIS — S73006A Unspecified dislocation of unspecified hip, initial encounter: Secondary | ICD-10-CM | POA: Diagnosis not present

## 2014-12-07 DIAGNOSIS — M6281 Muscle weakness (generalized): Secondary | ICD-10-CM | POA: Diagnosis not present

## 2014-12-08 DIAGNOSIS — M6281 Muscle weakness (generalized): Secondary | ICD-10-CM | POA: Diagnosis not present

## 2014-12-08 DIAGNOSIS — S73006A Unspecified dislocation of unspecified hip, initial encounter: Secondary | ICD-10-CM | POA: Diagnosis not present

## 2014-12-09 DIAGNOSIS — M6281 Muscle weakness (generalized): Secondary | ICD-10-CM | POA: Diagnosis not present

## 2014-12-09 DIAGNOSIS — S73006A Unspecified dislocation of unspecified hip, initial encounter: Secondary | ICD-10-CM | POA: Diagnosis not present

## 2014-12-10 DIAGNOSIS — S73006A Unspecified dislocation of unspecified hip, initial encounter: Secondary | ICD-10-CM | POA: Diagnosis not present

## 2014-12-10 DIAGNOSIS — M6281 Muscle weakness (generalized): Secondary | ICD-10-CM | POA: Diagnosis not present

## 2014-12-13 DIAGNOSIS — M6281 Muscle weakness (generalized): Secondary | ICD-10-CM | POA: Diagnosis not present

## 2014-12-13 DIAGNOSIS — S73006A Unspecified dislocation of unspecified hip, initial encounter: Secondary | ICD-10-CM | POA: Diagnosis not present

## 2014-12-14 DIAGNOSIS — M6281 Muscle weakness (generalized): Secondary | ICD-10-CM | POA: Diagnosis not present

## 2014-12-14 DIAGNOSIS — S73006A Unspecified dislocation of unspecified hip, initial encounter: Secondary | ICD-10-CM | POA: Diagnosis not present

## 2014-12-15 DIAGNOSIS — S73006A Unspecified dislocation of unspecified hip, initial encounter: Secondary | ICD-10-CM | POA: Diagnosis not present

## 2014-12-15 DIAGNOSIS — M6281 Muscle weakness (generalized): Secondary | ICD-10-CM | POA: Diagnosis not present

## 2014-12-16 DIAGNOSIS — M6281 Muscle weakness (generalized): Secondary | ICD-10-CM | POA: Diagnosis not present

## 2014-12-16 DIAGNOSIS — S73006A Unspecified dislocation of unspecified hip, initial encounter: Secondary | ICD-10-CM | POA: Diagnosis not present

## 2014-12-17 DIAGNOSIS — M6281 Muscle weakness (generalized): Secondary | ICD-10-CM | POA: Diagnosis not present

## 2014-12-17 DIAGNOSIS — S73006A Unspecified dislocation of unspecified hip, initial encounter: Secondary | ICD-10-CM | POA: Diagnosis not present

## 2014-12-20 DIAGNOSIS — S73006A Unspecified dislocation of unspecified hip, initial encounter: Secondary | ICD-10-CM | POA: Diagnosis not present

## 2014-12-20 DIAGNOSIS — M6281 Muscle weakness (generalized): Secondary | ICD-10-CM | POA: Diagnosis not present

## 2014-12-21 DIAGNOSIS — S73006A Unspecified dislocation of unspecified hip, initial encounter: Secondary | ICD-10-CM | POA: Diagnosis not present

## 2014-12-21 DIAGNOSIS — M6281 Muscle weakness (generalized): Secondary | ICD-10-CM | POA: Diagnosis not present

## 2014-12-22 DIAGNOSIS — T7849XD Other allergy, subsequent encounter: Secondary | ICD-10-CM | POA: Diagnosis not present

## 2014-12-22 DIAGNOSIS — E039 Hypothyroidism, unspecified: Secondary | ICD-10-CM | POA: Diagnosis not present

## 2014-12-22 DIAGNOSIS — E559 Vitamin D deficiency, unspecified: Secondary | ICD-10-CM | POA: Diagnosis not present

## 2014-12-22 DIAGNOSIS — D649 Anemia, unspecified: Secondary | ICD-10-CM | POA: Diagnosis not present

## 2014-12-22 DIAGNOSIS — S73006A Unspecified dislocation of unspecified hip, initial encounter: Secondary | ICD-10-CM | POA: Diagnosis not present

## 2014-12-22 DIAGNOSIS — M6281 Muscle weakness (generalized): Secondary | ICD-10-CM | POA: Diagnosis not present

## 2014-12-22 DIAGNOSIS — E785 Hyperlipidemia, unspecified: Secondary | ICD-10-CM | POA: Diagnosis not present

## 2014-12-22 DIAGNOSIS — R6889 Other general symptoms and signs: Secondary | ICD-10-CM | POA: Diagnosis not present

## 2014-12-22 DIAGNOSIS — Z79899 Other long term (current) drug therapy: Secondary | ICD-10-CM | POA: Diagnosis not present

## 2014-12-22 DIAGNOSIS — E119 Type 2 diabetes mellitus without complications: Secondary | ICD-10-CM | POA: Diagnosis not present

## 2014-12-22 DIAGNOSIS — E46 Unspecified protein-calorie malnutrition: Secondary | ICD-10-CM | POA: Diagnosis not present

## 2014-12-23 DIAGNOSIS — S73006A Unspecified dislocation of unspecified hip, initial encounter: Secondary | ICD-10-CM | POA: Diagnosis not present

## 2014-12-23 DIAGNOSIS — M6281 Muscle weakness (generalized): Secondary | ICD-10-CM | POA: Diagnosis not present

## 2014-12-25 DIAGNOSIS — M6281 Muscle weakness (generalized): Secondary | ICD-10-CM | POA: Diagnosis not present

## 2014-12-25 DIAGNOSIS — S73006A Unspecified dislocation of unspecified hip, initial encounter: Secondary | ICD-10-CM | POA: Diagnosis not present

## 2014-12-27 DIAGNOSIS — S73006A Unspecified dislocation of unspecified hip, initial encounter: Secondary | ICD-10-CM | POA: Diagnosis not present

## 2014-12-27 DIAGNOSIS — M6281 Muscle weakness (generalized): Secondary | ICD-10-CM | POA: Diagnosis not present

## 2014-12-28 DIAGNOSIS — S73006A Unspecified dislocation of unspecified hip, initial encounter: Secondary | ICD-10-CM | POA: Diagnosis not present

## 2014-12-28 DIAGNOSIS — M6281 Muscle weakness (generalized): Secondary | ICD-10-CM | POA: Diagnosis not present

## 2014-12-29 DIAGNOSIS — S73006A Unspecified dislocation of unspecified hip, initial encounter: Secondary | ICD-10-CM | POA: Diagnosis not present

## 2014-12-29 DIAGNOSIS — M6281 Muscle weakness (generalized): Secondary | ICD-10-CM | POA: Diagnosis not present

## 2014-12-30 DIAGNOSIS — T84021A Dislocation of internal left hip prosthesis, initial encounter: Secondary | ICD-10-CM | POA: Diagnosis not present

## 2014-12-30 DIAGNOSIS — R52 Pain, unspecified: Secondary | ICD-10-CM | POA: Diagnosis not present

## 2014-12-30 DIAGNOSIS — M6281 Muscle weakness (generalized): Secondary | ICD-10-CM | POA: Diagnosis not present

## 2014-12-30 DIAGNOSIS — M79605 Pain in left leg: Secondary | ICD-10-CM | POA: Diagnosis not present

## 2014-12-30 DIAGNOSIS — S73006A Unspecified dislocation of unspecified hip, initial encounter: Secondary | ICD-10-CM | POA: Diagnosis not present

## 2015-01-01 DIAGNOSIS — M6281 Muscle weakness (generalized): Secondary | ICD-10-CM | POA: Diagnosis not present

## 2015-01-01 DIAGNOSIS — S73006A Unspecified dislocation of unspecified hip, initial encounter: Secondary | ICD-10-CM | POA: Diagnosis not present

## 2015-01-03 DIAGNOSIS — M6281 Muscle weakness (generalized): Secondary | ICD-10-CM | POA: Diagnosis not present

## 2015-01-03 DIAGNOSIS — S73006A Unspecified dislocation of unspecified hip, initial encounter: Secondary | ICD-10-CM | POA: Diagnosis not present

## 2015-01-04 DIAGNOSIS — S73006A Unspecified dislocation of unspecified hip, initial encounter: Secondary | ICD-10-CM | POA: Diagnosis not present

## 2015-01-04 DIAGNOSIS — M6281 Muscle weakness (generalized): Secondary | ICD-10-CM | POA: Diagnosis not present

## 2015-01-05 DIAGNOSIS — S73006A Unspecified dislocation of unspecified hip, initial encounter: Secondary | ICD-10-CM | POA: Diagnosis not present

## 2015-01-05 DIAGNOSIS — M6281 Muscle weakness (generalized): Secondary | ICD-10-CM | POA: Diagnosis not present

## 2015-01-06 DIAGNOSIS — M6281 Muscle weakness (generalized): Secondary | ICD-10-CM | POA: Diagnosis not present

## 2015-01-06 DIAGNOSIS — S73006A Unspecified dislocation of unspecified hip, initial encounter: Secondary | ICD-10-CM | POA: Diagnosis not present

## 2015-01-07 DIAGNOSIS — R6889 Other general symptoms and signs: Secondary | ICD-10-CM | POA: Diagnosis not present

## 2015-01-07 DIAGNOSIS — E785 Hyperlipidemia, unspecified: Secondary | ICD-10-CM | POA: Diagnosis not present

## 2015-01-07 DIAGNOSIS — M6281 Muscle weakness (generalized): Secondary | ICD-10-CM | POA: Diagnosis not present

## 2015-01-07 DIAGNOSIS — S73006A Unspecified dislocation of unspecified hip, initial encounter: Secondary | ICD-10-CM | POA: Diagnosis not present

## 2015-01-10 DIAGNOSIS — M6281 Muscle weakness (generalized): Secondary | ICD-10-CM | POA: Diagnosis not present

## 2015-01-10 DIAGNOSIS — S73006A Unspecified dislocation of unspecified hip, initial encounter: Secondary | ICD-10-CM | POA: Diagnosis not present

## 2015-01-11 DIAGNOSIS — S73006A Unspecified dislocation of unspecified hip, initial encounter: Secondary | ICD-10-CM | POA: Diagnosis not present

## 2015-01-11 DIAGNOSIS — M6281 Muscle weakness (generalized): Secondary | ICD-10-CM | POA: Diagnosis not present

## 2015-01-12 DIAGNOSIS — S73006A Unspecified dislocation of unspecified hip, initial encounter: Secondary | ICD-10-CM | POA: Diagnosis not present

## 2015-01-12 DIAGNOSIS — M6281 Muscle weakness (generalized): Secondary | ICD-10-CM | POA: Diagnosis not present

## 2015-01-13 DIAGNOSIS — M6281 Muscle weakness (generalized): Secondary | ICD-10-CM | POA: Diagnosis not present

## 2015-01-13 DIAGNOSIS — S73006A Unspecified dislocation of unspecified hip, initial encounter: Secondary | ICD-10-CM | POA: Diagnosis not present

## 2015-01-14 DIAGNOSIS — M6281 Muscle weakness (generalized): Secondary | ICD-10-CM | POA: Diagnosis not present

## 2015-01-14 DIAGNOSIS — S73006A Unspecified dislocation of unspecified hip, initial encounter: Secondary | ICD-10-CM | POA: Diagnosis not present

## 2015-01-17 DIAGNOSIS — S73006A Unspecified dislocation of unspecified hip, initial encounter: Secondary | ICD-10-CM | POA: Diagnosis not present

## 2015-01-17 DIAGNOSIS — M6281 Muscle weakness (generalized): Secondary | ICD-10-CM | POA: Diagnosis not present

## 2015-01-18 DIAGNOSIS — S73006A Unspecified dislocation of unspecified hip, initial encounter: Secondary | ICD-10-CM | POA: Diagnosis not present

## 2015-01-18 DIAGNOSIS — M6281 Muscle weakness (generalized): Secondary | ICD-10-CM | POA: Diagnosis not present

## 2015-01-19 DIAGNOSIS — M6281 Muscle weakness (generalized): Secondary | ICD-10-CM | POA: Diagnosis not present

## 2015-01-19 DIAGNOSIS — M79675 Pain in left toe(s): Secondary | ICD-10-CM | POA: Diagnosis not present

## 2015-01-19 DIAGNOSIS — B351 Tinea unguium: Secondary | ICD-10-CM | POA: Diagnosis not present

## 2015-01-19 DIAGNOSIS — S73006A Unspecified dislocation of unspecified hip, initial encounter: Secondary | ICD-10-CM | POA: Diagnosis not present

## 2015-01-20 DIAGNOSIS — S73006A Unspecified dislocation of unspecified hip, initial encounter: Secondary | ICD-10-CM | POA: Diagnosis not present

## 2015-01-20 DIAGNOSIS — M6281 Muscle weakness (generalized): Secondary | ICD-10-CM | POA: Diagnosis not present

## 2015-01-21 DIAGNOSIS — M6281 Muscle weakness (generalized): Secondary | ICD-10-CM | POA: Diagnosis not present

## 2015-01-21 DIAGNOSIS — S73006A Unspecified dislocation of unspecified hip, initial encounter: Secondary | ICD-10-CM | POA: Diagnosis not present

## 2015-01-24 DIAGNOSIS — S73006A Unspecified dislocation of unspecified hip, initial encounter: Secondary | ICD-10-CM | POA: Diagnosis not present

## 2015-01-24 DIAGNOSIS — M6281 Muscle weakness (generalized): Secondary | ICD-10-CM | POA: Diagnosis not present

## 2015-01-25 ENCOUNTER — Ambulatory Visit (HOSPITAL_COMMUNITY)
Admission: RE | Admit: 2015-01-25 | Discharge: 2015-01-25 | Disposition: A | Discharge status: Home / Self Care | Payer: Medicare Other | Source: Ambulatory Visit | Attending: Internal Medicine | Admitting: Internal Medicine

## 2015-01-25 ENCOUNTER — Other Ambulatory Visit (HOSPITAL_COMMUNITY): Payer: Self-pay | Admitting: Internal Medicine

## 2015-01-25 DIAGNOSIS — T148 Other injury of unspecified body region: Secondary | ICD-10-CM | POA: Diagnosis present

## 2015-01-25 DIAGNOSIS — S73005A Unspecified dislocation of left hip, initial encounter: Secondary | ICD-10-CM | POA: Diagnosis not present

## 2015-01-25 DIAGNOSIS — M25552 Pain in left hip: Secondary | ICD-10-CM | POA: Diagnosis not present

## 2015-01-25 DIAGNOSIS — T84021A Dislocation of internal left hip prosthesis, initial encounter: Secondary | ICD-10-CM | POA: Diagnosis not present

## 2015-01-25 DIAGNOSIS — IMO0001 Reserved for inherently not codable concepts without codable children: Secondary | ICD-10-CM

## 2015-01-26 DIAGNOSIS — M79605 Pain in left leg: Secondary | ICD-10-CM | POA: Diagnosis not present

## 2015-01-26 DIAGNOSIS — K9422 Gastrostomy infection: Secondary | ICD-10-CM | POA: Diagnosis not present

## 2015-01-27 DIAGNOSIS — Z96642 Presence of left artificial hip joint: Secondary | ICD-10-CM | POA: Diagnosis not present

## 2015-01-27 DIAGNOSIS — S73005A Unspecified dislocation of left hip, initial encounter: Secondary | ICD-10-CM | POA: Diagnosis not present

## 2015-01-28 DIAGNOSIS — M79605 Pain in left leg: Secondary | ICD-10-CM | POA: Diagnosis not present

## 2015-01-28 DIAGNOSIS — K9422 Gastrostomy infection: Secondary | ICD-10-CM | POA: Diagnosis not present

## 2015-01-28 DIAGNOSIS — R6889 Other general symptoms and signs: Secondary | ICD-10-CM | POA: Diagnosis not present

## 2015-01-28 DIAGNOSIS — D649 Anemia, unspecified: Secondary | ICD-10-CM | POA: Diagnosis not present

## 2015-01-31 DIAGNOSIS — K9422 Gastrostomy infection: Secondary | ICD-10-CM | POA: Diagnosis not present

## 2015-01-31 DIAGNOSIS — E46 Unspecified protein-calorie malnutrition: Secondary | ICD-10-CM | POA: Diagnosis not present

## 2015-01-31 DIAGNOSIS — R1310 Dysphagia, unspecified: Secondary | ICD-10-CM | POA: Diagnosis not present

## 2015-02-18 DIAGNOSIS — K9422 Gastrostomy infection: Secondary | ICD-10-CM | POA: Diagnosis not present

## 2015-02-18 DIAGNOSIS — R1310 Dysphagia, unspecified: Secondary | ICD-10-CM | POA: Diagnosis not present

## 2015-02-18 DIAGNOSIS — E46 Unspecified protein-calorie malnutrition: Secondary | ICD-10-CM | POA: Diagnosis not present

## 2015-02-21 ENCOUNTER — Telehealth: Payer: Self-pay | Admitting: Internal Medicine

## 2015-02-21 NOTE — Telephone Encounter (Signed)
Talked with [name](nurse) last time the PEG was used was July. Pt is eating and take oral medications fine. He would like for the PEG to be taking out. Please advise  If he will need an office visit first

## 2015-02-21 NOTE — Telephone Encounter (Signed)
[facility] CALLING TO MAKE APPOINTMENT FOR PEG REMOVAL

## 2015-03-01 ENCOUNTER — Telehealth: Payer: Self-pay | Admitting: Internal Medicine

## 2015-03-01 NOTE — Telephone Encounter (Signed)
I called Estill Bamberg back and she is to refax the records on this patient prior to making OV. She said she faxed them on 12/5, but would fax them again.

## 2015-03-01 NOTE — Telephone Encounter (Signed)
If pt wants it removed, he has to come in for an office visit first. Please schedule.

## 2015-03-01 NOTE — Telephone Encounter (Signed)
Estill Bamberg from [place] called today to follow up on referral for patient to have PEG tube removal. She said papers had been faxed last week and GF had spoke to someone from [place] on 12/5. Please advise and call Estill Bamberg back at [phone]

## 2015-03-08 ENCOUNTER — Other Ambulatory Visit: Payer: Self-pay

## 2015-03-08 ENCOUNTER — Encounter: Payer: Self-pay | Admitting: Gastroenterology

## 2015-03-08 ENCOUNTER — Ambulatory Visit (INDEPENDENT_AMBULATORY_CARE_PROVIDER_SITE_OTHER): Payer: Medicare Other | Admitting: Gastroenterology

## 2015-03-08 VITALS — BP 109/43 | HR 75 | Temp 97.1°F | Ht 70.0 in | Wt 155.0 lb

## 2015-03-08 DIAGNOSIS — K9422 Gastrostomy infection: Secondary | ICD-10-CM

## 2015-03-08 DIAGNOSIS — Z931 Gastrostomy status: Secondary | ICD-10-CM

## 2015-03-08 NOTE — Patient Instructions (Signed)
We have scheduled you for removal of the feeding tube.  Merry Christmas!

## 2015-03-09 ENCOUNTER — Encounter: Payer: Self-pay | Admitting: Gastroenterology

## 2015-03-09 NOTE — Assessment & Plan Note (Signed)
74 year old very pleasant male with a history of PEG tube placement in October of last year for supplemental nutrition. He has come a considerable way in progressing overall from last seen. He is tolerating a soft mechanical diet without any sign/symptoms of aspiration; he has not used the PEG tube since June, and speech therapy has cleared him to have solely oral feeding. He has had several G tube site infections managed by [facility], and the patient and family are desiring PEG tube removal as he is not using this anymore. I discussed that removal of the G tube could be performed, and I also discussed that if his health were to decline in the distant future, that he would not have this to rely on and could possibly require supplemental nutrition IF the family and patient desired this. The healthcare team at [facility] and patient's family have discussed this in detail, and the patient's brother is present today with him and quite knowledgeable and heavily involved in patient's care. As patient has not used this for almost 6 months, removal can be undertaken. As of note, he appears to be doing significantly better since I saw him a year ago, and his strength has improved considerably.   Proceed with G tube removal in endoscopy suite in the near future. Risks and benefits discussed with stated understanding. Patient and family very much want G tube removed and are in agreement.

## 2015-03-09 NOTE — Progress Notes (Signed)
Referring Provider: Lucia Gaskins, MD Primary Care Physician:  Maricela Curet, MD  Primary GI: Dr. Gala Romney   Chief Complaint  Patient presents with  . Dysphagia  . infection in [place]    HPI:   Alexander Hanna is a 74 y.o. male presenting today with a history of malnutrition in the past, mentally challenged, history of weight loss, dysphagia. He underwent 20 F PEG tube placement in Oct 2015 due to need for supplemental nutrition. EGD at time of PEG placement with non-critical Schatzki's ring not manipulated. We have not seen him since PEG tube placement.  He returns today with notable weight increase, as he is now taking in everything by mouth and has not used the tube since September 13, 2014. His brother, POA, is present with him. He has had several G-tube site infections that have been managed by the nursing facility, and he desires the PEG tube to be removed as it has not been used whatsoever recently.   He has been followed by Speech therapy quite closely and has been tolerating a soft mechanical diet, nectar thick liquids, without any signs/symptoms of aspiration. Speech therapy has cleared him. Last albumin 3.5. The family met with the healthcare providers and desire PEG tube removal. The patient is desirous of this as well. Although patient is mentally challenged, he is able to converse with me and state he desires this removed as well.    Past Medical History  Diagnosis Date  . Polio     as a child  . Environmental allergies   . Hypothyroidism   . Overactive bladder   . 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 Cholecystectomy    . Cataract extraction w/phaco  12/20/2011    Procedure: CATARACT EXTRACTION PHACO AND INTRAOCULAR LENS PLACEMENT (IOC);  Surgeon: Tonny Branch, MD;  Location: [facility];  Service: Ophthalmology;  Laterality: Left;  CDE: 14.80  . Cataract extraction w/phaco  01/14/2012    Procedure:  CATARACT EXTRACTION PHACO AND INTRAOCULAR LENS PLACEMENT (IOC);  Surgeon: Tonny Branch, MD;  Location: [facility];  Service: Ophthalmology;  Laterality: Right;  CDE 19.57  . Joint replacement      rt anterior hip  . Joint replacement      Left hip  . Wound debridement Left     Post hip replacement  . Esophagogastroduodenoscopy N/A 01/14/2014    Dr. Rourk:non-critical schatzki ring, not manipulated/small HHotherwise normal. 20 F PEG tube placed   . Peg placement N/A 01/14/2014    Procedure: PERCUTANEOUS ENDOSCOPIC GASTROSTOMY (PEG) PLACEMENT;  Surgeon: Daneil Dolin, MD;  Location: [facility] SUITE;  Service: Endoscopy;  Laterality: N/A;    Current Outpatient Prescriptions  Medication Sig Dispense Refill  . acetaminophen (TYLENOL) 325 MG tablet Take 650 mg by mouth 3 (three) times daily.    Marland Kitchen acidophilus (RISAQUAD) CAPS capsule Take 1 capsule by mouth 2 (two) times daily.    . Amino Acids-Protein Hydrolys (FEEDING SUPPLEMENT, PRO-STAT SUGAR FREE 64,) LIQD Take 30 mLs by mouth 2 (two) times daily.    Marland Kitchen ascorbic acid (VITAMIN C) 500 MG tablet Take 500 mg by mouth daily.    Marland Kitchen aspirin EC 81 MG tablet Take 81 mg by mouth daily.    Marland Kitchen atorvastatin (LIPITOR) 10 MG tablet Take 10 mg by mouth daily at 6 PM.     . docusate sodium (COLACE) 100 MG capsule Take 100  mg by mouth 2 (two) times daily.    Marland Kitchen dutasteride (AVODART) 0.5 MG capsule Take 0.5 mg by mouth daily.    Marland Kitchen ezetimibe (ZETIA) 10 MG tablet Take 10 mg by mouth daily.    . ferrous sulfate 325 (65 FE) MG tablet Take 325 mg by mouth 2 (two) times daily.     . finasteride (PROSCAR) 5 MG tablet Take 5 mg by mouth daily.     . fluticasone (FLONASE) 50 MCG/ACT nasal spray Place 2 sprays into the nose daily. Allergies    . guaiFENesin (MUCINEX) 600 MG 12 hr tablet Take by mouth 2 (two) times daily.    Marland Kitchen HYDROcodone-acetaminophen (NORCO/VICODIN) 5-325 MG per tablet Take 1 tablet by mouth every 6 (six) hours as needed for moderate pain. Takes 2 tablets every 6  hours    . levothyroxine (SYNTHROID, LEVOTHROID) 175 MCG tablet Take 175 mcg by mouth daily before breakfast.     . Multiple Vitamin (MULTIVITAMIN) tablet Take 1 tablet by mouth daily.    . niacin (NIASPAN) 500 MG CR tablet Take 500 mg by mouth daily.     . niacin-simvastatin (SIMCOR) 500-20 MG 24 hr tablet Take 1 tablet by mouth at bedtime.    . Nutritional Supplements (RESOURCE 2.0) LIQD Take 120 mLs by mouth 2 (two) times daily.    Marland Kitchen omeprazole (PRILOSEC) 20 MG capsule Take 20 mg by mouth daily.     . pantoprazole (PROTONIX) 40 MG tablet Take 40 mg by mouth daily.    . polyethylene glycol (MIRALAX / GLYCOLAX) packet Take 17 g by mouth daily.    . tamsulosin (FLOMAX) 0.4 MG CAPS capsule Take 0.4 mg by mouth daily.     . traMADol (ULTRAM) 50 MG tablet Take 50 mg by mouth every 6 (six) hours as needed for moderate pain.     . traZODone (DESYREL) 50 MG tablet Take 1 tablet (50 mg total) by mouth at bedtime as needed for sleep (insomnia). 30 tablet 1   No current facility-administered medications for this visit.    Allergies as of 03/08/2015 - Review Complete 03/08/2015  Allergen Reaction Noted  . Amoxicillin Other (See Comments) 06/17/2013  . Avelox [moxifloxacin hcl in nacl]  12/09/2013  . Hydrocodone Other (See Comments) 06/17/2013  . Moxifloxacin Other (See Comments) 01/14/2012    Family History  Problem Relation Age of Onset  . Lung cancer Mother   . Heart attack Father   . Angina Brother   . Coronary artery disease Brother     Social History   Social History  . Marital Status: Single    Spouse Name: N/A  . Number of Children: N/A  . Years of Education: N/A   Social History Main Topics  . Smoking status: Never Smoker   . Smokeless tobacco: Never Used     Comment: Never smoked much  . Alcohol Use: No  . Drug Use: No  . Sexual Activity: Not Currently   Other Topics Concern  . None   Social History Narrative    Review of Systems: Limited due to cognitive  ability.   Physical Exam: BP 109/43 mmHg  Pulse 75  Temp(Src) 97.1 F (36.2 C)  Ht _0  (1.778 m)  Wt 155 lb (70.308 kg)  BMI 22.24 kg/m2 General:   Alert and oriented. No distress noted. Pleasant and cooperative.  Head:  Normocephalic and atraumatic. Eyes:  Conjuctiva clear without scleral icterus. Mouth:  Oral mucosa pink and moist.  Heart:  S1, S2  present without murmurs, rubs, or gallops. Regular rate and rhythm. Abdomen:  +BS, soft, non-tender and non-distended. PEG tube sit intact Msk:  Kyphosis noted, curled in wheelchair Extremities:  Without edema. Neurologic:  Alert and  oriented x4 Psych:  Alert and cooperative. Normal mood and affect.

## 2015-03-10 ENCOUNTER — Ambulatory Visit (HOSPITAL_COMMUNITY)
Admission: RE | Admit: 2015-03-10 | Discharge: 2015-03-10 | Disposition: A | Discharge status: Home / Self Care | Payer: Medicare Other | Source: Ambulatory Visit | Attending: Internal Medicine | Admitting: Internal Medicine

## 2015-03-10 ENCOUNTER — Encounter (HOSPITAL_COMMUNITY): Payer: Self-pay | Admitting: *Deleted

## 2015-03-10 ENCOUNTER — Encounter (HOSPITAL_COMMUNITY)
Admission: RE | Disposition: A | Discharge status: Home / Self Care | Payer: Self-pay | Source: Ambulatory Visit | Attending: Internal Medicine

## 2015-03-10 DIAGNOSIS — Z431 Encounter for attention to gastrostomy: Secondary | ICD-10-CM | POA: Insufficient documentation

## 2015-03-10 HISTORY — PX: PERCUTANEOUS ENDOSCOPIC GASTROSTOMY (PEG) REMOVAL: SHX6018

## 2015-03-10 SURGERY — REMOVAL, PEG TUBE, ENDOSCOPIC

## 2015-03-10 NOTE — Progress Notes (Signed)
Patient presents to have 20 [place] indwelling PEG tube removed. Has not used it for anything for 6 months. Wants out. This approach along with the risk and benefits and the potential need for a new gastrostomy to be created in the future should his ability to eat diminish again  reviewed with the family at length in the office. All parties agreeable.    Using the traction technique, the weathered, indwelling PEG tube removed intact. Bulky absorptive dressing placed over ostomy site. See discharge instructions. Patient tolerated well.  No apparent complications.

## 2015-03-10 NOTE — Progress Notes (Signed)
cc'ed to pcp °

## 2015-03-10 NOTE — Discharge Instructions (Signed)
Keep bulky absorptive dressing over PEG site until drainage subsides which may take 1-2 weeks.

## 2015-03-10 NOTE — Progress Notes (Signed)
Peg tube pulled out by dr. Gala Romney at 1145 4x4 dressing applied with paper tape

## 2015-03-10 NOTE — Progress Notes (Signed)
Attempted x 2 calls to Caregiver at [facility].  Placed on hold and no one came to phone for report after waiting 5-52mins per call.

## 2015-03-16 ENCOUNTER — Encounter (HOSPITAL_COMMUNITY): Payer: Self-pay | Admitting: Internal Medicine

## 2015-03-24 DIAGNOSIS — E785 Hyperlipidemia, unspecified: Secondary | ICD-10-CM | POA: Diagnosis not present

## 2015-03-24 DIAGNOSIS — E119 Type 2 diabetes mellitus without complications: Secondary | ICD-10-CM | POA: Diagnosis not present

## 2015-03-24 DIAGNOSIS — M6281 Muscle weakness (generalized): Secondary | ICD-10-CM | POA: Diagnosis not present

## 2015-03-24 DIAGNOSIS — S73006A Unspecified dislocation of unspecified hip, initial encounter: Secondary | ICD-10-CM | POA: Diagnosis not present

## 2015-03-24 DIAGNOSIS — D649 Anemia, unspecified: Secondary | ICD-10-CM | POA: Diagnosis not present

## 2015-03-25 DIAGNOSIS — M6281 Muscle weakness (generalized): Secondary | ICD-10-CM | POA: Diagnosis not present

## 2015-03-25 DIAGNOSIS — S73006A Unspecified dislocation of unspecified hip, initial encounter: Secondary | ICD-10-CM | POA: Diagnosis not present

## 2015-03-28 DIAGNOSIS — S73006A Unspecified dislocation of unspecified hip, initial encounter: Secondary | ICD-10-CM | POA: Diagnosis not present

## 2015-03-28 DIAGNOSIS — M6281 Muscle weakness (generalized): Secondary | ICD-10-CM | POA: Diagnosis not present

## 2015-03-29 DIAGNOSIS — S73006A Unspecified dislocation of unspecified hip, initial encounter: Secondary | ICD-10-CM | POA: Diagnosis not present

## 2015-03-29 DIAGNOSIS — M6281 Muscle weakness (generalized): Secondary | ICD-10-CM | POA: Diagnosis not present

## 2015-03-30 DIAGNOSIS — M6281 Muscle weakness (generalized): Secondary | ICD-10-CM | POA: Diagnosis not present

## 2015-03-30 DIAGNOSIS — S73006A Unspecified dislocation of unspecified hip, initial encounter: Secondary | ICD-10-CM | POA: Diagnosis not present

## 2015-03-31 DIAGNOSIS — M6281 Muscle weakness (generalized): Secondary | ICD-10-CM | POA: Diagnosis not present

## 2015-03-31 DIAGNOSIS — S73006A Unspecified dislocation of unspecified hip, initial encounter: Secondary | ICD-10-CM | POA: Diagnosis not present

## 2015-04-01 DIAGNOSIS — M6281 Muscle weakness (generalized): Secondary | ICD-10-CM | POA: Diagnosis not present

## 2015-04-01 DIAGNOSIS — S73006A Unspecified dislocation of unspecified hip, initial encounter: Secondary | ICD-10-CM | POA: Diagnosis not present

## 2015-04-04 DIAGNOSIS — S73006A Unspecified dislocation of unspecified hip, initial encounter: Secondary | ICD-10-CM | POA: Diagnosis not present

## 2015-04-04 DIAGNOSIS — M6281 Muscle weakness (generalized): Secondary | ICD-10-CM | POA: Diagnosis not present

## 2015-04-05 DIAGNOSIS — M6281 Muscle weakness (generalized): Secondary | ICD-10-CM | POA: Diagnosis not present

## 2015-04-05 DIAGNOSIS — M79675 Pain in left toe(s): Secondary | ICD-10-CM | POA: Diagnosis not present

## 2015-04-05 DIAGNOSIS — B351 Tinea unguium: Secondary | ICD-10-CM | POA: Diagnosis not present

## 2015-04-05 DIAGNOSIS — S73006A Unspecified dislocation of unspecified hip, initial encounter: Secondary | ICD-10-CM | POA: Diagnosis not present

## 2015-04-06 DIAGNOSIS — S73006A Unspecified dislocation of unspecified hip, initial encounter: Secondary | ICD-10-CM | POA: Diagnosis not present

## 2015-04-06 DIAGNOSIS — M6281 Muscle weakness (generalized): Secondary | ICD-10-CM | POA: Diagnosis not present

## 2015-04-07 DIAGNOSIS — M6281 Muscle weakness (generalized): Secondary | ICD-10-CM | POA: Diagnosis not present

## 2015-04-07 DIAGNOSIS — S73006A Unspecified dislocation of unspecified hip, initial encounter: Secondary | ICD-10-CM | POA: Diagnosis not present

## 2015-04-08 DIAGNOSIS — M6281 Muscle weakness (generalized): Secondary | ICD-10-CM | POA: Diagnosis not present

## 2015-04-08 DIAGNOSIS — S73006A Unspecified dislocation of unspecified hip, initial encounter: Secondary | ICD-10-CM | POA: Diagnosis not present

## 2015-04-11 DIAGNOSIS — S73006A Unspecified dislocation of unspecified hip, initial encounter: Secondary | ICD-10-CM | POA: Diagnosis not present

## 2015-04-11 DIAGNOSIS — M6281 Muscle weakness (generalized): Secondary | ICD-10-CM | POA: Diagnosis not present

## 2015-04-12 DIAGNOSIS — M6281 Muscle weakness (generalized): Secondary | ICD-10-CM | POA: Diagnosis not present

## 2015-04-12 DIAGNOSIS — S73006A Unspecified dislocation of unspecified hip, initial encounter: Secondary | ICD-10-CM | POA: Diagnosis not present

## 2015-04-13 DIAGNOSIS — S73006A Unspecified dislocation of unspecified hip, initial encounter: Secondary | ICD-10-CM | POA: Diagnosis not present

## 2015-04-13 DIAGNOSIS — M6281 Muscle weakness (generalized): Secondary | ICD-10-CM | POA: Diagnosis not present

## 2015-04-14 DIAGNOSIS — M6281 Muscle weakness (generalized): Secondary | ICD-10-CM | POA: Diagnosis not present

## 2015-04-14 DIAGNOSIS — S73006A Unspecified dislocation of unspecified hip, initial encounter: Secondary | ICD-10-CM | POA: Diagnosis not present

## 2015-04-15 DIAGNOSIS — S73006A Unspecified dislocation of unspecified hip, initial encounter: Secondary | ICD-10-CM | POA: Diagnosis not present

## 2015-04-15 DIAGNOSIS — M6281 Muscle weakness (generalized): Secondary | ICD-10-CM | POA: Diagnosis not present

## 2015-04-18 DIAGNOSIS — M6281 Muscle weakness (generalized): Secondary | ICD-10-CM | POA: Diagnosis not present

## 2015-04-18 DIAGNOSIS — S73006A Unspecified dislocation of unspecified hip, initial encounter: Secondary | ICD-10-CM | POA: Diagnosis not present

## 2015-04-19 DIAGNOSIS — S73006A Unspecified dislocation of unspecified hip, initial encounter: Secondary | ICD-10-CM | POA: Diagnosis not present

## 2015-04-19 DIAGNOSIS — M6281 Muscle weakness (generalized): Secondary | ICD-10-CM | POA: Diagnosis not present

## 2015-04-20 DIAGNOSIS — S73006A Unspecified dislocation of unspecified hip, initial encounter: Secondary | ICD-10-CM | POA: Diagnosis not present

## 2015-04-20 DIAGNOSIS — M6281 Muscle weakness (generalized): Secondary | ICD-10-CM | POA: Diagnosis not present

## 2015-04-20 DIAGNOSIS — R1310 Dysphagia, unspecified: Secondary | ICD-10-CM | POA: Diagnosis not present

## 2015-04-20 DIAGNOSIS — R1311 Dysphagia, oral phase: Secondary | ICD-10-CM | POA: Diagnosis not present

## 2015-04-20 DIAGNOSIS — R262 Difficulty in walking, not elsewhere classified: Secondary | ICD-10-CM | POA: Diagnosis not present

## 2015-04-20 DIAGNOSIS — R1312 Dysphagia, oropharyngeal phase: Secondary | ICD-10-CM | POA: Diagnosis not present

## 2015-04-21 DIAGNOSIS — R262 Difficulty in walking, not elsewhere classified: Secondary | ICD-10-CM | POA: Diagnosis not present

## 2015-04-21 DIAGNOSIS — R1310 Dysphagia, unspecified: Secondary | ICD-10-CM | POA: Diagnosis not present

## 2015-04-21 DIAGNOSIS — R1312 Dysphagia, oropharyngeal phase: Secondary | ICD-10-CM | POA: Diagnosis not present

## 2015-04-21 DIAGNOSIS — S73006A Unspecified dislocation of unspecified hip, initial encounter: Secondary | ICD-10-CM | POA: Diagnosis not present

## 2015-04-21 DIAGNOSIS — R1311 Dysphagia, oral phase: Secondary | ICD-10-CM | POA: Diagnosis not present

## 2015-04-21 DIAGNOSIS — M6281 Muscle weakness (generalized): Secondary | ICD-10-CM | POA: Diagnosis not present

## 2015-04-22 DIAGNOSIS — R1310 Dysphagia, unspecified: Secondary | ICD-10-CM | POA: Diagnosis not present

## 2015-04-22 DIAGNOSIS — M6281 Muscle weakness (generalized): Secondary | ICD-10-CM | POA: Diagnosis not present

## 2015-04-22 DIAGNOSIS — R262 Difficulty in walking, not elsewhere classified: Secondary | ICD-10-CM | POA: Diagnosis not present

## 2015-04-22 DIAGNOSIS — R1312 Dysphagia, oropharyngeal phase: Secondary | ICD-10-CM | POA: Diagnosis not present

## 2015-04-22 DIAGNOSIS — S73006A Unspecified dislocation of unspecified hip, initial encounter: Secondary | ICD-10-CM | POA: Diagnosis not present

## 2015-04-22 DIAGNOSIS — R1311 Dysphagia, oral phase: Secondary | ICD-10-CM | POA: Diagnosis not present

## 2015-04-25 DIAGNOSIS — R1311 Dysphagia, oral phase: Secondary | ICD-10-CM | POA: Diagnosis not present

## 2015-04-25 DIAGNOSIS — E785 Hyperlipidemia, unspecified: Secondary | ICD-10-CM | POA: Diagnosis not present

## 2015-04-25 DIAGNOSIS — E039 Hypothyroidism, unspecified: Secondary | ICD-10-CM | POA: Diagnosis not present

## 2015-04-25 DIAGNOSIS — D649 Anemia, unspecified: Secondary | ICD-10-CM | POA: Diagnosis not present

## 2015-06-20 DIAGNOSIS — R7989 Other specified abnormal findings of blood chemistry: Secondary | ICD-10-CM | POA: Diagnosis not present

## 2015-06-20 DIAGNOSIS — E119 Type 2 diabetes mellitus without complications: Secondary | ICD-10-CM | POA: Diagnosis not present

## 2015-06-20 DIAGNOSIS — E785 Hyperlipidemia, unspecified: Secondary | ICD-10-CM | POA: Diagnosis not present

## 2015-06-20 DIAGNOSIS — Z79899 Other long term (current) drug therapy: Secondary | ICD-10-CM | POA: Diagnosis not present

## 2015-06-20 DIAGNOSIS — M6281 Muscle weakness (generalized): Secondary | ICD-10-CM | POA: Diagnosis not present

## 2015-06-21 DIAGNOSIS — Z7951 Long term (current) use of inhaled steroids: Secondary | ICD-10-CM | POA: Diagnosis not present

## 2015-06-21 DIAGNOSIS — Z961 Presence of intraocular lens: Secondary | ICD-10-CM | POA: Diagnosis not present

## 2015-06-28 DIAGNOSIS — M79675 Pain in left toe(s): Secondary | ICD-10-CM | POA: Diagnosis not present

## 2015-06-28 DIAGNOSIS — B351 Tinea unguium: Secondary | ICD-10-CM | POA: Diagnosis not present

## 2015-09-13 DIAGNOSIS — B351 Tinea unguium: Secondary | ICD-10-CM | POA: Diagnosis not present

## 2015-09-13 DIAGNOSIS — M79675 Pain in left toe(s): Secondary | ICD-10-CM | POA: Diagnosis not present

## 2015-09-19 DIAGNOSIS — E039 Hypothyroidism, unspecified: Secondary | ICD-10-CM | POA: Diagnosis not present

## 2015-09-19 DIAGNOSIS — K219 Gastro-esophageal reflux disease without esophagitis: Secondary | ICD-10-CM | POA: Diagnosis not present

## 2015-09-19 DIAGNOSIS — M6281 Muscle weakness (generalized): Secondary | ICD-10-CM | POA: Diagnosis not present

## 2015-09-19 DIAGNOSIS — E559 Vitamin D deficiency, unspecified: Secondary | ICD-10-CM | POA: Diagnosis not present

## 2015-09-19 DIAGNOSIS — E785 Hyperlipidemia, unspecified: Secondary | ICD-10-CM | POA: Diagnosis not present

## 2015-09-19 DIAGNOSIS — E119 Type 2 diabetes mellitus without complications: Secondary | ICD-10-CM | POA: Diagnosis not present

## 2015-09-19 DIAGNOSIS — D649 Anemia, unspecified: Secondary | ICD-10-CM | POA: Diagnosis not present

## 2015-09-22 DIAGNOSIS — E039 Hypothyroidism, unspecified: Secondary | ICD-10-CM | POA: Diagnosis not present

## 2015-09-22 DIAGNOSIS — L089 Local infection of the skin and subcutaneous tissue, unspecified: Secondary | ICD-10-CM | POA: Diagnosis not present

## 2015-10-16 ENCOUNTER — Encounter (HOSPITAL_COMMUNITY): Payer: Self-pay | Admitting: *Deleted

## 2015-10-16 ENCOUNTER — Emergency Department (HOSPITAL_COMMUNITY): Payer: Medicare Other

## 2015-10-16 ENCOUNTER — Emergency Department (HOSPITAL_COMMUNITY)
Admission: EM | Admit: 2015-10-16 | Discharge: 2015-10-17 | Disposition: A | Discharge status: Home / Self Care | Payer: Medicare Other | Attending: Emergency Medicine | Admitting: Emergency Medicine

## 2015-10-16 DIAGNOSIS — M542 Cervicalgia: Secondary | ICD-10-CM | POA: Diagnosis not present

## 2015-10-16 DIAGNOSIS — Y92129 Unspecified place in nursing home as the place of occurrence of the external cause: Secondary | ICD-10-CM | POA: Diagnosis not present

## 2015-10-16 DIAGNOSIS — E039 Hypothyroidism, unspecified: Secondary | ICD-10-CM | POA: Diagnosis not present

## 2015-10-16 DIAGNOSIS — S42034A Nondisplaced fracture of lateral end of right clavicle, initial encounter for closed fracture: Secondary | ICD-10-CM | POA: Diagnosis not present

## 2015-10-16 DIAGNOSIS — S42001A Fracture of unspecified part of right clavicle, initial encounter for closed fracture: Secondary | ICD-10-CM

## 2015-10-16 DIAGNOSIS — G4489 Other headache syndrome: Secondary | ICD-10-CM | POA: Diagnosis not present

## 2015-10-16 DIAGNOSIS — Y939 Activity, unspecified: Secondary | ICD-10-CM | POA: Diagnosis not present

## 2015-10-16 DIAGNOSIS — W06XXXA Fall from bed, initial encounter: Secondary | ICD-10-CM | POA: Insufficient documentation

## 2015-10-16 DIAGNOSIS — S4991XA Unspecified injury of right shoulder and upper arm, initial encounter: Secondary | ICD-10-CM | POA: Diagnosis present

## 2015-10-16 DIAGNOSIS — W19XXXA Unspecified fall, initial encounter: Secondary | ICD-10-CM

## 2015-10-16 DIAGNOSIS — Z7982 Long term (current) use of aspirin: Secondary | ICD-10-CM | POA: Diagnosis not present

## 2015-10-16 DIAGNOSIS — Y999 Unspecified external cause status: Secondary | ICD-10-CM | POA: Insufficient documentation

## 2015-10-16 DIAGNOSIS — S42031A Displaced fracture of lateral end of right clavicle, initial encounter for closed fracture: Secondary | ICD-10-CM | POA: Insufficient documentation

## 2015-10-16 DIAGNOSIS — S0990XA Unspecified injury of head, initial encounter: Secondary | ICD-10-CM | POA: Insufficient documentation

## 2015-10-16 DIAGNOSIS — F4489 Other dissociative and conversion disorders: Secondary | ICD-10-CM | POA: Diagnosis not present

## 2015-10-16 DIAGNOSIS — S199XXA Unspecified injury of neck, initial encounter: Secondary | ICD-10-CM | POA: Diagnosis not present

## 2015-10-16 DIAGNOSIS — Z79899 Other long term (current) drug therapy: Secondary | ICD-10-CM | POA: Diagnosis not present

## 2015-10-16 DIAGNOSIS — T148 Other injury of unspecified body region: Secondary | ICD-10-CM | POA: Diagnosis not present

## 2015-10-16 DIAGNOSIS — M25511 Pain in right shoulder: Secondary | ICD-10-CM | POA: Diagnosis not present

## 2015-10-16 MED ORDER — MORPHINE SULFATE (PF) 4 MG/ML IV SOLN
4.0000 mg | Freq: Once | INTRAVENOUS | Status: AC
  Administered 2015-10-16: 4 mg via INTRAMUSCULAR
  Filled 2015-10-16: qty 1

## 2015-10-16 NOTE — ED Triage Notes (Signed)
Pt is a resident of [facility] who was found laying in floor after falling from the bed, c/o pain to the back of the head and right shoulder, c-collar in place upon arrival to er.

## 2015-10-16 NOTE — ED Notes (Signed)
Pt fell out of bed tonight, reports right shoulder pain and neck pain. No obvious deformity noted to shoulder. Pt has shorten and internal rotation of left leg; per family pt has hip dislocation from 2 years ago. At baseline per family.

## 2015-10-16 NOTE — ED Provider Notes (Signed)
[facility] DEPT Provider Note   CSN: [id number] Arrival date & time: 10/16/15  2211  First Provider Contact:  23:28 PM   By signing my name below, I, Altamease Oiler, attest that this documentation has been prepared under the direction and in the presence of Rolland Porter, MD. Electronically Signed: Altamease Oiler, ED Scribe. 10/17/15. 12:04 AM   History   Chief Complaint Chief Complaint  Patient presents with  . Fall   Level 5 Caveat for being mentally challenged  HPI  The history is provided by the patient and a relative. No language interpreter was used.   Alexander Hanna is a 75 y.o. 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 The pt rolled out of bed to the floor. He struck his head on the floor in the fall. Associated symptoms include right shoulder pain and neck pain. Pt denies headache. Patient has a chronically dislocated right hip.   The pt does not smoke.    PCP  Dr. Maudie Mercury at his facility.  Past Medical History:  Diagnosis Date  . Environmental allergies   . Hypothyroidism   . 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 Overactive bladder   . Polio    as a child    Patient Active Problem List   Diagnosis Date Noted  . S/P percutaneous endoscopic gastrostomy (PEG) tube placement ([place]) 03/08/2015  . Protein-calorie malnutrition ([place]) 01/06/2014  . Dysphagia, unspecified(787.20) 12/11/2013  . Unspecified protein-calorie malnutrition ([place]) 12/11/2013  . Rectal bleeding 12/18/2012  . Unspecified constipation 12/18/2012  . Leukocytosis, unspecified 12/07/2012  . Ambulatory dysfunction 11/22/2012  . Fall 11/22/2012  . Frequent falls 11/22/2012  . Compression fracture of L1 lumbar vertebra ([place]) 11/22/2012  . Unspecified hypothyroidism 11/22/2012  . Difficulty in walking(719.7) 09/28/2010  . Stiffness of joint, not elsewhere  classified, pelvic region and thigh 09/28/2010  . KNEE, ARTHRITIS, DEGEN./OSTEO 05/31/2009  . ARTHRITIS, RIGHT HIP 05/31/2009  . SPINAL STENOSIS 05/31/2009  . OSTEOARTHRITIS, HIP, RIGHT 06/17/2008  . HIP PAIN 06/17/2008    Past Surgical History:  Procedure Laterality Date  . CATARACT EXTRACTION W/PHACO  12/20/2011   Procedure: CATARACT EXTRACTION PHACO AND INTRAOCULAR LENS PLACEMENT (IOC);  Surgeon: Tonny Branch, MD;  Location: [facility];  Service: Ophthalmology;  Laterality: Left;  CDE: 14.80  . CATARACT EXTRACTION W/PHACO  01/14/2012   Procedure: CATARACT EXTRACTION PHACO AND INTRAOCULAR LENS PLACEMENT (IOC);  Surgeon: Tonny Branch, MD;  Location: [facility];  Service: Ophthalmology;  Laterality: Right;  CDE 19.57  . CHOLECYSTECTOMY    . ESOPHAGOGASTRODUODENOSCOPY N/A 01/14/2014   Dr. Rourk:non-critical schatzki ring, not manipulated/small HHotherwise normal. 20 F PEG tube placed   . JOINT REPLACEMENT     rt anterior hip  . JOINT REPLACEMENT     Left hip  . PEG PLACEMENT N/A 01/14/2014   Procedure: PERCUTANEOUS ENDOSCOPIC GASTROSTOMY (PEG) PLACEMENT;  Surgeon: Daneil Dolin, MD;  Location: [facility];  Service: Endoscopy;  Laterality: N/A;  . PERCUTANEOUS ENDOSCOPIC GASTROSTOMY (PEG) REMOVAL N/A 03/10/2015   Procedure: PERCUTANEOUS ENDOSCOPIC GASTROSTOMY (PEG) REMOVAL;  Surgeon: Daneil Dolin, MD;  Location: [facility];  Service: Endoscopy;  Laterality: N/A;  1115  . WOUND DEBRIDEMENT Left    Post hip replacement       Home Medications    Prior to Admission medications   Medication Sig Start Date  End Date Taking? Authorizing Provider  acetaminophen (TYLENOL) 325 MG tablet Take 650 mg by mouth 3 (three) times daily.    Historical Provider, MD  acidophilus (RISAQUAD) CAPS capsule Take 1 capsule by mouth 2 (two) times daily.    Historical Provider, MD  Amino Acids-Protein Hydrolys (FEEDING SUPPLEMENT, PRO-STAT SUGAR FREE 64,) LIQD Take 30 mLs by mouth 2 (two) times daily.     Historical Provider, MD  ascorbic acid (VITAMIN C) 500 MG tablet Take 500 mg by mouth daily.    Historical Provider, MD  aspirin EC 81 MG tablet Take 81 mg by mouth daily.    Historical Provider, MD  atorvastatin (LIPITOR) 10 MG tablet Take 10 mg by mouth daily at 6 PM.  01/24/15   Historical Provider, MD  docusate sodium (COLACE) 100 MG capsule Take 100 mg by mouth 2 (two) times daily.    Historical Provider, MD  dutasteride (AVODART) 0.5 MG capsule Take 0.5 mg by mouth daily.    Historical Provider, MD  ezetimibe (ZETIA) 10 MG tablet Take 10 mg by mouth daily.    Historical Provider, MD  ferrous sulfate 325 (65 FE) MG tablet Take 325 mg by mouth 2 (two) times daily.     Historical Provider, MD  finasteride (PROSCAR) 5 MG tablet Take 5 mg by mouth daily.  02/10/15   Historical Provider, MD  fluticasone (FLONASE) 50 MCG/ACT nasal spray Place 2 sprays into the nose daily. Allergies    Historical Provider, MD  guaiFENesin (MUCINEX) 600 MG 12 hr tablet Take by mouth 2 (two) times daily.    Historical Provider, MD  HYDROcodone-acetaminophen (NORCO/VICODIN) 5-325 MG per tablet Take 1 tablet by mouth every 6 (six) hours as needed for moderate pain. Takes 2 tablets every 6 hours    Historical Provider, MD  levothyroxine (SYNTHROID, LEVOTHROID) 175 MCG tablet Take 175 mcg by mouth daily before breakfast.  02/05/15   Historical Provider, MD  Multiple Vitamin (MULTIVITAMIN) tablet Take 1 tablet by mouth daily.    Historical Provider, MD  niacin (NIASPAN) 500 MG CR tablet Take 500 mg by mouth daily.  02/24/15   Historical Provider, MD  niacin-simvastatin (SIMCOR) 500-20 MG 24 hr tablet Take 1 tablet by mouth at bedtime.    Historical Provider, MD  Nutritional Supplements (RESOURCE 2.0) LIQD Take 120 mLs by mouth 2 (two) times daily.    Historical Provider, MD  omeprazole (PRILOSEC) 20 MG capsule Take 20 mg by mouth daily.  02/12/15   Historical Provider, MD  pantoprazole (PROTONIX) 40 MG tablet Take 40 mg by  mouth daily.    Historical Provider, MD  polyethylene glycol (MIRALAX / GLYCOLAX) packet Take 17 g by mouth daily.    Historical Provider, MD  tamsulosin (FLOMAX) 0.4 MG CAPS capsule Take 0.4 mg by mouth daily.     Historical Provider, MD  traMADol (ULTRAM) 50 MG tablet Take 50 mg by mouth every 6 (six) hours as needed for moderate pain.  02/26/15   Historical Provider, MD  traZODone (DESYREL) 50 MG tablet Take 1 tablet (50 mg total) by mouth at bedtime as needed for sleep (insomnia). 11/25/12   Lucia Gaskins, MD    Family History Family History  Problem Relation Age of Onset  . Lung cancer Mother   . Heart attack Father   . Angina Brother   . Coronary artery disease Brother     Social History Social History  Substance Use Topics  . Smoking status: Never Smoker  . Smokeless tobacco: Never  Used     Comment: Never smoked much  . Alcohol use No  living in a nursing home x 2 years Bed ridden b/o chronic right hip dislocation   Allergies   Amoxicillin; Avelox [moxifloxacin hcl in nacl]; Hydrocodone; and Moxifloxacin   Review of Systems Review of Systems  Musculoskeletal: Positive for arthralgias and neck pain.  Neurological: Negative for headaches.  All other systems reviewed and are negative.    Physical Exam Updated Vital Signs BP 111/76   Pulse 66   Temp 97.7 F (36.5 C) (Temporal)   Resp 18   SpO2 95%   Physical Exam  Constitutional: He is oriented to person, place, and time.  Non-toxic appearance. He does not appear ill. He appears distressed.  Frail, elderly man who is hard of hearing  HENT:  Head: Normocephalic and atraumatic.  Right Ear: External ear normal.  Left Ear: External ear normal.  Nose: Nose normal. No mucosal edema or rhinorrhea.  Mouth/Throat: Oropharynx is clear and moist and mucous membranes are normal. No dental abscesses or uvula swelling.  Eyes: Conjunctivae and EOM are normal. Pupils are equal, round, and reactive to light.  Neck: Full  passive range of motion without pain.  Cervical collar was loosened and he had no pain to direct palpation of cervical pain but on ROM he states that he had some pain so the cervical collar was reapplied.   Cardiovascular: Normal rate, regular rhythm and normal heart sounds.  Exam reveals no gallop and no friction rub.   No murmur heard. Pulmonary/Chest: Effort normal and breath sounds normal. No respiratory distress. He has no wheezes. He has no rhonchi. He has no rales. He exhibits no tenderness and no crepitus.  Abdominal: Soft. Normal appearance and bowel sounds are normal. He exhibits no distension. There is no tenderness. There is no rebound and no guarding.  Musculoskeletal: He exhibits no edema or tenderness.  Right shoulder appears different from left and he has very limited ROM  Neurological: He is alert and oriented to person, place, and time. He has normal strength. No cranial nerve deficit.  Skin: Skin is warm, dry and intact. No rash noted. No erythema. No pallor.  Psychiatric: He has a normal mood and affect. His speech is normal and behavior is normal. His mood appears not anxious.  Nursing note and vitals reviewed.    ED Treatments / Results   Radiology Dg Shoulder Right  Result Date: 10/17/2015 CLINICAL DATA:  Fall out of bed at nursing home. Right shoulder pain. EXAM: RIGHT SHOULDER - 2+ VIEW COMPARISON:  None. FINDINGS: Suspect nondisplaced distal clavicle fracture. This is suboptimally assessed due to patient positioning. Acromioclavicular and coracoclavicular joint spaces are maintained. Glenohumeral alignment is suboptimally assessed. Remote right rib fracture. IMPRESSION: Exam limited due to positioning. Suspect nondisplaced distal clavicle fracture. Electronically Signed   By: Jeb Levering M.D.   On: 10/17/2015 00:42  Ct Head Wo Contrast  Ct Cervical Spine Wo Contrast  Result Date: 10/17/2015 CLINICAL DATA:  Fall out of bed at nursing home. Now with neck pain.  EXAM: CT HEAD WITHOUT CONTRAST CT CERVICAL SPINE WITHOUT CONTRAST TECHNIQUE: Multidetector CT imaging of the head and cervical spine was performed following the standard protocol without intravenous contrast. Multiplanar CT image reconstructions of the cervical spine were also generated. COMPARISON:  CT 11/22/2012 FINDINGS: CT HEAD FINDINGS Motion limited evaluation. Allowing for this, no evidence of acute intracranial hemorrhage. Mild generalized atrophy and chronic small vessel ischemia. No findings of acute ischemia  or territorial infarct. No extra-axial fluid collection. No displaced calvarial fracture. Paranasal sinuses and mastoid air cells grossly normal. CT CERVICAL SPINE FINDINGS Examination repeated for motion, despite repeated scanning moderate motion persisted. Allowing for motion limitations, no evidence of acute fracture or subluxation. Dens is intact. No jumped or perched facets. Multilevel facet arthropathy. Chronic calcification of the posterior interspinous ligament. No prevertebral soft tissue edema. Mild chronic change at the lung apices. IMPRESSION: Allowing for motion limited exams, no evidence of acute abnormality in the brain or cervical spine. Electronically Signed   By: Jeb Levering M.D.   On: 10/17/2015 00:48   Procedures Procedures (including critical care time)  Medications Ordered in ED Medications  morphine 4 MG/ML injection 4 mg (4 mg Intramuscular Given 10/16/15 2342)     Initial Impression / Assessment and Plan / ED Course  I have reviewed the triage vital signs and the nursing notes.  Pertinent imaging results that were available during my care of the patient were reviewed by me and considered in my medical decision making (see chart for details).  Clinical Course   COORDINATION OF CARE: 11:34 PM Discussed treatment plan which includes CT head, CT cervical spine, right shoulder XR, and pain medication with pt and family at bedside and they agreed to  plan.  02:00 AM Discussed his test results with his brother. Patient was placed in a sling. Patient already has hydrocodone as needed for pain written for at the nursing home. His brother states he normally does gets tramadol twice a day.  [employer] has no entries  Final Clinical Impressions(s) / ED Diagnoses   Final diagnoses:  Fall at nursing home, initial encounter  Clavicle fracture, right, closed, initial encounter  Neck pain    Plan discharge   Rolland Porter, MD, FACEP  I personally performed the services described in this documentation, which was scribed in my presence. The recorded information has been reviewed and considered.  Rolland Porter, MD, Barbette Or, MD 10/17/15 [id number]

## 2015-10-16 NOTE — ED Notes (Signed)
Patient transported to CT 

## 2015-10-17 ENCOUNTER — Emergency Department (HOSPITAL_COMMUNITY): Payer: Medicare Other

## 2015-10-17 DIAGNOSIS — S199XXA Unspecified injury of neck, initial encounter: Secondary | ICD-10-CM | POA: Diagnosis not present

## 2015-10-17 DIAGNOSIS — Z7401 Bed confinement status: Secondary | ICD-10-CM | POA: Diagnosis not present

## 2015-10-17 DIAGNOSIS — S0090XA Unspecified superficial injury of unspecified part of head, initial encounter: Secondary | ICD-10-CM | POA: Diagnosis not present

## 2015-10-17 DIAGNOSIS — M542 Cervicalgia: Secondary | ICD-10-CM | POA: Diagnosis not present

## 2015-10-17 DIAGNOSIS — M25511 Pain in right shoulder: Secondary | ICD-10-CM | POA: Diagnosis not present

## 2015-10-17 DIAGNOSIS — S42031A Displaced fracture of lateral end of right clavicle, initial encounter for closed fracture: Secondary | ICD-10-CM | POA: Diagnosis not present

## 2015-10-17 DIAGNOSIS — S4991XA Unspecified injury of right shoulder and upper arm, initial encounter: Secondary | ICD-10-CM | POA: Diagnosis not present

## 2015-10-17 DIAGNOSIS — R279 Unspecified lack of coordination: Secondary | ICD-10-CM | POA: Diagnosis not present

## 2015-10-17 DIAGNOSIS — L089 Local infection of the skin and subcutaneous tissue, unspecified: Secondary | ICD-10-CM | POA: Diagnosis not present

## 2015-10-17 DIAGNOSIS — S0990XA Unspecified injury of head, initial encounter: Secondary | ICD-10-CM | POA: Diagnosis not present

## 2015-10-17 DIAGNOSIS — S42009A Fracture of unspecified part of unspecified clavicle, initial encounter for closed fracture: Secondary | ICD-10-CM | POA: Diagnosis not present

## 2015-10-17 MED ORDER — OXYCODONE-ACETAMINOPHEN 5-325 MG PO TABS: 1.0000 | ORAL_TABLET | Freq: Four times a day (QID) | ORAL | Status: DC | PRN

## 2015-10-17 MED ORDER — OXYCODONE-ACETAMINOPHEN 5-325 MG PO TABS: 1.0000 | ORAL_TABLET | Freq: Four times a day (QID) | ORAL | 0 refills | Status: DC | PRN

## 2015-10-17 NOTE — ED Notes (Signed)
Spoke with April at [facility]. Discharge orders say for patient to take Hydrocodone which he has showing on his MAR for pain. April explained that that order expired earlier this year, she would have to get a new order when the PCP made rounds this afternoon. I informed Dr. Tomi Bamberger of this

## 2015-10-17 NOTE — Discharge Instructions (Signed)
His x-ray shows an old rib fracture that has healed, and a probable fracture of his distal clavicle. He can wear the sling for comfort. He can have the hydrocodone that he was already has written as needed for pain. Dr. Maudie Mercury can decide if he wants him to follow-up with an orthopedist. Return to the emergency department for any problems listed on the head injury sheet.

## 2015-10-17 NOTE — ED Notes (Signed)
Prepack of Oxycodone 6 tablets given to Alexander Hanna, EMT with Rip Harbour, RN as witness. Informed Janett Billow that nurse April is expecting them.

## 2015-10-20 DIAGNOSIS — S42009A Fracture of unspecified part of unspecified clavicle, initial encounter for closed fracture: Secondary | ICD-10-CM | POA: Diagnosis not present

## 2015-10-20 DIAGNOSIS — M79601 Pain in right arm: Secondary | ICD-10-CM | POA: Diagnosis not present

## 2015-10-20 DIAGNOSIS — E039 Hypothyroidism, unspecified: Secondary | ICD-10-CM | POA: Diagnosis not present

## 2015-10-28 MED FILL — Oxycodone w/ Acetaminophen Tab 5-325 MG: ORAL | Qty: 6 | Status: AC

## 2015-10-31 DIAGNOSIS — S42009A Fracture of unspecified part of unspecified clavicle, initial encounter for closed fracture: Secondary | ICD-10-CM | POA: Diagnosis not present

## 2015-10-31 DIAGNOSIS — L02419 Cutaneous abscess of limb, unspecified: Secondary | ICD-10-CM | POA: Diagnosis not present

## 2015-10-31 DIAGNOSIS — E039 Hypothyroidism, unspecified: Secondary | ICD-10-CM | POA: Diagnosis not present

## 2015-10-31 DIAGNOSIS — M79601 Pain in right arm: Secondary | ICD-10-CM | POA: Diagnosis not present

## 2015-11-01 DIAGNOSIS — M6281 Muscle weakness (generalized): Secondary | ICD-10-CM | POA: Diagnosis not present

## 2015-11-01 DIAGNOSIS — M2550 Pain in unspecified joint: Secondary | ICD-10-CM | POA: Diagnosis not present

## 2015-11-01 DIAGNOSIS — N390 Urinary tract infection, site not specified: Secondary | ICD-10-CM | POA: Diagnosis not present

## 2015-11-03 ENCOUNTER — Ambulatory Visit (INDEPENDENT_AMBULATORY_CARE_PROVIDER_SITE_OTHER): Payer: Medicare Other

## 2015-11-03 ENCOUNTER — Encounter: Payer: Self-pay | Admitting: Orthopaedic Surgery

## 2015-11-03 ENCOUNTER — Ambulatory Visit (INDEPENDENT_AMBULATORY_CARE_PROVIDER_SITE_OTHER): Payer: Medicare Other | Admitting: Orthopaedic Surgery

## 2015-11-03 VITALS — BP 95/55 | HR 76 | Temp 97.5°F

## 2015-11-03 DIAGNOSIS — L02419 Cutaneous abscess of limb, unspecified: Secondary | ICD-10-CM | POA: Diagnosis not present

## 2015-11-03 DIAGNOSIS — S42001A Fracture of unspecified part of right clavicle, initial encounter for closed fracture: Secondary | ICD-10-CM

## 2015-11-03 DIAGNOSIS — B9562 Methicillin resistant Staphylococcus aureus infection as the cause of diseases classified elsewhere: Secondary | ICD-10-CM | POA: Diagnosis not present

## 2015-11-03 DIAGNOSIS — E039 Hypothyroidism, unspecified: Secondary | ICD-10-CM | POA: Diagnosis not present

## 2015-11-03 DIAGNOSIS — M79601 Pain in right arm: Secondary | ICD-10-CM | POA: Diagnosis not present

## 2015-11-03 NOTE — Progress Notes (Signed)
Subjective: my shoulder hurts    Patient ID: Alexander Hanna, male    DOB: 09-13-1940, 75 y.o.   MRN: [id number]  HPI He is a resident at [facility].  He slid out of bed on July 31,2017 and sustained a nondisplaced fracture of the right clavicle.  He was evaluated in the ER then and had multiple studies.  He has been using a sling at the nursing home.  He is bed ridden and has contracture of the lower extremities.  He is in a wheelchair.  He has no new acute injury.  His family member accompanies him today.  He is not having pain now.   Review of Systems  HENT: Negative for congestion.   Respiratory: Positive for shortness of breath. Negative for cough.   Cardiovascular: Positive for leg swelling. Negative for chest pain.  Endocrine: Positive for cold intolerance.  Musculoskeletal: Positive for arthralgias, back pain, gait problem and myalgias.  Allergic/Immunologic: Positive for environmental allergies.   Past Medical History:  Diagnosis Date  . Environmental allergies   . Hypothyroidism   . 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 Overactive bladder   . Polio    as a child    Past Surgical History:  Procedure Laterality Date  . CATARACT EXTRACTION W/PHACO  12/20/2011   Procedure: CATARACT EXTRACTION PHACO AND INTRAOCULAR LENS PLACEMENT (IOC);  Surgeon: Tonny Branch, MD;  Location: [facility];  Service: Ophthalmology;  Laterality: Left;  CDE: 14.80  . CATARACT EXTRACTION W/PHACO  01/14/2012   Procedure: CATARACT EXTRACTION PHACO AND INTRAOCULAR LENS PLACEMENT (IOC);  Surgeon: Tonny Branch, MD;  Location: [facility];  Service: Ophthalmology;  Laterality: Right;  CDE 19.57  . CHOLECYSTECTOMY    . ESOPHAGOGASTRODUODENOSCOPY N/A 01/14/2014   Dr. Rourk:non-critical schatzki ring, not manipulated/small HHotherwise normal. 20 F PEG tube placed   . JOINT REPLACEMENT     rt anterior hip  . JOINT REPLACEMENT     Left hip  . PEG PLACEMENT N/A 01/14/2014   Procedure:  PERCUTANEOUS ENDOSCOPIC GASTROSTOMY (PEG) PLACEMENT;  Surgeon: Daneil Dolin, MD;  [facility];  Service: Endoscopy;  Laterality: N/A;  . PERCUTANEOUS ENDOSCOPIC GASTROSTOMY (PEG) REMOVAL N/A 03/10/2015   Procedure: PERCUTANEOUS ENDOSCOPIC GASTROSTOMY (PEG) REMOVAL;  Surgeon: Daneil Dolin, MD;  [facility];  Service: Endoscopy;  Laterality: N/A;  1115  . WOUND DEBRIDEMENT Left    Post hip replacement    Current Outpatient Prescriptions on File Prior to Visit  Medication Sig Dispense Refill  . acetaminophen (TYLENOL) 325 MG tablet Take 650 mg by mouth 3 (three) times daily.    Marland Kitchen acidophilus (RISAQUAD) CAPS capsule Take 1 capsule by mouth 2 (two) times daily.    . Amino Acids-Protein Hydrolys (FEEDING SUPPLEMENT, PRO-STAT SUGAR FREE 64,) LIQD Take 30 mLs by mouth 2 (two) times daily.    Marland Kitchen ascorbic acid (VITAMIN C) 500 MG tablet Take 500 mg by mouth daily.    Marland Kitchen aspirin EC 81 MG tablet Take 81 mg by mouth daily.    Marland Kitchen atorvastatin (LIPITOR) 10 MG tablet Take 10 mg by mouth daily at 6 PM.     . docusate sodium (COLACE) 100 MG capsule Take 100 mg by mouth 2 (two) times daily.    Marland Kitchen dutasteride (AVODART) 0.5 MG capsule Take 0.5 mg by mouth daily.    Marland Kitchen ezetimibe (ZETIA) 10 MG tablet Take 10 mg by mouth daily.    . ferrous sulfate  325 (65 FE) MG tablet Take 325 mg by mouth 2 (two) times daily.     . finasteride (PROSCAR) 5 MG tablet Take 5 mg by mouth daily.     Marland Kitchen guaiFENesin (MUCINEX) 600 MG 12 hr tablet Take by mouth 2 (two) times daily.    Marland Kitchen levothyroxine (SYNTHROID, LEVOTHROID) 175 MCG tablet Take 150 mcg by mouth daily before breakfast.     . Multiple Vitamin (MULTIVITAMIN) tablet Take 1 tablet by mouth daily.    . niacin (NIASPAN) 500 MG CR tablet Take 500 mg by mouth daily.     . niacin-simvastatin (SIMCOR) 500-20 MG 24 hr tablet Take 1 tablet by mouth at bedtime.    . Nutritional Supplements (RESOURCE 2.0) LIQD Take 120 mLs by mouth 2 (two) times daily.    Marland Kitchen  omeprazole (PRILOSEC) 20 MG capsule Take 20 mg by mouth daily.     . pantoprazole (PROTONIX) 40 MG tablet Take 40 mg by mouth daily.    . polyethylene glycol (MIRALAX / GLYCOLAX) packet Take 17 g by mouth daily.    . tamsulosin (FLOMAX) 0.4 MG CAPS capsule Take 0.4 mg by mouth daily.     . traMADol (ULTRAM) 50 MG tablet Take 50 mg by mouth every 6 (six) hours as needed for moderate pain.     . traZODone (DESYREL) 50 MG tablet Take 1 tablet (50 mg total) by mouth at bedtime as needed for sleep (insomnia). 30 tablet 1  . fluticasone (FLONASE) 50 MCG/ACT nasal spray Place 2 sprays into the nose daily. Allergies    . HYDROcodone-acetaminophen (NORCO/VICODIN) 5-325 MG per tablet Take 1 tablet by mouth every 6 (six) hours as needed for moderate pain. Takes 2 tablets every 6 hours    . oxyCODONE-acetaminophen (PERCOCET/ROXICET) 5-325 MG tablet Take 1 tablet by mouth every 6 (six) hours as needed for severe pain. (Patient not taking: Reported on 11/03/2015) 6 tablet 0   No current facility-administered medications on file prior to visit.     Social History   Social History  . Marital status: Single    Spouse name: N/A  . Number of children: N/A  . Years of education: N/A   Occupational History  . Not on file.   Social History Main Topics  . Smoking status: Never Smoker  . Smokeless tobacco: Never Used     Comment: Never smoked much  . Alcohol use No  . Drug use: No  . Sexual activity: Not Currently   Other Topics Concern  . Not on file   Social History Narrative  . No narrative on file    Family History  Problem Relation Age of Onset  . Lung cancer Mother   . Heart attack Father   . Angina Brother   . Coronary artery disease Brother     BP (!) 95/55   Pulse 76   Temp 97.5 F (36.4 C)      Objective:   Physical Exam  Constitutional: He is oriented to person, place, and time. He appears well-developed and well-nourished.  HENT:  Head: Normocephalic and atraumatic.  Has  hearing aid.  Hard of hearing.  Eyes: Conjunctivae and EOM are normal. Pupils are equal, round, and reactive to light.  Neck: Normal range of motion. Neck supple.  Cardiovascular: Normal rate, regular rhythm and intact distal pulses.   Pulmonary/Chest: Effort normal.  Abdominal: Soft.  Musculoskeletal: He exhibits tenderness (Slight pain of the right shoulder.  ROM limited.  contractures of the lower extremities long  standing.).  Confined to wheelchair.  Neurological: He is alert and oriented to person, place, and time. He has normal reflexes. He displays normal reflexes. No cranial nerve deficit. He exhibits abnormal muscle tone. Coordination abnormal.  Skin: Skin is warm and dry.  Psychiatric: He has a normal mood and affect. His behavior is normal. Judgment and thought content normal.    X-rays of the right shoulder were done, reported separately.      Assessment & Plan:   Encounter Diagnosis  Name Primary?  . Right clavicle fracture, closed, initial encounter Yes   He can come out of the sling at the nursing home.  Forms completed for the nursing home.  Return in three weeks.  X-rays of the right clavicle on return.  Call if any problem.  Precautions discussed.  Electronically Signed Sanjuana Kava, MD 8/17/20173:16 PM

## 2015-11-14 DIAGNOSIS — M2550 Pain in unspecified joint: Secondary | ICD-10-CM | POA: Diagnosis not present

## 2015-11-14 DIAGNOSIS — M6281 Muscle weakness (generalized): Secondary | ICD-10-CM | POA: Diagnosis not present

## 2015-11-21 DIAGNOSIS — M79601 Pain in right arm: Secondary | ICD-10-CM | POA: Diagnosis not present

## 2015-11-21 DIAGNOSIS — S42009A Fracture of unspecified part of unspecified clavicle, initial encounter for closed fracture: Secondary | ICD-10-CM | POA: Diagnosis not present

## 2015-11-21 DIAGNOSIS — B9562 Methicillin resistant Staphylococcus aureus infection as the cause of diseases classified elsewhere: Secondary | ICD-10-CM | POA: Diagnosis not present

## 2015-11-21 DIAGNOSIS — L02419 Cutaneous abscess of limb, unspecified: Secondary | ICD-10-CM | POA: Diagnosis not present

## 2015-11-24 ENCOUNTER — Ambulatory Visit (INDEPENDENT_AMBULATORY_CARE_PROVIDER_SITE_OTHER): Payer: Medicare Other | Admitting: Orthopaedic Surgery

## 2015-11-24 ENCOUNTER — Encounter: Payer: Self-pay | Admitting: Orthopaedic Surgery

## 2015-11-24 ENCOUNTER — Ambulatory Visit (INDEPENDENT_AMBULATORY_CARE_PROVIDER_SITE_OTHER): Payer: Medicare Other

## 2015-11-24 VITALS — BP 103/64 | HR 79 | Temp 97.2°F

## 2015-11-24 DIAGNOSIS — S42001D Fracture of unspecified part of right clavicle, subsequent encounter for fracture with routine healing: Secondary | ICD-10-CM

## 2015-11-24 NOTE — Progress Notes (Signed)
CC:  My shoulder does not hurt much  He has fracture of the right distal clavicle.  He has little pain.  NV intact.  X-rays done and reported separately.  Encounter Diagnosis  Name Primary?  . Clavicle fracture, right, with routine healing, subsequent encounter Yes   Return in three weeks for final visit.  X-rays on return.  Call if any problem  Precautions discussed.  Electronically Signed Sanjuana Kava, MD 9/7/20172:38 PM

## 2015-12-06 DIAGNOSIS — B351 Tinea unguium: Secondary | ICD-10-CM | POA: Diagnosis not present

## 2015-12-06 DIAGNOSIS — M79675 Pain in left toe(s): Secondary | ICD-10-CM | POA: Diagnosis not present

## 2015-12-13 DIAGNOSIS — R69 Illness, unspecified: Secondary | ICD-10-CM | POA: Diagnosis not present

## 2015-12-13 DIAGNOSIS — Z79899 Other long term (current) drug therapy: Secondary | ICD-10-CM | POA: Diagnosis not present

## 2015-12-15 ENCOUNTER — Ambulatory Visit (INDEPENDENT_AMBULATORY_CARE_PROVIDER_SITE_OTHER): Payer: Medicare Other

## 2015-12-15 ENCOUNTER — Ambulatory Visit (INDEPENDENT_AMBULATORY_CARE_PROVIDER_SITE_OTHER): Payer: Medicare Other | Admitting: Orthopaedic Surgery

## 2015-12-15 DIAGNOSIS — S42001D Fracture of unspecified part of right clavicle, subsequent encounter for fracture with routine healing: Secondary | ICD-10-CM

## 2015-12-15 DIAGNOSIS — L089 Local infection of the skin and subcutaneous tissue, unspecified: Secondary | ICD-10-CM | POA: Diagnosis not present

## 2015-12-15 DIAGNOSIS — L02419 Cutaneous abscess of limb, unspecified: Secondary | ICD-10-CM | POA: Diagnosis not present

## 2015-12-15 DIAGNOSIS — B9562 Methicillin resistant Staphylococcus aureus infection as the cause of diseases classified elsewhere: Secondary | ICD-10-CM | POA: Diagnosis not present

## 2015-12-15 NOTE — Progress Notes (Signed)
CC:  I am OK  He has no pain with the right shoulder  He has full motion of the right shoulder.  NV intact.  Encounter Diagnosis  Name Primary?  . Clavicle fracture, right, with routine healing, subsequent encounter Yes   Discharge.  Forms completed for nursing home.  Electronically Signed Sanjuana Kava, MD 9/28/20172:09 PM

## 2015-12-19 DIAGNOSIS — T84021A Dislocation of internal left hip prosthesis, initial encounter: Secondary | ICD-10-CM | POA: Diagnosis not present

## 2015-12-19 DIAGNOSIS — B9562 Methicillin resistant Staphylococcus aureus infection as the cause of diseases classified elsewhere: Secondary | ICD-10-CM | POA: Diagnosis not present

## 2015-12-19 DIAGNOSIS — T8130XA Disruption of wound, unspecified, initial encounter: Secondary | ICD-10-CM | POA: Diagnosis not present

## 2015-12-19 DIAGNOSIS — L089 Local infection of the skin and subcutaneous tissue, unspecified: Secondary | ICD-10-CM | POA: Diagnosis not present

## 2015-12-19 DIAGNOSIS — S73006D Unspecified dislocation of unspecified hip, subsequent encounter: Secondary | ICD-10-CM | POA: Diagnosis not present

## 2015-12-19 DIAGNOSIS — L02419 Cutaneous abscess of limb, unspecified: Secondary | ICD-10-CM | POA: Diagnosis not present

## 2015-12-22 DIAGNOSIS — S73006D Unspecified dislocation of unspecified hip, subsequent encounter: Secondary | ICD-10-CM | POA: Diagnosis not present

## 2015-12-22 DIAGNOSIS — B999 Unspecified infectious disease: Secondary | ICD-10-CM | POA: Diagnosis not present

## 2015-12-22 DIAGNOSIS — A4902 Methicillin resistant Staphylococcus aureus infection, unspecified site: Secondary | ICD-10-CM | POA: Diagnosis not present

## 2015-12-22 DIAGNOSIS — B9562 Methicillin resistant Staphylococcus aureus infection as the cause of diseases classified elsewhere: Secondary | ICD-10-CM | POA: Diagnosis not present

## 2015-12-22 DIAGNOSIS — L089 Local infection of the skin and subcutaneous tissue, unspecified: Secondary | ICD-10-CM | POA: Diagnosis not present

## 2015-12-22 DIAGNOSIS — L02419 Cutaneous abscess of limb, unspecified: Secondary | ICD-10-CM | POA: Diagnosis not present

## 2015-12-26 DIAGNOSIS — T8459XD Infection and inflammatory reaction due to other internal joint prosthesis, subsequent encounter: Secondary | ICD-10-CM | POA: Diagnosis not present

## 2016-01-16 DIAGNOSIS — S42009A Fracture of unspecified part of unspecified clavicle, initial encounter for closed fracture: Secondary | ICD-10-CM | POA: Diagnosis not present

## 2016-01-16 DIAGNOSIS — S73006D Unspecified dislocation of unspecified hip, subsequent encounter: Secondary | ICD-10-CM | POA: Diagnosis not present

## 2016-01-16 DIAGNOSIS — L02419 Cutaneous abscess of limb, unspecified: Secondary | ICD-10-CM | POA: Diagnosis not present

## 2016-01-16 DIAGNOSIS — B9562 Methicillin resistant Staphylococcus aureus infection as the cause of diseases classified elsewhere: Secondary | ICD-10-CM | POA: Diagnosis not present

## 2016-02-15 DIAGNOSIS — M25552 Pain in left hip: Secondary | ICD-10-CM | POA: Diagnosis not present

## 2016-02-15 DIAGNOSIS — Z471 Aftercare following joint replacement surgery: Secondary | ICD-10-CM | POA: Diagnosis not present

## 2016-02-15 DIAGNOSIS — Z96641 Presence of right artificial hip joint: Secondary | ICD-10-CM | POA: Diagnosis not present

## 2016-02-15 DIAGNOSIS — M25559 Pain in unspecified hip: Secondary | ICD-10-CM | POA: Diagnosis not present

## 2016-02-15 DIAGNOSIS — T8459XA Infection and inflammatory reaction due to other internal joint prosthesis, initial encounter: Secondary | ICD-10-CM | POA: Diagnosis not present

## 2016-02-20 DIAGNOSIS — D649 Anemia, unspecified: Secondary | ICD-10-CM | POA: Diagnosis not present

## 2016-02-20 DIAGNOSIS — I2510 Atherosclerotic heart disease of native coronary artery without angina pectoris: Secondary | ICD-10-CM | POA: Diagnosis not present

## 2016-02-20 DIAGNOSIS — E785 Hyperlipidemia, unspecified: Secondary | ICD-10-CM | POA: Diagnosis not present

## 2016-02-20 DIAGNOSIS — A498 Other bacterial infections of unspecified site: Secondary | ICD-10-CM | POA: Diagnosis not present

## 2016-02-24 DIAGNOSIS — D649 Anemia, unspecified: Secondary | ICD-10-CM | POA: Diagnosis not present

## 2016-02-24 DIAGNOSIS — E559 Vitamin D deficiency, unspecified: Secondary | ICD-10-CM | POA: Diagnosis not present

## 2016-02-24 DIAGNOSIS — I10 Essential (primary) hypertension: Secondary | ICD-10-CM | POA: Diagnosis not present

## 2016-02-24 DIAGNOSIS — E119 Type 2 diabetes mellitus without complications: Secondary | ICD-10-CM | POA: Diagnosis not present

## 2016-02-24 DIAGNOSIS — E785 Hyperlipidemia, unspecified: Secondary | ICD-10-CM | POA: Diagnosis not present

## 2016-03-05 DIAGNOSIS — E559 Vitamin D deficiency, unspecified: Secondary | ICD-10-CM | POA: Diagnosis not present

## 2016-03-05 DIAGNOSIS — J449 Chronic obstructive pulmonary disease, unspecified: Secondary | ICD-10-CM | POA: Diagnosis not present

## 2016-03-05 DIAGNOSIS — S73006D Unspecified dislocation of unspecified hip, subsequent encounter: Secondary | ICD-10-CM | POA: Diagnosis not present

## 2016-03-05 DIAGNOSIS — N400 Enlarged prostate without lower urinary tract symptoms: Secondary | ICD-10-CM | POA: Diagnosis not present

## 2016-03-27 ENCOUNTER — Encounter (HOSPITAL_COMMUNITY)
Admission: RE | Admit: 2016-03-27 | Discharge: 2016-03-27 | Disposition: A | Discharge status: Home / Self Care | Payer: Medicare Other | Source: Ambulatory Visit | Attending: Orthopedic Surgery | Admitting: Orthopedic Surgery

## 2016-03-27 ENCOUNTER — Encounter (HOSPITAL_COMMUNITY): Payer: Self-pay

## 2016-03-27 DIAGNOSIS — Z01812 Encounter for preprocedural laboratory examination: Secondary | ICD-10-CM | POA: Insufficient documentation

## 2016-03-27 HISTORY — DX: Anxiety disorder, unspecified: F41.9

## 2016-03-27 HISTORY — DX: Unspecified urinary incontinence: R32

## 2016-03-27 HISTORY — DX: Major depressive disorder, single episode, unspecified: F32.9

## 2016-03-27 HISTORY — DX: Headache: R51

## 2016-03-27 HISTORY — DX: Other anterior dislocation of left hip, initial encounter: S73.035A

## 2016-03-27 HISTORY — DX: Depression, unspecified: F32.A

## 2016-03-27 HISTORY — DX: Headache, unspecified: R51.9

## 2016-03-27 LAB — BASIC METABOLIC PANEL
ANION GAP: 7 (ref 5–15)
BUN: 22 mg/dL — ABNORMAL HIGH (ref 6–20)
CALCIUM: 8.5 mg/dL — AB (ref 8.9–10.3)
CO2: 29 mmol/L (ref 22–32)
Chloride: 98 mmol/L — ABNORMAL LOW (ref 101–111)
Creatinine, Ser: 0.61 mg/dL (ref 0.61–1.24)
GFR calc Af Amer: >60 mL/min (ref >60)
GFR calc non Af Amer: >60 mL/min (ref >60)
GLUCOSE: 93 mg/dL (ref 65–99)
Potassium: 4 mmol/L (ref 3.5–5.1)
Sodium: 134 mmol/L — ABNORMAL LOW (ref 135–145)

## 2016-03-27 LAB — CBC
HEMATOCRIT: 35.8 % — AB (ref 39.0–52.0)
HEMOGLOBIN: 11.6 g/dL — AB (ref 13.0–17.0)
MCH: 28.4 pg (ref 26.0–34.0)
MCHC: 32.4 g/dL (ref 30.0–36.0)
MCV: 87.7 fL (ref 78.0–100.0)
Platelets: 324 10*3/uL (ref 150–400)
RBC: 4.08 MIL/uL — ABNORMAL LOW (ref 4.22–5.81)
RDW: 13.4 % (ref 11.5–15.5)
WBC: 9.3 10*3/uL (ref 4.0–10.5)

## 2016-03-27 LAB — SURGICAL PCR SCREEN
MRSA, PCR: NEGATIVE
Staphylococcus aureus: NEGATIVE

## 2016-03-27 NOTE — Patient Instructions (Signed)
Alexander Hanna  03/27/2016   Your procedure is scheduled on: 04/02/2016    Report to [facility] Main  Entrance take [place]  elevators to 3rd floor to  [place] at   [place] AM.  Call this number if you have problems the morning of surgery [phone]   Remember: ONLY 1 PERSON MAY GO WITH YOU TO SHORT STAY TO GET  READY MORNING OF YOUR SURGERY.  Do not eat food or drink liquids :After Midnight.     Take these medicines the morning of surgery with A SIP OF WATER:Zyrtec, Proscar, Flonase, Synthroid, Eye drops as usual, Prilosec, Flomax                     PLEASE SEND CURRENT MAR WITH LAST DATES AND TIMES OF MEDICATIONS              You may not have any metal on your body including hair pins and              piercings  Do not wear jewelry,  lotions, powders or perfumes, deodorant                     Men may shave face and neck.   Do not bring valuables to the hospital. [place].  Contacts, dentures or bridgework may not be worn into surgery.  Leave suitcase in the car. After surgery it may be brought to your room.        Special Instructions: N/A              Please read over the following fact sheets you were given: _____________________________________________________________________             [facility] - Preparing for Surgery Before surgery, you can play an important role.  Because skin is not sterile, your skin needs to be as free of germs as possible.  You can reduce the number of germs on your skin by washing with CHG (chlorahexidine gluconate) soap before surgery.  CHG is an antiseptic cleaner which kills germs and bonds with the skin to continue killing germs even after washing. Please DO NOT use if you have an allergy to CHG or antibacterial soaps.  If your skin becomes reddened/irritated stop using the CHG and inform your nurse when you arrive at Short Stay. Do not shave (including legs and  underarms) for at least 48 hours prior to the first CHG shower.  You may shave your face/neck. Please follow these instructions carefully:  1.  Shower with CHG Soap the night before surgery and the  morning of Surgery.  2.  If you choose to wash your hair, wash your hair first as usual with your  normal  shampoo.  3.  After you shampoo, rinse your hair and body thoroughly to remove the  shampoo.                           4.  Use CHG as you would any other liquid soap.  You can apply chg directly  to the skin and wash                       Gently with a  scrungie or clean washcloth.  5.  Apply the CHG Soap to your body ONLY FROM THE NECK DOWN.   Do not use on face/ open                           Wound or open sores. Avoid contact with eyes, ears mouth and genitals (private parts).                       Wash face,  Genitals (private parts) with your normal soap.             6.  Wash thoroughly, paying special attention to the area where your surgery  will be performed.  7.  Thoroughly rinse your body with warm water from the neck down.  8.  DO NOT shower/wash with your normal soap after using and rinsing off  the CHG Soap.                9.  Pat yourself dry with a clean towel.            10.  Wear clean pajamas.            11.  Place clean sheets on your bed the night of your first shower and do not  sleep with pets. Day of Surgery : Do not apply any lotions/deodorants the morning of surgery.  Please wear clean clothes to the hospital/surgery center.  FAILURE TO FOLLOW THESE INSTRUCTIONS MAY RESULT IN THE CANCELLATION OF YOUR SURGERY PATIENT SIGNATURE_________________________________  NURSE SIGNATURE__________________________________  ________________________________________________________________________  WHAT IS A BLOOD TRANSFUSION? Blood Transfusion Information  A transfusion is the replacement of blood or some of its parts. Blood is made up of multiple cells which provide different  functions.  Red blood cells carry oxygen and are used for blood loss replacement.  White blood cells fight against infection.  Platelets control bleeding.  Plasma helps clot blood.  Other blood products are available for specialized needs, such as hemophilia or other clotting disorders. BEFORE THE TRANSFUSION  Who gives blood for transfusions?   Healthy volunteers who are fully evaluated to make sure their blood is safe. This is blood bank blood. Transfusion therapy is the safest it has ever been in the practice of medicine. Before blood is taken from a donor, a complete history is taken to make sure that person has no history of diseases nor engages in risky social behavior (examples are intravenous drug use or sexual activity with multiple partners). The donor's travel history is screened to minimize risk of transmitting infections, such as malaria. The donated blood is tested for signs of infectious diseases, such as HIV and hepatitis. The blood is then tested to be sure it is compatible with you in order to minimize the chance of a transfusion reaction. If you or a relative donates blood, this is often done in anticipation of surgery and is not appropriate for emergency situations. It takes many days to process the donated blood. RISKS AND COMPLICATIONS Although transfusion therapy is very safe and saves many lives, the main dangers of transfusion include:   Getting an infectious disease.  Developing a transfusion reaction. This is an allergic reaction to something in the blood you were given. Every precaution is taken to prevent this. The decision to have a blood transfusion has been considered carefully by your caregiver before blood is given. Blood is not given unless the benefits outweigh the risks. AFTER  THE TRANSFUSION  Right after receiving a blood transfusion, you will usually feel much better and more energetic. This is especially true if your red blood cells have gotten low  (anemic). The transfusion raises the level of the red blood cells which carry oxygen, and this usually causes an energy increase.  The nurse administering the transfusion will monitor you carefully for complications. HOME CARE INSTRUCTIONS  No special instructions are needed after a transfusion. You may find your energy is better. Speak with your caregiver about any limitations on activity for underlying diseases you may have. SEEK MEDICAL CARE IF:   Your condition is not improving after your transfusion.  You develop redness or irritation at the intravenous (IV) site. SEEK IMMEDIATE MEDICAL CARE IF:  Any of the following symptoms occur over the next 12 hours:  Shaking chills.  You have a temperature by mouth above 102 F (38.9 C), not controlled by medicine.  Chest, back, or muscle pain.  People around you feel you are not acting correctly or are confused.  Shortness of breath or difficulty breathing.  Dizziness and fainting.  You get a rash or develop hives.  You have a decrease in urine output.  Your urine turns a dark color or changes to pink, red, or brown. Any of the following symptoms occur over the next 10 days:  You have a temperature by mouth above 102 F (38.9 C), not controlled by medicine.  Shortness of breath.  Weakness after normal activity.  The white part of the eye turns yellow (jaundice).  You have a decrease in the amount of urine or are urinating less often.  Your urine turns a dark color or changes to pink, red, or brown. Document Released: 03/02/2000 Document Revised: 05/28/2011 Document Reviewed: 10/20/2007 ExitCare Patient Information 2014 [place].  _______________________________________________________________________  Incentive Spirometer  An incentive spirometer is a tool that can help keep your lungs clear and active. This tool measures how well you are filling your lungs with each breath. Taking long deep breaths may help  reverse or decrease the chance of developing breathing (pulmonary) problems (especially infection) following:  A long period of time when you are unable to move or be active. BEFORE THE PROCEDURE   If the spirometer includes an indicator to show your best effort, your nurse or respiratory therapist will set it to a desired goal.  If possible, sit up straight or lean slightly forward. Try not to slouch.  Hold the incentive spirometer in an upright position. INSTRUCTIONS FOR USE  1. Sit on the edge of your bed if possible, or sit up as far as you can in bed or on a chair. 2. Hold the incentive spirometer in an upright position. 3. Breathe out normally. 4. Place the mouthpiece in your mouth and seal your lips tightly around it. 5. Breathe in slowly and as deeply as possible, raising the piston or the ball toward the top of the column. 6. Hold your breath for 3-5 seconds or for as long as possible. Allow the piston or ball to fall to the bottom of the column. 7. Remove the mouthpiece from your mouth and breathe out normally. 8. Rest for a few seconds and repeat Steps 1 through 7 at least 10 times every 1-2 hours when you are awake. Take your time and take a few normal breaths between deep breaths. 9. The spirometer may include an indicator to show your best effort. Use the indicator as a goal to work toward during each  repetition. 10. After each set of 10 deep breaths, practice coughing to be sure your lungs are clear. If you have an incision (the cut made at the time of surgery), support your incision when coughing by placing a pillow or rolled up towels firmly against it. Once you are able to get out of bed, walk around indoors and cough well. You may stop using the incentive spirometer when instructed by your caregiver.  RISKS AND COMPLICATIONS  Take your time so you do not get dizzy or light-headed.  If you are in pain, you may need to take or ask for pain medication before doing incentive  spirometry. It is harder to take a deep breath if you are having pain. AFTER USE  Rest and breathe slowly and easily.  It can be helpful to keep track of a log of your progress. Your caregiver can provide you with a simple table to help with this. If you are using the spirometer at home, follow these instructions: [place] IF:   You are having difficultly using the spirometer.  You have trouble using the spirometer as often as instructed.  Your pain medication is not giving enough relief while using the spirometer.  You develop fever of 100.5 F (38.1 C) or higher. SEEK IMMEDIATE MEDICAL CARE IF:   You cough up bloody sputum that had not been present before.  You develop fever of 102 F (38.9 C) or greater.  You develop worsening pain at or near the incision site. MAKE SURE YOU:   Understand these instructions.  Will watch your condition.  Will get help right away if you are not doing well or get worse. Document Released: 07/16/2006 Document Revised: 05/28/2011 Document Reviewed: 09/16/2006 [facility] Patient Information 2014 [place], [place].   ________________________________________________________________________

## 2016-03-28 ENCOUNTER — Encounter (HOSPITAL_COMMUNITY): Payer: Self-pay

## 2016-03-30 NOTE — Progress Notes (Signed)
Spoke with nurse, Janace Hoard , at [facility] in [place] and she is aware for patient to take Synthroid the nite before surgery as usual and not to take am of surgery.

## 2016-04-01 NOTE — H&P (Signed)
Alexander Hanna is an 76 y.o. male.    Chief Complaint:    Infected left hip hemiarthroplasty  Procedure:   Resection left hip hemiarthroplasty - Girdlestone  HPI: Pt is a 76 y.o. male complaining of left hip  pain for 2+ years. Patient experienced a hip dislocation and was never reduced.  Paqtient now has significant contracture.  Patient never expresses much pain, but experiencing health issues related to inability to properly rotate the leg.  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  Various options are discussed with the patient. Risks, benefits and expectations were discussed with the patient's family. Patient's family understand the risks, benefits and expectations and wishes to proceed with surgery.    PCP: Maricela Curet, MD  D/C Plans:      SNF - Currently living at [facility]  Post-op Meds:       No Rx given  Tranexamic Acid:      To be given - IV  topically  Decadron:      Is to be given  FYI:     ASA  Norco    PMH: Past Medical History:  Diagnosis Date  . Anterior dislocation of left hip ([place])   . Anxiety   . Depression   . Environmental allergies   . Headache    brain damaged at age 26   . Hypothyroidism   . Incontinent of urine    wears a diaper per brother   . 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 Overactive bladder   . Polio    as a child    PSH: Past Surgical History:  Procedure Laterality Date  . CATARACT EXTRACTION W/PHACO  12/20/2011   Procedure: CATARACT EXTRACTION PHACO AND INTRAOCULAR LENS PLACEMENT (IOC);  Surgeon: Tonny Branch, MD;  Location: [facility] ORS;  Service: Ophthalmology;  Laterality: Left;  CDE: 14.80  . CATARACT EXTRACTION W/PHACO  01/14/2012   Procedure: CATARACT EXTRACTION PHACO AND INTRAOCULAR LENS PLACEMENT (IOC);  Surgeon: Tonny Branch, MD;  Location: [facility] ORS;  Service: Ophthalmology;  Laterality: Right;  CDE 19.57  . CHOLECYSTECTOMY    .  ESOPHAGOGASTRODUODENOSCOPY N/A 01/14/2014   Dr. Rourk:non-critical schatzki ring, not manipulated/small HHotherwise normal. 20 F PEG tube placed   . JOINT REPLACEMENT     rt anterior hip  . JOINT REPLACEMENT     Left hip  . PEG PLACEMENT N/A 01/14/2014   Procedure: PERCUTANEOUS ENDOSCOPIC GASTROSTOMY (PEG) PLACEMENT;  Surgeon: Daneil Dolin, MD;  Location: [facility] ENDO SUITE;  Service: Endoscopy;  Laterality: N/A;  . PERCUTANEOUS ENDOSCOPIC GASTROSTOMY (PEG) REMOVAL N/A 03/10/2015   Procedure: PERCUTANEOUS ENDOSCOPIC GASTROSTOMY (PEG) REMOVAL;  Surgeon: Daneil Dolin, MD;  Location: [facility] END[facility] SUITE;  Service: Endoscopy;  Laterality: N/A;  1115  . WOUND DEBRIDEMENT Left    Post hip replacement    Social History:  reports that he has never smoked. He has never used smokeless tobacco. He reports that he does not drink alcohol or use drugs.  Allergies:  Allergies  Allergen Reactions  . Amoxicillin Other (See Comments)    Reaction unknown-provided via [place]. Pt's brother states he is not allergic to amoxicillin  . Avelox [Moxifloxacin Hcl In Nacl]   . Hydrocodone Other (See Comments)    Reaction unknown-provided via [place]  . Moxifloxacin Other (See Comments)    Reaction unknown-provided via Nursing Facility    Medications: No  current facility-administered medications for this encounter.    Current Outpatient Prescriptions  Medication Sig Dispense Refill  . acetaminophen (TYLENOL) 325 MG tablet Take 650 mg by mouth 3 (three) times daily.    Marland Kitchen acidophilus (RISAQUAD) CAPS capsule Take 1 capsule by mouth 2 (two) times daily.    . Amino Acids-Protein Hydrolys (FEEDING SUPPLEMENT, PRO-STAT SUGAR FREE 64,) LIQD Take 30 mLs by mouth 2 (two) times daily.    Marland Kitchen ascorbic acid (VITAMIN C) 500 MG tablet Take 500 mg by mouth daily.    Marland Kitchen aspirin EC 81 MG tablet Take 81 mg by mouth daily.    Marland Kitchen atorvastatin (LIPITOR) 10 MG tablet Take 10 mg by mouth daily at 6 PM.     . cetirizine  (ZYRTEC) 10 MG tablet Take 10 mg by mouth daily.    . cholecalciferol (VITAMIN D) 1000 units tablet Take 1,000 Units by mouth daily.    Marland Kitchen docusate sodium (COLACE) 100 MG capsule Take 100 mg by mouth 2 (two) times daily.    Marland Kitchen ezetimibe (ZETIA) 10 MG tablet Take 10 mg by mouth daily.    . ferrous sulfate 325 (65 FE) MG tablet Take 325 mg by mouth 2 (two) times daily.     . finasteride (PROSCAR) 5 MG tablet Take 5 mg by mouth daily.     . fluticasone (FLONASE) 50 MCG/ACT nasal spray Place 2 sprays into the nose 2 (two) times daily. Allergies    . guaiFENesin (MUCINEX) 600 MG 12 hr tablet Take 600 mg by mouth 2 (two) times daily.     . hydroxypropyl methylcellulose / hypromellose (ISOPTO TEARS / GONIOVISC) 2.5 % ophthalmic solution Place 1 drop into both eyes 4 (four) times daily as needed for dry eyes.    Marland Kitchen levothyroxine (SYNTHROID, LEVOTHROID) 150 MCG tablet Take 150 mcg by mouth at bedtime.    . Multiple Vitamin (MULTIVITAMIN) tablet Take 1 tablet by mouth daily.    . niacin (NIASPAN) 500 MG CR tablet Take 500 mg by mouth at bedtime.     Marland Kitchen omeprazole (PRILOSEC) 20 MG capsule Take 20 mg by mouth daily.     . polyethylene glycol (MIRALAX / GLYCOLAX) packet Take 17 g by mouth daily.    . tamsulosin (FLOMAX) 0.4 MG CAPS capsule Take 0.4 mg by mouth daily.     . traMADol (ULTRAM) 50 MG tablet Take 50 mg by mouth every 6 (six) hours as needed for moderate pain.     . traZODone (DESYREL) 50 MG tablet Take 12.5 mg by mouth at bedtime.    . Nutritional Supplements (RESOURCE 2.0) LIQD Take 120 mLs by mouth 2 (two) times daily.        Review of Systems  Constitutional: Negative.   HENT: Negative.   Eyes: Negative.   Respiratory: Negative.   Cardiovascular: Negative.   Gastrointestinal: Negative.   Genitourinary: Positive for frequency.  Musculoskeletal: Positive for joint pain.  Skin: Negative.   Neurological: Positive for headaches.  Endo/Heme/Allergies: Positive for environmental allergies.    Psychiatric/Behavioral: Positive for depression and memory loss. The patient is nervous/anxious.      Physical Exam  Constitutional: He is oriented to person, place, and time. He appears well-developed.  HENT:  Head: Normocephalic.  Eyes: Pupils are equal, round, and reactive to light.  Neck: Neck supple. No JVD present. No tracheal deviation present. No thyromegaly present.  Cardiovascular: Normal rate, regular rhythm, normal heart sounds and intact distal pulses.   Respiratory: Effort normal and breath sounds  normal. No respiratory distress. He has no wheezes.  GI: Soft. There is no tenderness. There is no guarding.  Musculoskeletal:       Left hip: He exhibits decreased range of motion, decreased strength and deformity.  Lymphadenopathy:    He has no cervical adenopathy.  Neurological: He is alert and oriented to person, place, and time.  Skin: Skin is warm and dry.  Psychiatric: He has a normal mood and affect.        Assessment/Plan Assessment:   Infected left hip hemiarthroplasty   Plan: Patient will undergo a resection left hip hemiarthroplasty - Girdlestone on 04/02/2016 per Dr. Alvan Dame at [facility] Risks benefits and expectations were discussed with the patient. Patient understand risks, benefits and expectations and wishes to proceed.     [place] Raysha Tilmon   PA-C  04/01/2016, 8:16 PM

## 2016-04-02 ENCOUNTER — Inpatient Hospital Stay (HOSPITAL_COMMUNITY): Payer: Medicare Other | Admitting: Anesthesiology

## 2016-04-02 ENCOUNTER — Encounter (HOSPITAL_COMMUNITY): Payer: Self-pay | Admitting: *Deleted

## 2016-04-02 ENCOUNTER — Encounter (HOSPITAL_COMMUNITY)
Admission: RE | Disposition: A | Discharge status: Skilled Nursing Facility | Payer: Self-pay | Source: Ambulatory Visit | Attending: Orthopedic Surgery

## 2016-04-02 ENCOUNTER — Inpatient Hospital Stay (HOSPITAL_COMMUNITY)
Admission: RE | Admit: 2016-04-02 | Discharge: 2016-04-04 | DRG: 465 | Disposition: A | Discharge status: Skilled Nursing Facility | Payer: Medicare Other | Source: Ambulatory Visit | Attending: Orthopedic Surgery | Admitting: Orthopedic Surgery

## 2016-04-02 DIAGNOSIS — Z881 Allergy status to other antibiotic agents status: Secondary | ICD-10-CM | POA: Diagnosis not present

## 2016-04-02 DIAGNOSIS — M25552 Pain in left hip: Secondary | ICD-10-CM | POA: Diagnosis not present

## 2016-04-02 DIAGNOSIS — M24452 Recurrent dislocation, left hip: Secondary | ICD-10-CM | POA: Diagnosis present

## 2016-04-02 DIAGNOSIS — Z8249 Family history of ischemic heart disease and other diseases of the circulatory system: Secondary | ICD-10-CM

## 2016-04-02 DIAGNOSIS — Z801 Family history of malignant neoplasm of trachea, bronchus and lung: Secondary | ICD-10-CM

## 2016-04-02 DIAGNOSIS — Z7951 Long term (current) use of inhaled steroids: Secondary | ICD-10-CM | POA: Diagnosis not present

## 2016-04-02 DIAGNOSIS — Z7982 Long term (current) use of aspirin: Secondary | ICD-10-CM | POA: Diagnosis not present

## 2016-04-02 DIAGNOSIS — Z8614 Personal history of Methicillin resistant Staphylococcus aureus infection: Secondary | ICD-10-CM | POA: Diagnosis not present

## 2016-04-02 DIAGNOSIS — T8452XA Infection and inflammatory reaction due to internal left hip prosthesis, initial encounter: Secondary | ICD-10-CM | POA: Diagnosis not present

## 2016-04-02 DIAGNOSIS — Y831 Surgical operation with implant of artificial internal device as the cause of abnormal reaction of the patient, or of later complication, without mention of misadventure at the time of the procedure: Secondary | ICD-10-CM | POA: Diagnosis present

## 2016-04-02 DIAGNOSIS — T84031A Mechanical loosening of internal left hip prosthetic joint, initial encounter: Secondary | ICD-10-CM | POA: Diagnosis present

## 2016-04-02 DIAGNOSIS — Z79899 Other long term (current) drug therapy: Secondary | ICD-10-CM | POA: Diagnosis not present

## 2016-04-02 DIAGNOSIS — E039 Hypothyroidism, unspecified: Secondary | ICD-10-CM | POA: Diagnosis not present

## 2016-04-02 DIAGNOSIS — M25559 Pain in unspecified hip: Secondary | ICD-10-CM | POA: Diagnosis not present

## 2016-04-02 DIAGNOSIS — T8451XD Infection and inflammatory reaction due to internal right hip prosthesis, subsequent encounter: Secondary | ICD-10-CM | POA: Diagnosis not present

## 2016-04-02 DIAGNOSIS — Z96643 Presence of artificial hip joint, bilateral: Secondary | ICD-10-CM | POA: Diagnosis present

## 2016-04-02 DIAGNOSIS — Z885 Allergy status to narcotic agent status: Secondary | ICD-10-CM

## 2016-04-02 DIAGNOSIS — B9562 Methicillin resistant Staphylococcus aureus infection as the cause of diseases classified elsewhere: Secondary | ICD-10-CM | POA: Diagnosis not present

## 2016-04-02 DIAGNOSIS — T8459XA Infection and inflammatory reaction due to other internal joint prosthesis, initial encounter: Secondary | ICD-10-CM

## 2016-04-02 DIAGNOSIS — Y792 Prosthetic and other implants, materials and accessory orthopedic devices associated with adverse incidents: Secondary | ICD-10-CM

## 2016-04-02 DIAGNOSIS — M86152 Other acute osteomyelitis, left femur: Secondary | ICD-10-CM | POA: Diagnosis not present

## 2016-04-02 DIAGNOSIS — Z8612 Personal history of poliomyelitis: Secondary | ICD-10-CM | POA: Diagnosis not present

## 2016-04-02 DIAGNOSIS — N3281 Overactive bladder: Secondary | ICD-10-CM | POA: Diagnosis present

## 2016-04-02 DIAGNOSIS — Z9889 Other specified postprocedural states: Secondary | ICD-10-CM

## 2016-04-02 DIAGNOSIS — M810 Age-related osteoporosis without current pathological fracture: Secondary | ICD-10-CM | POA: Diagnosis not present

## 2016-04-02 DIAGNOSIS — M00051 Staphylococcal arthritis, right hip: Secondary | ICD-10-CM

## 2016-04-02 DIAGNOSIS — Z8619 Personal history of other infectious and parasitic diseases: Secondary | ICD-10-CM

## 2016-04-02 DIAGNOSIS — F418 Other specified anxiety disorders: Secondary | ICD-10-CM | POA: Diagnosis not present

## 2016-04-02 DIAGNOSIS — Z87828 Personal history of other (healed) physical injury and trauma: Secondary | ICD-10-CM | POA: Diagnosis not present

## 2016-04-02 DIAGNOSIS — Z96649 Presence of unspecified artificial hip joint: Secondary | ICD-10-CM

## 2016-04-02 HISTORY — PX: EXCISIONAL TOTAL HIP ARTHROPLASTY WITH ANTIBIOTIC SPACERS: SHX5826

## 2016-04-02 LAB — TYPE AND SCREEN
ABO/RH(D): A POS
Antibody Screen: NEGATIVE

## 2016-04-02 LAB — CBC WITH DIFFERENTIAL/PLATELET
Basophils Absolute: 0 10*3/uL (ref 0.0–0.1)
Basophils Relative: 0 %
Eosinophils Absolute: 0 10*3/uL (ref 0.0–0.7)
Eosinophils Relative: 0 %
HCT: 32.4 % — ABNORMAL LOW (ref 39.0–52.0)
Hemoglobin: 10.6 g/dL — ABNORMAL LOW (ref 13.0–17.0)
Lymphocytes Relative: 7 %
Lymphs Abs: 0.7 10*3/uL (ref 0.7–4.0)
MCH: 28.5 pg (ref 26.0–34.0)
MCHC: 32.7 g/dL (ref 30.0–36.0)
MCV: 87.1 fL (ref 78.0–100.0)
Monocytes Absolute: 0.1 10*3/uL (ref 0.1–1.0)
Monocytes Relative: 1 %
Neutro Abs: 8.5 10*3/uL — ABNORMAL HIGH (ref 1.7–7.7)
Neutrophils Relative %: 92 %
Platelets: 276 10*3/uL (ref 150–400)
RBC: 3.72 MIL/uL — ABNORMAL LOW (ref 4.22–5.81)
RDW: 13.4 % (ref 11.5–15.5)
WBC: 9.3 10*3/uL (ref 4.0–10.5)

## 2016-04-02 LAB — COMPREHENSIVE METABOLIC PANEL
ALT: 18 U/L (ref 17–63)
AST: 31 U/L (ref 15–41)
Albumin: 3 g/dL — ABNORMAL LOW (ref 3.5–5.0)
Alkaline Phosphatase: 79 U/L (ref 38–126)
Anion gap: 8 (ref 5–15)
BUN: 10 mg/dL (ref 6–20)
CO2: 26 mmol/L (ref 22–32)
Calcium: 7.9 mg/dL — ABNORMAL LOW (ref 8.9–10.3)
Chloride: 98 mmol/L — ABNORMAL LOW (ref 101–111)
Creatinine, Ser: 0.61 mg/dL (ref 0.61–1.24)
GFR calc Af Amer: >60 mL/min (ref >60)
GFR calc non Af Amer: >60 mL/min (ref >60)
Glucose, Bld: 171 mg/dL — ABNORMAL HIGH (ref 65–99)
Potassium: 4.2 mmol/L (ref 3.5–5.1)
Sodium: 132 mmol/L — ABNORMAL LOW (ref 135–145)
Total Bilirubin: 0.5 mg/dL (ref 0.3–1.2)
Total Protein: 6.4 g/dL — ABNORMAL LOW (ref 6.5–8.1)

## 2016-04-02 LAB — C-REACTIVE PROTEIN: CRP: 8.2 mg/dL — ABNORMAL HIGH (ref <1.0)

## 2016-04-02 LAB — SEDIMENTATION RATE: SED RATE: 50 mm/h — AB (ref 0–16)

## 2016-04-02 SURGERY — EXCISIONAL TOTAL HIP ARTHROPLASTY WITH ANTIBIOTIC SPACERS
Anesthesia: General | Site: Hip | Laterality: Left

## 2016-04-02 MED ORDER — LIDOCAINE 2% (20 MG/ML) 5 ML SYRINGE
INTRAMUSCULAR | Status: DC | PRN
  Administered 2016-04-02: 100 mg via INTRAVENOUS

## 2016-04-02 MED ORDER — POLYVINYL ALCOHOL 1.4 % OP SOLN
1.0000 [drp] | Freq: Four times a day (QID) | OPHTHALMIC | Status: DC | PRN
  Administered 2016-04-02: 15:00:00 1 [drp] via OPHTHALMIC
  Filled 2016-04-02: qty 15

## 2016-04-02 MED ORDER — PHENYLEPHRINE HCL 10 MG/ML IJ SOLN
INTRAMUSCULAR | Status: AC
  Filled 2016-04-02: qty 1

## 2016-04-02 MED ORDER — DOCUSATE SODIUM 100 MG PO CAPS
100.0000 mg | ORAL_CAPSULE | Freq: Two times a day (BID) | ORAL | Status: DC
  Administered 2016-04-02 – 2016-04-04 (×5): 100 mg via ORAL
  Filled 2016-04-02 (×5): qty 1

## 2016-04-02 MED ORDER — SODIUM CHLORIDE 0.9 % IV SOLN
1000.0000 mg | Freq: Once | INTRAVENOUS | Status: AC
Start: 2016-04-02 — End: 2016-04-02
  Administered 2016-04-02: 1000 mg via INTRAVENOUS
  Filled 2016-04-02: qty 1000

## 2016-04-02 MED ORDER — PHENYLEPHRINE HCL 10 MG/ML IJ SOLN
INTRAVENOUS | Status: DC | PRN
  Administered 2016-04-02: 25 ug/min via INTRAVENOUS

## 2016-04-02 MED ORDER — HYDROMORPHONE HCL 1 MG/ML IJ SOLN: 0.5000 mg | INTRAMUSCULAR | Status: DC | PRN

## 2016-04-02 MED ORDER — METHOCARBAMOL 1000 MG/10ML IJ SOLN
500.0000 mg | Freq: Four times a day (QID) | INTRAMUSCULAR | Status: DC | PRN
  Administered 2016-04-02: 500 mg via INTRAVENOUS
  Filled 2016-04-02: qty 550
  Filled 2016-04-02: qty 5

## 2016-04-02 MED ORDER — FENTANYL CITRATE (PF) 100 MCG/2ML IJ SOLN
INTRAMUSCULAR | Status: DC | PRN
  Administered 2016-04-02: 25 ug via INTRAVENOUS

## 2016-04-02 MED ORDER — PHENOL 1.4 % MT LIQD: 1.0000 | OROMUCOSAL | Status: DC | PRN

## 2016-04-02 MED ORDER — DEXAMETHASONE SODIUM PHOSPHATE 10 MG/ML IJ SOLN
10.0000 mg | Freq: Once | INTRAMUSCULAR | Status: AC
  Administered 2016-04-03: 09:00:00 10 mg via INTRAVENOUS
  Filled 2016-04-02: qty 1

## 2016-04-02 MED ORDER — POLYETHYLENE GLYCOL 3350 17 G PO PACK
17.0000 g | PACK | Freq: Two times a day (BID) | ORAL | Status: DC
  Administered 2016-04-02 – 2016-04-04 (×5): 17 g via ORAL
  Filled 2016-04-02 (×5): qty 1

## 2016-04-02 MED ORDER — ONDANSETRON HCL 4 MG/2ML IJ SOLN: 4.0000 mg | Freq: Once | INTRAMUSCULAR | Status: DC | PRN

## 2016-04-02 MED ORDER — LEVOTHYROXINE SODIUM 75 MCG PO TABS
150.0000 ug | ORAL_TABLET | Freq: Every day | ORAL | Status: DC
  Administered 2016-04-02 – 2016-04-03 (×2): 150 ug via ORAL
  Filled 2016-04-02 (×2): qty 2

## 2016-04-02 MED ORDER — ROCURONIUM BROMIDE 50 MG/5ML IV SOSY
PREFILLED_SYRINGE | INTRAVENOUS | Status: AC
  Filled 2016-04-02: qty 5

## 2016-04-02 MED ORDER — PHENYLEPHRINE 40 MCG/ML (10ML) SYRINGE FOR IV PUSH (FOR BLOOD PRESSURE SUPPORT)
PREFILLED_SYRINGE | INTRAVENOUS | Status: AC
  Filled 2016-04-02: qty 10

## 2016-04-02 MED ORDER — DEXTROSE 5 % IV SOLN
2.0000 g | Freq: Two times a day (BID) | INTRAVENOUS | Status: DC
  Administered 2016-04-02 – 2016-04-04 (×5): 2 g via INTRAVENOUS
  Filled 2016-04-02 (×7): qty 2

## 2016-04-02 MED ORDER — SUGAMMADEX SODIUM 200 MG/2ML IV SOLN
INTRAVENOUS | Status: DC | PRN
  Administered 2016-04-02: 200 mg via INTRAVENOUS

## 2016-04-02 MED ORDER — ASPIRIN 81 MG PO CHEW
81.0000 mg | CHEWABLE_TABLET | Freq: Two times a day (BID) | ORAL | Status: DC
  Administered 2016-04-02 – 2016-04-04 (×4): 81 mg via ORAL
  Filled 2016-04-02 (×4): qty 1

## 2016-04-02 MED ORDER — TRAMADOL HCL 50 MG PO TABS
50.0000 mg | ORAL_TABLET | Freq: Four times a day (QID) | ORAL | Status: DC
  Administered 2016-04-02 – 2016-04-03 (×4): 100 mg via ORAL
  Administered 2016-04-03 – 2016-04-04 (×3): 50 mg via ORAL
  Filled 2016-04-02: qty 2
  Filled 2016-04-02 (×2): qty 1
  Filled 2016-04-02: qty 2
  Filled 2016-04-02: qty 1
  Filled 2016-04-02 (×2): qty 2

## 2016-04-02 MED ORDER — MAGNESIUM CITRATE PO SOLN: 1.0000 | Freq: Once | ORAL | Status: DC | PRN

## 2016-04-02 MED ORDER — ONDANSETRON HCL 4 MG/2ML IJ SOLN
INTRAMUSCULAR | Status: DC | PRN
  Administered 2016-04-02: 4 mg via INTRAVENOUS

## 2016-04-02 MED ORDER — PHENYLEPHRINE 40 MCG/ML (10ML) SYRINGE FOR IV PUSH (FOR BLOOD PRESSURE SUPPORT)
PREFILLED_SYRINGE | INTRAVENOUS | Status: DC | PRN
  Administered 2016-04-02 (×2): 80 ug via INTRAVENOUS

## 2016-04-02 MED ORDER — VANCOMYCIN HCL IN DEXTROSE 1-5 GM/200ML-% IV SOLN
INTRAVENOUS | Status: AC
  Filled 2016-04-02: qty 200

## 2016-04-02 MED ORDER — GUAIFENESIN ER 600 MG PO TB12
600.0000 mg | ORAL_TABLET | Freq: Two times a day (BID) | ORAL | Status: DC
  Administered 2016-04-02 – 2016-04-04 (×5): 600 mg via ORAL
  Filled 2016-04-02 (×5): qty 1

## 2016-04-02 MED ORDER — VANCOMYCIN HCL IN DEXTROSE 750-5 MG/150ML-% IV SOLN
750.0000 mg | Freq: Two times a day (BID) | INTRAVENOUS | Status: DC
  Administered 2016-04-02 – 2016-04-04 (×4): 750 mg via INTRAVENOUS
  Filled 2016-04-02 (×5): qty 150

## 2016-04-02 MED ORDER — ONDANSETRON HCL 4 MG/2ML IJ SOLN
INTRAMUSCULAR | Status: AC
  Filled 2016-04-02: qty 2

## 2016-04-02 MED ORDER — EPHEDRINE 5 MG/ML INJ
INTRAVENOUS | Status: AC
  Filled 2016-04-02: qty 10

## 2016-04-02 MED ORDER — SODIUM CHLORIDE 0.9 % IV BOLUS (SEPSIS)
250.0000 mL | Freq: Once | INTRAVENOUS | Status: AC
  Administered 2016-04-02: 250 mL via INTRAVENOUS

## 2016-04-02 MED ORDER — HYDROMORPHONE HCL 1 MG/ML IJ SOLN
0.2500 mg | INTRAMUSCULAR | Status: DC | PRN
  Administered 2016-04-02: 0.25 mg via INTRAVENOUS

## 2016-04-02 MED ORDER — METOCLOPRAMIDE HCL 5 MG/ML IJ SOLN
5.0000 mg | Freq: Three times a day (TID) | INTRAMUSCULAR | Status: DC | PRN
Start: 2016-04-02 — End: 2016-04-04

## 2016-04-02 MED ORDER — HYPROMELLOSE (GONIOSCOPIC) 2.5 % OP SOLN: 1.0000 [drp] | Freq: Four times a day (QID) | OPHTHALMIC | Status: DC | PRN

## 2016-04-02 MED ORDER — NIACIN ER (ANTIHYPERLIPIDEMIC) 500 MG PO TBCR
500.0000 mg | EXTENDED_RELEASE_TABLET | Freq: Every day | ORAL | Status: DC
  Administered 2016-04-02 – 2016-04-03 (×2): 500 mg via ORAL
  Filled 2016-04-02 (×3): qty 1

## 2016-04-02 MED ORDER — FINASTERIDE 5 MG PO TABS
5.0000 mg | ORAL_TABLET | Freq: Every day | ORAL | Status: DC
  Administered 2016-04-02 – 2016-04-04 (×3): 5 mg via ORAL
  Filled 2016-04-02 (×3): qty 1

## 2016-04-02 MED ORDER — METOCLOPRAMIDE HCL 5 MG PO TABS: 5.0000 mg | ORAL_TABLET | Freq: Three times a day (TID) | ORAL | Status: DC | PRN

## 2016-04-02 MED ORDER — TRANEXAMIC ACID 1000 MG/10ML IV SOLN
1000.0000 mg | INTRAVENOUS | Status: AC
  Administered 2016-04-02: 1000 mg via INTRAVENOUS
  Filled 2016-04-02: qty 1100

## 2016-04-02 MED ORDER — LIDOCAINE 2% (20 MG/ML) 5 ML SYRINGE
INTRAMUSCULAR | Status: AC
  Filled 2016-04-02: qty 5

## 2016-04-02 MED ORDER — FLUTICASONE PROPIONATE 50 MCG/ACT NA SUSP
2.0000 | Freq: Two times a day (BID) | NASAL | Status: DC
  Administered 2016-04-02 – 2016-04-04 (×5): 2 via NASAL
  Filled 2016-04-02: qty 16

## 2016-04-02 MED ORDER — CEFAZOLIN SODIUM-DEXTROSE 2-4 GM/100ML-% IV SOLN
INTRAVENOUS | Status: AC
  Filled 2016-04-02: qty 100

## 2016-04-02 MED ORDER — POTASSIUM CHLORIDE 2 MEQ/ML IV SOLN
100.0000 mL/h | INTRAVENOUS | Status: DC
  Administered 2016-04-02: 15:00:00 100 mL/h via INTRAVENOUS
  Administered 2016-04-03: 05:00:00 30 mL/h via INTRAVENOUS
  Filled 2016-04-02 (×7): qty 1000

## 2016-04-02 MED ORDER — 0.9 % SODIUM CHLORIDE (POUR BTL) OPTIME
TOPICAL | Status: DC | PRN
  Administered 2016-04-02: 1000 mL

## 2016-04-02 MED ORDER — ROCURONIUM BROMIDE 10 MG/ML (PF) SYRINGE
PREFILLED_SYRINGE | INTRAVENOUS | Status: DC | PRN
  Administered 2016-04-02: 40 mg via INTRAVENOUS

## 2016-04-02 MED ORDER — TAMSULOSIN HCL 0.4 MG PO CAPS
0.4000 mg | ORAL_CAPSULE | Freq: Every day | ORAL | Status: DC
  Administered 2016-04-02 – 2016-04-04 (×3): 0.4 mg via ORAL
  Filled 2016-04-02 (×3): qty 1

## 2016-04-02 MED ORDER — BISACODYL 10 MG RE SUPP: 10.0000 mg | Freq: Every day | RECTAL | Status: DC | PRN

## 2016-04-02 MED ORDER — EZETIMIBE 10 MG PO TABS
10.0000 mg | ORAL_TABLET | Freq: Every day | ORAL | Status: DC
  Administered 2016-04-02 – 2016-04-04 (×3): 10 mg via ORAL
  Filled 2016-04-02 (×3): qty 1

## 2016-04-02 MED ORDER — VITAMIN C 500 MG PO TABS
500.0000 mg | ORAL_TABLET | Freq: Every day | ORAL | Status: DC
  Administered 2016-04-03 – 2016-04-04 (×2): 500 mg via ORAL
  Filled 2016-04-02 (×2): qty 1

## 2016-04-02 MED ORDER — SODIUM CHLORIDE 0.9 % IR SOLN
Status: DC | PRN
  Administered 2016-04-02: 3000 mL

## 2016-04-02 MED ORDER — LACTATED RINGERS IV SOLN
INTRAVENOUS | Status: DC
  Administered 2016-04-02: 1000 mL via INTRAVENOUS
  Administered 2016-04-02: 12:00:00 via INTRAVENOUS

## 2016-04-02 MED ORDER — PROPOFOL 10 MG/ML IV BOLUS
INTRAVENOUS | Status: AC
  Filled 2016-04-02: qty 20

## 2016-04-02 MED ORDER — PANTOPRAZOLE SODIUM 40 MG PO TBEC
40.0000 mg | DELAYED_RELEASE_TABLET | Freq: Every day | ORAL | Status: DC
  Administered 2016-04-02 – 2016-04-04 (×3): 40 mg via ORAL
  Filled 2016-04-02 (×3): qty 1

## 2016-04-02 MED ORDER — DEXAMETHASONE SODIUM PHOSPHATE 10 MG/ML IJ SOLN
INTRAMUSCULAR | Status: AC
  Filled 2016-04-02: qty 1

## 2016-04-02 MED ORDER — DIPHENHYDRAMINE HCL 25 MG PO CAPS: 25.0000 mg | ORAL_CAPSULE | Freq: Four times a day (QID) | ORAL | Status: DC | PRN

## 2016-04-02 MED ORDER — ONDANSETRON HCL 4 MG/2ML IJ SOLN
4.0000 mg | Freq: Four times a day (QID) | INTRAMUSCULAR | Status: DC | PRN
  Administered 2016-04-03: 02:00:00 4 mg via INTRAVENOUS
  Filled 2016-04-02: qty 2

## 2016-04-02 MED ORDER — METHOCARBAMOL 500 MG PO TABS
500.0000 mg | ORAL_TABLET | Freq: Four times a day (QID) | ORAL | Status: DC | PRN
  Administered 2016-04-03: 500 mg via ORAL
  Filled 2016-04-02: qty 1

## 2016-04-02 MED ORDER — MENTHOL 3 MG MT LOZG: 1.0000 | LOZENGE | OROMUCOSAL | Status: DC | PRN

## 2016-04-02 MED ORDER — CELECOXIB 200 MG PO CAPS
200.0000 mg | ORAL_CAPSULE | Freq: Two times a day (BID) | ORAL | Status: DC
  Administered 2016-04-02 – 2016-04-04 (×4): 200 mg via ORAL
  Filled 2016-04-02 (×4): qty 1

## 2016-04-02 MED ORDER — RISAQUAD PO CAPS
1.0000 | ORAL_CAPSULE | Freq: Two times a day (BID) | ORAL | Status: DC
  Administered 2016-04-02 – 2016-04-04 (×5): 1 via ORAL
  Filled 2016-04-02 (×6): qty 1

## 2016-04-02 MED ORDER — SUGAMMADEX SODIUM 200 MG/2ML IV SOLN
INTRAVENOUS | Status: AC
  Filled 2016-04-02: qty 2

## 2016-04-02 MED ORDER — HYDROMORPHONE HCL 1 MG/ML IJ SOLN
INTRAMUSCULAR | Status: AC
  Administered 2016-04-02: 0.25 mg via INTRAVENOUS
  Filled 2016-04-02: qty 1

## 2016-04-02 MED ORDER — SODIUM CHLORIDE 0.9 % IV BOLUS (SEPSIS)
250.0000 mL | Freq: Once | INTRAVENOUS | Status: AC
  Administered 2016-04-02: 18:00:00 250 mL via INTRAVENOUS

## 2016-04-02 MED ORDER — TRAZODONE HCL 50 MG PO TABS
12.5000 mg | ORAL_TABLET | Freq: Every day | ORAL | Status: DC
  Administered 2016-04-02 – 2016-04-03 (×2): 25 mg via ORAL
  Filled 2016-04-02 (×2): qty 1

## 2016-04-02 MED ORDER — ACETAMINOPHEN 500 MG PO TABS
1000.0000 mg | ORAL_TABLET | Freq: Three times a day (TID) | ORAL | Status: DC
  Administered 2016-04-02 – 2016-04-04 (×6): 1000 mg via ORAL
  Filled 2016-04-02 (×6): qty 2

## 2016-04-02 MED ORDER — SUCCINYLCHOLINE CHLORIDE 200 MG/10ML IV SOSY
PREFILLED_SYRINGE | INTRAVENOUS | Status: AC
  Filled 2016-04-02: qty 10

## 2016-04-02 MED ORDER — CEFAZOLIN SODIUM-DEXTROSE 2-4 GM/100ML-% IV SOLN
2.0000 g | INTRAVENOUS | Status: AC
  Administered 2016-04-02: 2 g via INTRAVENOUS
  Filled 2016-04-02: qty 100

## 2016-04-02 MED ORDER — FENTANYL CITRATE (PF) 100 MCG/2ML IJ SOLN
INTRAMUSCULAR | Status: AC
  Filled 2016-04-02: qty 2

## 2016-04-02 MED ORDER — LORATADINE 10 MG PO TABS
10.0000 mg | ORAL_TABLET | Freq: Every day | ORAL | Status: DC
  Administered 2016-04-02 – 2016-04-03 (×2): 10 mg via ORAL
  Filled 2016-04-02 (×2): qty 1

## 2016-04-02 MED ORDER — DEXAMETHASONE SODIUM PHOSPHATE 10 MG/ML IJ SOLN
10.0000 mg | Freq: Once | INTRAMUSCULAR | Status: AC
  Administered 2016-04-02: 10 mg via INTRAVENOUS

## 2016-04-02 MED ORDER — ENSURE ENLIVE PO LIQD
120.0000 mL | Freq: Two times a day (BID) | ORAL | Status: DC
Start: 2016-04-02 — End: 2016-04-04
  Administered 2016-04-02 – 2016-04-04 (×6): 120 mL via ORAL

## 2016-04-02 MED ORDER — ONDANSETRON HCL 4 MG PO TABS: 4.0000 mg | ORAL_TABLET | Freq: Four times a day (QID) | ORAL | Status: DC | PRN

## 2016-04-02 MED ORDER — SODIUM CHLORIDE 0.9% FLUSH
10.0000 mL | Freq: Two times a day (BID) | INTRAVENOUS | Status: DC
  Administered 2016-04-02 – 2016-04-04 (×2): 10 mL

## 2016-04-02 MED ORDER — FERROUS SULFATE 325 (65 FE) MG PO TABS
325.0000 mg | ORAL_TABLET | Freq: Three times a day (TID) | ORAL | Status: DC
  Administered 2016-04-02 – 2016-04-04 (×5): 325 mg via ORAL
  Filled 2016-04-02 (×5): qty 1

## 2016-04-02 MED ORDER — ATORVASTATIN CALCIUM 10 MG PO TABS
10.0000 mg | ORAL_TABLET | Freq: Every day | ORAL | Status: DC
  Administered 2016-04-02 – 2016-04-03 (×2): 10 mg via ORAL
  Filled 2016-04-02 (×2): qty 1

## 2016-04-02 MED ORDER — SODIUM CHLORIDE 0.9% FLUSH: 10.0000 mL | INTRAVENOUS | Status: DC | PRN

## 2016-04-02 MED ORDER — EPHEDRINE SULFATE-NACL 50-0.9 MG/10ML-% IV SOSY
PREFILLED_SYRINGE | INTRAVENOUS | Status: DC | PRN
Start: 2016-04-02 — End: 2016-04-02
  Administered 2016-04-02: 15 mg via INTRAVENOUS
  Administered 2016-04-02: 10 mg via INTRAVENOUS

## 2016-04-02 MED ORDER — ALUM & MAG HYDROXIDE-SIMETH 200-200-20 MG/5ML PO SUSP: 30.0000 mL | ORAL | Status: DC | PRN

## 2016-04-02 MED ORDER — MEPERIDINE HCL 50 MG/ML IJ SOLN: 6.2500 mg | INTRAMUSCULAR | Status: DC | PRN

## 2016-04-02 MED ORDER — PROPOFOL 10 MG/ML IV BOLUS
INTRAVENOUS | Status: DC | PRN
  Administered 2016-04-02: 140 mg via INTRAVENOUS

## 2016-04-02 SURGICAL SUPPLY — 51 items
BAG SPEC THK2 15X12 ZIP CLS (MISCELLANEOUS) ×1
BAG ZIPLOCK 12X15 (MISCELLANEOUS) ×3 IMPLANT
BLADE SAW SGTL 18X1.27X75 (BLADE) IMPLANT
BLADE SAW SGTL 18X1.27X75MM (BLADE)
BRUSH FEMORAL CANAL (MISCELLANEOUS) ×3 IMPLANT
DRAPE INCISE IOBAN 85X60 (DRAPES) ×3 IMPLANT
DRAPE ORTHO SPLIT 77X108 STRL (DRAPES) ×6
DRAPE POUCH INSTRU U-SHP 10X18 (DRAPES) ×3 IMPLANT
DRAPE SURG 17X11 SM STRL (DRAPES) ×3 IMPLANT
DRAPE SURG ORHT 6 SPLT 77X108 (DRAPES) ×2 IMPLANT
DRAPE U-SHAPE 47X51 STRL (DRAPES) ×3 IMPLANT
DRESSING AQUACEL AG SP 3.5X10 (GAUZE/BANDAGES/DRESSINGS) ×1 IMPLANT
DRSG AQUACEL AG SP 3.5X10 (GAUZE/BANDAGES/DRESSINGS) ×3
DRSG EMULSION OIL 3X16 NADH (GAUZE/BANDAGES/DRESSINGS) IMPLANT
DRSG MEPILEX BORDER 4X12 (GAUZE/BANDAGES/DRESSINGS) ×3 IMPLANT
DRSG MEPILEX BORDER 4X4 (GAUZE/BANDAGES/DRESSINGS) IMPLANT
DRSG MEPILEX BORDER 4X8 (GAUZE/BANDAGES/DRESSINGS) IMPLANT
DURAPREP 26ML APPLICATOR (WOUND CARE) ×3 IMPLANT
ELECT BLADE TIP CTD 4 INCH (ELECTRODE) ×3 IMPLANT
ELECT REM PT RETURN 9FT ADLT (ELECTROSURGICAL) ×3
ELECTRODE REM PT RTRN 9FT ADLT (ELECTROSURGICAL) ×1 IMPLANT
FACESHIELD WRAPAROUND (MASK) ×12 IMPLANT
GLOVE BIOGEL M 7.0 STRL (GLOVE) IMPLANT
GLOVE BIOGEL PI IND STRL 7.5 (GLOVE) ×5 IMPLANT
GLOVE BIOGEL PI IND STRL 8.5 (GLOVE) ×2 IMPLANT
GLOVE BIOGEL PI INDICATOR 7.5 (GLOVE) ×10
GLOVE BIOGEL PI INDICATOR 8.5 (GLOVE) ×4
GLOVE ORTHO TXT STRL SZ7.5 (GLOVE) ×3 IMPLANT
GLOVE SURG ORTHO 8.0 STRL STRW (GLOVE) ×6 IMPLANT
GOWN STRL REUS W/TWL LRG LVL3 (GOWN DISPOSABLE) ×9 IMPLANT
GOWN STRL REUS W/TWL XL LVL3 (GOWN DISPOSABLE) ×6 IMPLANT
HANDPIECE INTERPULSE COAX TIP (DISPOSABLE) ×3
MANIFOLD NEPTUNE II (INSTRUMENTS) ×3 IMPLANT
NS IRRIG 1000ML POUR BTL (IV SOLUTION) ×3 IMPLANT
PADDING CAST COTTON 6X4 STRL (CAST SUPPLIES) IMPLANT
POSITIONER SURGICAL ARM (MISCELLANEOUS) ×3 IMPLANT
PRESSURIZER FEMORAL UNIV (MISCELLANEOUS) ×3 IMPLANT
SET HNDPC FAN SPRY TIP SCT (DISPOSABLE) ×1 IMPLANT
SPONGE LAP 18X18 X RAY DECT (DISPOSABLE) IMPLANT
SPONGE LAP 4X18 X RAY DECT (DISPOSABLE) IMPLANT
STAPLER VISISTAT 35W (STAPLE) ×3 IMPLANT
SUCTION FRAZIER HANDLE 10FR (MISCELLANEOUS)
SUCTION TUBE FRAZIER 10FR DISP (MISCELLANEOUS) IMPLANT
SUT VIC AB 1 CT1 36 (SUTURE) ×9 IMPLANT
SUT VIC AB 2-0 CT1 27 (SUTURE) ×6
SUT VIC AB 2-0 CT1 TAPERPNT 27 (SUTURE) ×2 IMPLANT
TOWEL OR 17X26 10 PK STRL BLUE (TOWEL DISPOSABLE) ×6 IMPLANT
TOWER CARTRIDGE SMART MIX (DISPOSABLE) IMPLANT
TRAY FOLEY W/METER SILVER 16FR (SET/KITS/TRAYS/PACK) ×3 IMPLANT
WATER STERILE IRR 1500ML POUR (IV SOLUTION) ×6 IMPLANT
YANKAUER SUCT BULB TIP 10FT TU (MISCELLANEOUS) IMPLANT

## 2016-04-02 NOTE — Progress Notes (Signed)
PT Note  Patient Details Name: Alexander Hanna MRN: [id number] DOB: [birth year]   :     Checked on pt, per nursing staff they have just gotten him settled down, with some confusion and they will dangle patient later this evening. Will check back on patient in the  morning.    Clide Dales 04/02/2016, 6:18 PM  Clide Dales, PT Pager: [phone] 04/02/2016

## 2016-04-02 NOTE — Anesthesia Preprocedure Evaluation (Signed)
Anesthesia Evaluation  Patient identified by MRN, date of birth, ID band Patient awake    Reviewed: Allergy & Precautions, NPO status , Patient's Chart, lab work & pertinent test results  Airway Mallampati: I  TM Distance: >3 FB Neck ROM: Full    Dental   Pulmonary    Pulmonary exam normal        Cardiovascular Normal cardiovascular exam     Neuro/Psych Anxiety Depression    GI/Hepatic   Endo/Other    Renal/GU      Musculoskeletal   Abdominal   Peds  Hematology   Anesthesia Other Findings   Reproductive/Obstetrics                             Anesthesia Physical Anesthesia Plan  ASA: II  Anesthesia Plan: General   Post-op Pain Management:    Induction: Intravenous  Airway Management Planned: Oral ETT  Additional Equipment:   Intra-op Plan:   Post-operative Plan: Extubation in OR  Informed Consent: I have reviewed the patients History and Physical, chart, labs and discussed the procedure including the risks, benefits and alternatives for the proposed anesthesia with the patient or authorized representative who has indicated his/her understanding and acceptance.     Plan Discussed with: CRNA and Surgeon  Anesthesia Plan Comments:         Anesthesia Quick Evaluation

## 2016-04-02 NOTE — Anesthesia Postprocedure Evaluation (Signed)
Anesthesia Post Note  Patient: Alexander Hanna  Procedure(s) Performed: Procedure(s) (LRB): Resection left hip hemiarthroplasty- Girdlestone (Left)  Patient location during evaluation: PACU Anesthesia Type: General Level of consciousness: awake and alert Pain management: pain level controlled Vital Signs Assessment: post-procedure vital signs reviewed and stable Respiratory status: spontaneous breathing, nonlabored ventilation, respiratory function stable and patient connected to nasal cannula oxygen Cardiovascular status: blood pressure returned to baseline and stable Postop Assessment: no signs of nausea or vomiting Anesthetic complications: no       Last Vitals:  Vitals:   04/02/16 1215 04/02/16 1230  BP: (!) 95/55 (!) 90/59  Pulse: 63   Resp: 19   Temp:      Last Pain:  Vitals:   04/02/16 1120  TempSrc:   PainSc: [place] DAVID

## 2016-04-02 NOTE — Progress Notes (Addendum)
Pharmacy Antibiotic Note  Alexander Hanna is a 76 y.o. male admitted on 04/02/2016 with L hip prosthetic joint infection.  Pharmacy has been consulted for vancomycin and cefepime dosing.  Patient s/p resection and antibiotic spacer placement on 1/15; received 1000 mg preop x 1.  Note allergy listed to Amoxicillin but per family member is not allergic. Tolerated Ancef x 1 pre-op.  Plan:  Vancomycin 750 mg IV q12 hr; goal trough 15-20 mcg/mL  Measure vancomycin trough levels at steady state as indicated  Cefepime 2g IV q12 hr  Await operative specimen; can likely narrow to beta-lactam despite reported allergy, as long as MRSA absent.   Height: 5\' 6"  (167.6 cm) Weight: 150 lb (68 kg) IBW/kg (Calculated) : 63.8  Temp (24hrs), Avg:97.7 F (36.5 C), Min:97.5 F (36.4 C), Max:97.9 F (36.6 C)   Recent Labs Lab 03/27/16 1448  WBC 9.3  CREATININE 0.61    Estimated Creatinine Clearance: 69.8 mL/min (by C-G formula based on SCr of 0.61 mg/dL).    Allergies  Allergen Reactions  . Amoxicillin Other (See Comments)    Reaction unknown-provided via [place]. Pt's brother states he is not allergic to amoxicillin  . Avelox [Moxifloxacin Hcl In Nacl]   . Hydrocodone Other (See Comments)    Reaction unknown-provided via [place]  . Moxifloxacin Other (See Comments)    Reaction unknown-provided via Nursing Facility    Antimicrobials this admission: Vanc 1/15 >>  Cefepime 1/15 >> Ancef x 1 1/15  Dose adjustments this admission: ---  Microbiology results: none  Thank you for allowing pharmacy to be a part of this patient's care.  Reuel Boom, PharmD, BCPS Pager: [phone] 04/02/2016, 1:52 PM

## 2016-04-02 NOTE — Clinical Social Work Note (Signed)
Clinical Social Work Assessment  Patient Details  Name: Alexander Hanna MRN: [id number] Date of Birth: 1940-09-18  Date of referral:  04/02/16               Reason for consult:  Discharge Planning                Permission sought to share information with:  Facility Art therapist granted to share information::  Yes, Verbal Permission Granted  Name::        Agency::     Relationship::     Contact Information:     Housing/Transportation Living arrangements for the past 2 months:  [place] ([facility]) Source of Information:  Other (Comment Required) (Brother-James ) Patient Interpreter Needed:  None Criminal Activity/Legal Involvement Pertinent to Current Situation/Hospitalization:    Significant Relationships:  Siblings Alexander Hanna) Lives with:  Facility Resident Do you feel safe going back to the place where you live?  Yes Need for family participation in patient care:  Yes (Comment)  Care giving concerns: Patients brother, Alexander Hanna, did not have any concerns at this time.    Social Worker assessment / plan:  CSW spoke with patients brother, Alexander Hanna, regarding discharge plans. Patient is a current long-term resident at [facility]. Brother would like patient to return to their facility and stated patient could receive PT if necessary at facility. CSW will update AM CSW and plan for DC back to [facility].   Employment status:  Retired Forensic scientist:  Medicare PT Recommendations:  Not assessed at this time Information / Referral to community resources:  [place]  Patient/Family's Response to care:  Patients brother appreciated CSW.   Patient/Family's Understanding of and Emotional Response to Diagnosis, Current Treatment, and Prognosis:  Patient and family understand current treatment  And prognosis.   Emotional Assessment Appearance:  Appears stated age Attitude/Demeanor/Rapport:  Unable to Assess Affect (typically observed):   Unable to Assess Orientation:  Oriented to Self Alcohol / Substance use:    Psych involvement (Current and /or in the community):  No (Comment)  Discharge Needs  Concerns to be addressed:  No discharge needs identified Readmission within the last 30 days:  No Current discharge risk:  None Barriers to Discharge:  No Barriers Identified   Alexander Anna, LCSW 04/02/2016, 6:19 PM

## 2016-04-02 NOTE — Anesthesia Procedure Notes (Signed)
Procedure Name: Intubation Date/Time: 04/02/2016 9:58 AM Performed by: Lind Covert Pre-anesthesia Checklist: Patient identified, Timeout performed, Emergency Drugs available, Patient being monitored and Suction available Patient Re-evaluated:Patient Re-evaluated prior to inductionOxygen Delivery Method: Circle system utilized Preoxygenation: Pre-oxygenation with 100% oxygen Intubation Type: IV induction Ventilation: Mask ventilation without difficulty Laryngoscope Size: Mac and 4 Grade View: Grade I Tube type: Oral Tube size: 7.5 mm Number of attempts: 1 Airway Equipment and Method: Stylet Placement Confirmation: ETT inserted through vocal cords under direct vision,  breath sounds checked- equal and bilateral and positive ETCO2 Secured at: 22 cm Tube secured with: Tape Dental Injury: Teeth and Oropharynx as per pre-operative assessment

## 2016-04-02 NOTE — Transfer of Care (Signed)
Immediate Anesthesia Transfer of Care Note  Patient: Alexander Hanna  Procedure(s) Performed: Procedure(s): Resection left hip hemiarthroplasty- Girdlestone (Left)  Patient Location: PACU  Anesthesia Type:General  Level of Consciousness: sedated  Airway & Oxygen Therapy: Patient Spontanous Breathing and Patient connected to face mask oxygen  Post-op Assessment: Report given to RN and Post -op Vital signs reviewed and stable  Post vital signs: Reviewed and stable  Last Vitals:  Vitals:   04/02/16 0720  BP: 121/64  Pulse: 70  Resp: 18  Temp: 36.6 C    Last Pain:  Vitals:   04/02/16 0755  TempSrc:   PainSc: 3       Patients Stated Pain Goal: 4 (48/47/20 7218)  Complications: No apparent anesthesia complications

## 2016-04-02 NOTE — NC FL2 (Signed)
[place] LEVEL OF CARE SCREENING TOOL     IDENTIFICATION  Patient Name: Alexander Hanna Birthdate: 1940-10-24 Sex: male Admission Date (Current Location): 04/02/2016  [facility] and [place] Number:  Herbalist and Address:  [facility],  [place] [address] [place]      Provider Number: [id number]  Attending Physician Name and Address:  Paralee Cancel, MD  Relative Name and Phone Number:       Current Level of Care: Hospital Recommended Level of Care: [place] Prior Approval Number:    Date Approved/Denied:   PASRR Number:   [id number]  Discharge Plan: SNF    Current Diagnoses: Patient Active Problem List   Diagnosis Date Noted  . S/P Girdlestone left hip 04/02/2016  . S/P hip replacement 04/02/2016  . S/P percutaneous endoscopic gastrostomy (PEG) tube placement ([place]) 03/08/2015  . Protein-calorie malnutrition ([place]) 01/06/2014  . Dysphagia, unspecified(787.20) 12/11/2013  . Unspecified protein-calorie malnutrition 12/11/2013  . Rectal bleeding 12/18/2012  . Unspecified constipation 12/18/2012  . Leukocytosis, unspecified 12/07/2012  . Ambulatory dysfunction 11/22/2012  . Fall 11/22/2012  . Frequent falls 11/22/2012  . Compression fracture of L1 lumbar vertebra ([place]) 11/22/2012  . Unspecified hypothyroidism 11/22/2012  . Difficulty in walking(719.7) 09/28/2010  . Stiffness of joint, not elsewhere classified, pelvic region and thigh 09/28/2010  . KNEE, ARTHRITIS, DEGEN./OSTEO 05/31/2009  . ARTHRITIS, RIGHT HIP 05/31/2009  . SPINAL STENOSIS 05/31/2009  . OSTEOARTHRITIS, HIP, RIGHT 06/17/2008  . HIP PAIN 06/17/2008    Orientation RESPIRATION BLADDER Height & Weight     Self  O2 (2L) Incontinent Weight: 150 lb (68 kg) Height:  5\' 6"  (167.6 cm)  BEHAVIORAL SYMPTOMS/MOOD NEUROLOGICAL BOWEL NUTRITION STATUS      Continent Diet (Clear liquid)  AMBULATORY STATUS COMMUNICATION OF NEEDS Skin   Limited Assist  Verbally Other (Comment) (Closed Incision-left hip)                       Personal Care Assistance Level of Assistance  Bathing, Dressing, Feeding Bathing Assistance: Limited assistance Feeding assistance: Independent Dressing Assistance: Limited assistance     Functional Limitations Info             [place]  PT (By licensed PT), OT (By licensed OT)     PT Frequency: 5 OT Frequency: 5            Contractures      Additional Factors Info  Code Status Code Status Info: Full Code Allergies Info: AMOXICILLIN, AVELOX MOXIFLOXACIN HCL IN NACL, HYDROCODONE, MOXIFLOXACIN            Current Medications (04/02/2016):  This is the current hospital active medication list Current Facility-Administered Medications  Medication Dose Route Frequency Provider Last Rate Last Dose  . acetaminophen (TYLENOL) tablet 1,000 mg  1,000 mg Oral Q8H Danae Orleans, PA-C   1,000 mg at 04/02/16 1508  . acidophilus (RISAQUAD) capsule 1 capsule  1 capsule Oral BID Danae Orleans, PA-C   1 capsule at 04/02/16 1519  . alum & mag hydroxide-simeth (MAALOX/MYLANTA) 200-200-20 MG/5ML suspension 30 mL  30 mL Oral Q4H PRN Danae Orleans, PA-C      . aspirin chewable tablet 81 mg  81 mg Oral BID Danae Orleans, PA-C      . atorvastatin (LIPITOR) tablet 10 mg  10 mg Oral q1800 Danae Orleans, PA-C      . bisacodyl (DULCOLAX) suppository 10 mg  10 mg Rectal Daily  PRN Danae Orleans, PA-C      . ceFEPIme (MAXIPIME) 2 g in dextrose 5 % 50 mL IVPB  2 g Intravenous Q12H Polly Cobia, RPH      . celecoxib (CELEBREX) capsule 200 mg  200 mg Oral Q12H Danae Orleans, PA-C      . Derrill Memo ON 04/03/2016] dexamethasone (DECADRON) injection 10 mg  10 mg Intravenous Once Danae Orleans, PA-C      . diphenhydrAMINE (BENADRYL) capsule 25 mg  25 mg Oral Q6H PRN Danae Orleans, PA-C      . docusate sodium (COLACE) capsule 100 mg  100 mg Oral BID Danae Orleans, PA-C   100 mg at 04/02/16 1519  .  ezetimibe (ZETIA) tablet 10 mg  10 mg Oral Daily Danae Orleans, PA-C   10 mg at 04/02/16 1509  . feeding supplement (ENSURE ENLIVE) (ENSURE ENLIVE) liquid 120 mL  120 mL Oral BID Danae Orleans, PA-C   120 mL at 04/02/16 1515  . ferrous sulfate tablet 325 mg  325 mg Oral TID PC Danae Orleans, PA-C      . finasteride (PROSCAR) tablet 5 mg  5 mg Oral Daily Danae Orleans, PA-C   5 mg at 04/02/16 1509  . fluticasone (FLONASE) 50 MCG/ACT nasal spray 2 spray  2 spray Each Nare BID Danae Orleans, PA-C   2 spray at 04/02/16 1511  . guaiFENesin (MUCINEX) 12 hr tablet 600 mg  600 mg Oral BID Danae Orleans, PA-C   600 mg at 04/02/16 1509  . HYDROmorphone (DILAUDID) injection 0.5-1 mg  0.5-1 mg Intravenous Q2H PRN Danae Orleans, PA-C      . levothyroxine (SYNTHROID, LEVOTHROID) tablet 150 mcg  150 mcg Oral QHS Danae Orleans, PA-C      . loratadine (CLARITIN) tablet 10 mg  10 mg Oral q1800 Danae Orleans, PA-C      . magnesium citrate solution 1 Bottle  1 Bottle Oral Once PRN Danae Orleans, PA-C      . menthol-cetylpyridinium (CEPACOL) lozenge 3 mg  1 lozenge Oral PRN Danae Orleans, PA-C       Or  . phenol (CHLORASEPTIC) mouth spray 1 spray  1 spray Mouth/Throat PRN Danae Orleans, PA-C      . methocarbamol (ROBAXIN) tablet 500 mg  500 mg Oral Q6H PRN Danae Orleans, PA-C       Or  . methocarbamol (ROBAXIN) 500 mg in dextrose 5 % 50 mL IVPB  500 mg Intravenous Q6H PRN Danae Orleans, PA-C   500 mg at 04/02/16 1147  . metoCLOPramide (REGLAN) tablet 5-10 mg  5-10 mg Oral Q8H PRN Danae Orleans, PA-C       Or  . metoCLOPramide (REGLAN) injection 5-10 mg  5-10 mg Intravenous Q8H PRN Danae Orleans, PA-C      . niacin (NIASPAN) CR tablet 500 mg  500 mg Oral QHS Danae Orleans, PA-C      . ondansetron [facility]) tablet 4 mg  4 mg Oral Q6H PRN Danae Orleans, PA-C       Or  . ondansetron [facility]) injection 4 mg  4 mg Intravenous Q6H PRN Danae Orleans, PA-C      . pantoprazole (PROTONIX) EC tablet 40 mg   40 mg Oral Daily Danae Orleans, PA-C   40 mg at 04/02/16 1509  . polyethylene glycol (MIRALAX / GLYCOLAX) packet 17 g  17 g Oral BID Danae Orleans, PA-C   17 g at 04/02/16 1510  . polyvinyl alcohol (LIQUIFILM TEARS) 1.4 % ophthalmic solution 1 drop  1 drop Both Eyes QID PRN Paralee Cancel, MD   1 drop at 04/02/16 1511  . sodium chloride 0.9 % 1,000 mL with potassium chloride 10 mEq infusion  100 mL/hr Intravenous Continuous Danae Orleans, PA-C 100 mL/hr at 04/02/16 1516 100 mL/hr at 04/02/16 1516  . tamsulosin (FLOMAX) capsule 0.4 mg  0.4 mg Oral Daily Danae Orleans, PA-C   0.4 mg at 04/02/16 1509  . traMADol (ULTRAM) tablet 50-100 mg  50-100 mg Oral Q6H Danae Orleans, PA-C   100 mg at 04/02/16 1514  . traZODone (DESYREL) tablet 25 mg  25 mg Oral QHS Danae Orleans, PA-C      . vancomycin (VANCOCIN) IVPB 750 mg/150 ml premix  750 mg Intravenous Q12H Polly Cobia, RPH      . vitamin C (ASCORBIC ACID) tablet 500 mg  500 mg Oral Daily Danae Orleans, PA-C         Discharge Medications: Please see discharge summary for a list of discharge medications.  Relevant Imaging Results:  Relevant Lab Results:   Additional Information SS#: [id number]  [name], LCSW

## 2016-04-02 NOTE — Consult Note (Signed)
[place] for Infectious Disease  Total days of antibiotics 1        Day 1 cefazolin        Day 1 vanco               Reason for Consult: chronic prosthetic joint infection    Referring Physician: olin  Principal Problem:   S/P Girdlestone left hip Active Problems:   S/P hip replacement    HPI: Alexander Hanna is a 76 y.o. 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 Cx + staph aureus and treated with IV doxycycline at that time. Family reports he was treated numerous times for MRSA wound infection also requiring wound vac treatment with IV abtx. Per our records, he had Wound cx from April 2015, showed pan sensitive PsA isolated. He was treated through SNF at this time. Most recently sustained his left leg with significant contracture is interfering with other medical problems. He was admitted electively for excisional total hip arthroplasty with abtx spacer. girdlestone procedure to remove nidus of infection and improve range of motion of his leg. Dr Alvan Dame reports that no significant purulent fluid found in joint. No cx sent.  Past Medical History:  Diagnosis Date  . Anterior dislocation of left hip ([place])   . Anxiety   . Depression   . Environmental allergies   . Headache    brain damaged at age 41   . Hypothyroidism   . Incontinent of urine    wears a diaper per brother   . 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 Overactive bladder   . Polio    as a child    Allergies:  Allergies  Allergen Reactions  . Amoxicillin Other (See Comments)    Reaction unknown-provided via [place]. Pt's brother states he is not allergic to amoxicillin  . Avelox [Moxifloxacin Hcl In Nacl]   . Hydrocodone Other (See Comments)    Reaction unknown-provided via [place]    . Moxifloxacin Other (See Comments)    Reaction unknown-provided via Nursing Facility    MEDICATIONS: . acetaminophen  1,000 mg Oral Q8H  . acidophilus  1 capsule Oral BID  . aspirin  81 mg Oral BID  . atorvastatin  10 mg Oral q1800  . celecoxib  200 mg Oral Q12H  . [START ON 04/03/2016] dexamethasone  10 mg Intravenous Once  . docusate sodium  100 mg Oral BID  . ezetimibe  10 mg Oral Daily  . feeding supplement (ENSURE ENLIVE)  120 mL Oral BID  . ferrous sulfate  325 mg Oral TID PC  . finasteride  5 mg Oral Daily  . fluticasone  2 spray Each Nare BID  . guaiFENesin  600 mg Oral BID  . levothyroxine  150 mcg Oral QHS  . loratadine  10 mg Oral q1800  . niacin  500 mg Oral QHS  . pantoprazole  40 mg Oral Daily  . polyethylene glycol  17 g Oral BID  . tamsulosin  0.4 mg Oral Daily  . traMADol  50-100 mg Oral Q6H  . traZODone  25 mg Oral QHS  . ascorbic acid  500 mg Oral Daily    Social History  Substance Use  Topics  . Smoking status: Never Smoker  . Smokeless tobacco: Never Used     Comment: Never smoked much  . Alcohol use No    Family History  Problem Relation Age of Onset  . Lung cancer Mother   . Heart attack Father   . Angina Brother   . Coronary artery disease Brother     Review of Systems -  Mild pain to left leg. Otherwise 10 point ros is negative  OBJECTIVE: Temp:  [97.5 F (36.4 C)-97.9 F (36.6 C)] 97.9 F (36.6 C) (01/15 1316) Pulse Rate:  [61-88] 67 (01/15 1316) Resp:  [13-33] 16 (01/15 1316) BP: (90-121)/(50-81) 95/50 (01/15 1316) SpO2:  [96 %-100 %] 96 % (01/15 1316) Weight:  [150 lb (68 kg)] 150 lb (68 kg) (01/15 0755) Physical Exam  Constitutional: He is oriented to person, only. He appears well-developed and well-nourished. No distress. Markedly pale.sitting up feeding himself HENT:  Mouth/Throat: Oropharynx is clear and moist. No oropharyngeal exudate.  Cardiovascular: Normal rate, regular rhythm and normal heart sounds. Exam reveals no  gallop and no friction rub.  No murmur heard.  Pulmonary/Chest: Effort normal and breath sounds normal. No respiratory distress. He has no wheezes.  Abdominal: Soft. Bowel sounds are normal. He exhibits no distension. There is no tenderness.  Lymphadenopathy:  He has no cervical adenopathy.  Neurological: He is alert and oriented to person, moves upper extremities, spastic lower extremities Skin: Skin is warm and dry. No rash noted. No erythema.  Psychiatric: He has a normal mood and affect. His behavior is normal.    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 POD#0 s/p excisional total hip arthroplasty with antibiotic spacer/girdlestone procedure on 1/15  - would treat 6 wk of iv vanco and cefepime. - will check sed rate and crp - will place order for picc line - will check cbc and bmp in order to dose vancomycin  Plan to get IV abtx through his rehab facility. Orders listed below  ------------------------- Diagnosis: MRSA PJI of left THA  Culture Result: historically had MRSA and pseudomonas  Allergies  Allergen Reactions  . Amoxicillin Other (See Comments)    Reaction unknown-provided via Nursing Facility. Pt's brother states he is not allergic to amoxicillin  . Avelox [Moxifloxacin Hcl In Nacl]   . Hydrocodone Other (See Comments)    Reaction unknown-provided via Nursing Facility  . Moxifloxacin Other (See Comments)    Reaction unknown-provided via Nursing Facility    Discharge antibiotics: Per pharmacy protocol  vanco and cefepime Aim for Vancomycin trough 15-20 (unless otherwise indicated) Duration: 6 wk End Date: Feb 26th  PIC Care Per Protocol:  Labs weekly while on IV antibiotics: _x_ CBC with differential __x BMP twice a week __ CMP _x_ CRP in the last 2 wk of abtx __x ESR in the last 2 wk of abtx _x_ Vancomycin trough  _x Please pull PIC at completion of IV  antibiotics __ Please leave PIC in place until doctor has seen patient or been notified  Fax weekly labs to [phone]  Clinic Follow Up Appt: 4-6 wk  @  

## 2016-04-02 NOTE — Interval H&P Note (Signed)
History and Physical Interval Note:  04/02/2016 9:05 AM  Alexander Hanna  has presented today for surgery, with the diagnosis of Infected left hip hemiarthroplasty  The various methods of treatment have been discussed with the patient and family. After consideration of risks, benefits and other options for treatment, the patient has consented to  Procedure(s): Resection left hip hemiarthroplasty- Girdlestone EXCISIONAL TOTAL HIP ARTHROPLASTY WITH ANTIBIOTIC SPACERS (Left) as a surgical intervention .  The patient's history has been reviewed, patient examined, no change in status, stable for surgery.  I have reviewed the patient's chart and labs.  Questions were answered to the patient's satisfaction.     Mauri Pole

## 2016-04-02 NOTE — Progress Notes (Signed)
Peripherally Inserted Central Catheter/Midline Placement  The IV Nurse has discussed with the patient and/or persons authorized to consent for the patient, the purpose of this procedure and the potential benefits and risks involved with this procedure.  The benefits include less needle sticks, lab draws from the catheter, and the patient may be discharged home with the catheter. Risks include, but not limited to, infection, bleeding, blood clot (thrombus formation), and puncture of an artery; nerve damage and irregular heartbeat and possibility to perform a PICC exchange if needed/ordered by physician.  Alternatives to this procedure were also discussed.  Bard Power PICC patient education guide, fact sheet on infection prevention and patient information card has been provided to patient /or left at bedside.    PICC/Midline Placement Documentation  PICC Single Lumen 56/70/14 PICC Right Basilic 33 cm 0 cm (Active)  Indication for Insertion or Continuance of Line Home intravenous therapies (PICC only) 04/02/2016  8:20 PM  Exposed Catheter (cm) 0 cm 04/02/2016  8:20 PM  Site Assessment Clean;Dry;Intact 04/02/2016  8:20 PM  Line Status Flushed;Saline locked;Blood return noted 04/02/2016  8:20 PM  Dressing Type Transparent 04/02/2016  8:20 PM  Dressing Status Clean;Dry;Intact 04/02/2016  8:20 PM  Dressing Intervention New dressing 04/02/2016  8:20 PM  Dressing Change Due 04/09/16 04/02/2016  8:20 PM       Gordan Payment 04/02/2016, 8:22 PM

## 2016-04-03 LAB — CBC
HCT: 27.7 % — ABNORMAL LOW (ref 39.0–52.0)
HEMOGLOBIN: 9 g/dL — AB (ref 13.0–17.0)
MCH: 28.8 pg (ref 26.0–34.0)
MCHC: 32.5 g/dL (ref 30.0–36.0)
MCV: 88.5 fL (ref 78.0–100.0)
Platelets: 271 10*3/uL (ref 150–400)
RBC: 3.13 MIL/uL — ABNORMAL LOW (ref 4.22–5.81)
RDW: 13.6 % (ref 11.5–15.5)
WBC: 9.7 10*3/uL (ref 4.0–10.5)

## 2016-04-03 LAB — BASIC METABOLIC PANEL
ANION GAP: 6 (ref 5–15)
BUN: 18 mg/dL (ref 6–20)
CO2: 25 mmol/L (ref 22–32)
CREATININE: 0.56 mg/dL — AB (ref 0.61–1.24)
Calcium: 7.9 mg/dL — ABNORMAL LOW (ref 8.9–10.3)
Chloride: 98 mmol/L — ABNORMAL LOW (ref 101–111)
Glucose, Bld: 139 mg/dL — ABNORMAL HIGH (ref 65–99)
Potassium: 4.8 mmol/L (ref 3.5–5.1)
SODIUM: 129 mmol/L — AB (ref 135–145)

## 2016-04-03 MED ORDER — SODIUM CHLORIDE 0.9 % IV BOLUS (SEPSIS)
250.0000 mL | Freq: Once | INTRAVENOUS | Status: AC
  Administered 2016-04-03: 250 mL via INTRAVENOUS

## 2016-04-03 NOTE — Progress Notes (Signed)
Patient ID: Alexander Hanna, male   DOB: 21-Dec-1940, 76 y.o.   MRN: [id number] Subjective: 1 Day Post-Op Procedure(s) (LRB): Resection left hip hemiarthroplasty- Girdlestone (Left)    Patient not complaining of pain, and no major events reported over night  Objective:   VITALS:   Vitals:   04/03/16 0151 04/03/16 0628  BP: (!) 95/48 (!) 89/43  Pulse: 64 61  Resp: 16 16  Temp: 97.7 F (36.5 C) 97.7 F (36.5 C)    Neurovascular intact Incision: dressing C/D/I  LABS  Recent Labs  04/02/16 1605 04/03/16 0338  HGB 10.6* 9.0*  HCT 32.4* 27.7*  WBC 9.3 9.7  PLT 276 271     Recent Labs  04/02/16 1605 04/03/16 0338  NA 132* 129*  K 4.2 4.8  BUN 10 18  CREATININE 0.61 0.56*  GLUCOSE 171* 139*    No results for input(s): LABPT, INR in the last 72 hours.   Assessment/Plan: 1 Day Post-Op Procedure(s) (LRB): Resection left hip hemiarthroplasty- Girdlestone (Left)   Discharge to SNF perhaps today if possible Rehab in [facility] in [place]

## 2016-04-03 NOTE — Evaluation (Signed)
Occupational Therapy Evaluation Patient Details Name: Alexander Hanna MRN: [id number] DOB: [birth year] Today's Date: 04/03/2016    History of Present Illness 76 y.o. male admitted for resection of L hip hemiarthroplasty. PMH of skull fracture with brain injury age 89, chronic L hip dislocation and infection   Clinical Impression   Pt admitted as above. 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 He requires a mechanical lift (maximove) for transfers. This is most likely his baseline as he is from SNF, and per chart review, plans to return to SNF, No family present to provide prior level of function and pt unable to state or assist with this. Will sign off OT as pt appears to be at baseline level of care for ADL's and functional mobility.    Follow Up Recommendations  No OT follow up;Supervision/Assistance - 24 hour;SNF (From SNF, plans to return to SNF per chart review. Suspect pt is at baseline level)    Equipment Recommendations  None recommended by OT    Recommendations for Other Services       Precautions / Restrictions Precautions Precautions: Fall Restrictions Weight Bearing Restrictions: No LLE Weight Bearing: Weight bearing as tolerated      Mobility Bed Mobility Overal bed mobility: Needs Assistance Bed Mobility: Sidelying to Sit;Rolling Rolling: +2 for physical assistance;Max assist Sidelying to sit: Max assist;+2 for physical assistance       General bed mobility comments: max A to roll and to raise trunk, pt 30%  Transfers                 General transfer comment: +2 total assist for lift via maximove    Balance Overall balance assessment: Needs assistance Sitting-balance support: Bilateral upper extremity supported Sitting balance-Leahy Scale: Zero Sitting balance - Comments: varies from min to heavy +2 mod, posterior lean, feet not supported 2* contractures                                     ADL Overall ADL's : At baseline                                       General ADL Comments: Pt is from a SNF and per chart review, plans to return to SNF. He was a co-treatment w/ PT/OT this morning for functional mobility/bed mobility. 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 See transfer/mobility section of assessment. Pt was overall Max assist supine to sit; Heavy Mod A for sitting balance sitting EOB - unable to get feet on floor. Bed-chair transfer w/ mechanical lift (this is likely his baseline as there is a w/c in his room with a lift pad in it that came from his SNF), No family present and pt unable to provide PLOF - prior level of function).Pt is currently Max-total assist for all ADL's and self care tasks, most likely non-ambulatory and at his baseline level. Will sign off acute OT.     Vision  Unknown, pt unable to provide PLOF (prior level of function).   Perception     Praxis      Pertinent Vitals/Pain Pain Assessment: Faces Faces Pain Scale: Hurts even more Pain Location: grimacing with movement of L hip Pain  Descriptors / Indicators: Grimacing Pain Intervention(s): Limited activity within patient's tolerance;Monitored during session;Premedicated before session;Repositioned;Ice applied     Hand Dominance Right   Extremity/Trunk Assessment Upper Extremity Assessment Upper Extremity Assessment: Overall WFL for tasks assessed;Difficult to assess due to impaired cognition   Lower Extremity Assessment Lower Extremity Assessment: Defer to PT evaluation   Cervical / Trunk Assessment Cervical / Trunk Assessment: Kyphotic   Communication Communication Communication: HOH;Expressive difficulties (HOH with garbled speech)   Cognition Arousal/Alertness: Awake/alert Behavior During Therapy: WFL for tasks assessed/performed (Very HOH; with hearing aid use) Overall Cognitive Status: History of cognitive  impairments - at baseline (No family present)                     General Comments       Exercises       Shoulder Instructions      Home Living Family/patient expects to be discharged to:: Skilled nursing facility Living Arrangements: Other (Comment) (SNF)                                      Prior Functioning/Environment Level of Independence: Needs assistance  Gait / Transfers Assistance Needed: Pt not able to provide prior functional history, pt has manual WC with a pad for mechanical lift in it, assume pt was non ambulatory PTA as he also has LE contractures              OT Problem List:     OT Treatment/Interventions:      OT Goals(Current goals can be found in the care plan section) Acute Rehab OT Goals Patient Stated Goal: none stated; D/c therapy  OT Frequency:     Barriers to D/C:            Co-evaluation PT/OT/SLP Co-Evaluation/Treatment: Yes Reason for Co-Treatment: Complexity of the patient's impairments (multi-system involvement);For patient/therapist safety;To address functional/ADL transfers PT goals addressed during session: Mobility/safety with mobility;Balance OT goals addressed during session: ADL's and self-care      End of Session Equipment Utilized During Treatment: Other (comment) (Maximove for lift ) Nurse Communication: Need for lift equipment;Other (comment) (Pt wondering if he has a fever)  Activity Tolerance: Patient tolerated treatment well Patient left: in chair;with call bell/phone within reach;with chair alarm set   Time: 3832-9191 OT Time Calculation (min): 34 min Charges:  OT Evaluation $OT Eval Moderate Complexity: 1 Procedure G-Codes:    Almyra Deforest, OTR/L 04/03/2016, 12:26 PM

## 2016-04-03 NOTE — Op Note (Signed)
NAME:  WOODRUFF, SKIRVIN NO.:  [id number]  MEDICAL RECORD NO.:  [id number]  LOCATION:                                 FACILITY:  PHYSICIAN:  Pietro Cassis. Alvan Dame, M.D.  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  PROCEDURE:  Resection of infected left hip hemiarthroplasty with Girdlestone procedure at this point.  SURGEON:  Pietro Cassis. Alvan Dame, M.D.  ASSISTANT:  Danae Orleans, PA  ANESTHESIA:  General.  BLOOD LOSS:  About 200 mL.  DRAINS:  None.  COMPLICATION:  None.  CULTURES:  The patient had open draining wound.  There was no significant fluid inside his wound to send for cultures, thus none were swabbed.  He had previous treatment in the [place] area.  We will consult Infectious Disease for their help in management.  INDICATIONS FOR PROCEDURE:  Mr. 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  He presented to the office with his brother after not being seen for some time after I had performed hip replacement surgery on his right hip.  He had a fall and had a femoral neck fracture that resulted in hemiarthroplasty with the reports of chronic infection and dislocation.  They wished to be evaluated in the office.  At that point in time, I have discussed with his brother, who is his primary caregiver that the only procedure could be performed to provide an adequate benefit for him based on his quality of life and functional benefit as a Girdlestone procedure resecting the infected hip, allowing his  wound to heal hopefully.  Risks, benefits, and necessity of the procedure were discussed.  The postoperative course including IV antibiotics, possibility were discussed reviewed.  Consent was obtained.  PROCEDURE IN DETAIL:  The patient was brought to the operative theater. Once adequate anesthesia, preoperative antibiotics, Ancef and vancomycin administered.  He was positioned into the right lateral decubitus position with the left side up.  The left lower extremity was then prepped and draped in sterile fashion.  His posterior lateral incision was identified.  It had significant puckering at a couple of different areas as well as an area with persistent serous drainage.  There was no foul odor.  I demarcated the skin and then performed a time- out identifying the patient, planned procedure, and extremity.  At this point, I made a thin incision, excising this whole area that was indented and contracted and inverted.  Soft tissue planes were recreated as best as possible trying to identify the iliotibial band and gluteal fascia, which was scarred down onto the subcutaneous skin region as well as down on the bone.  Once they were identified, I incised this area all the way down and then medially identified the femoral head from the arthroplasty  very prominent within the gluteus musculature.  I was also identified the femoral stem was very loose as was expected. At this point, after debridement of scar around the hip area, we were able to remove the femoral head and the femoral stem without difficulty or complication.  I used a large curette to debride fibrous tissue within the femoral canal.  We then irrigated the canal with pulse lavage canal brush irrigator.  After further debridements within the area of the lateral aspect of the ilium as the patient's acetabulum was completely covered with scar, I did not spend time to dissect this out based on the plan Girdlestone procedure.   Then, the remainder of the wound was irrigated with about 2 L of normal saline solution with a pulse lavage.  At this point, with resection of the femoral component, the soft tissue dissection had been performed, allowed for a relatively easier reapproximated the iliotibial band and gluteal fascia, which was done with #1 Vicryl.  I then reapproximated skin edges with 2-0 Vicryl and staples on the skin.  Again, the soft tissue planes were each recreated from the skin, subcutaneous layer to the fascia layers allowing reapproximation of the skin.  Our hope at this point is that his wound will heal without complication, allowing the skin edges to heal in a normal position, prevent further issues.  Upon final closure of the skin with staples, the hip was cleaned, dried, and dressed sterilely using Mepilex dressing.  He was then brought to the recovery room in stable condition, tolerating the procedure well.  Findings will be reviewed with his brother and primary caregiver.  We will have Infectious Disease consult for help in management.     Pietro Cassis Alvan Dame, M.D.     MDO/MEDQ  D:  04/02/2016  T:  04/03/2016  Job:  [id number]

## 2016-04-03 NOTE — Plan of Care (Signed)
Problem: Education: Goal: Knowledge of [facility] Education information/materials will improve Outcome: Not Met (add Reason) Hx: Brain damage  Problem: Health Behavior/Discharge Planning: Goal: Ability to manage health-related needs will improve Outcome: Not Applicable Date Met: 24/23/53 Dependent care  Problem: Activity: Goal: Risk for activity intolerance will decrease Outcome: Not Met (add Reason) Has been wheelchair bound

## 2016-04-03 NOTE — Care Management Note (Signed)
Case Management Note  Patient Details  Name: RAED SCHALK MRN: [id number] Date of Birth: June 28, 1940  Subjective/Objective:                  EXCISIONAL TOTAL HIP ARTHROPLASTY WITH ANTIBIOTIC SPACERS Action/Plan: Discharge planning Expected Discharge Date:  04/04/16               Expected Discharge Plan:  [place]  In-House Referral:     Discharge planning Services  CM Consult  Post Acute Care Choice:  NA Choice offered to:  NA  DME Arranged:  N/A DME Agency:  NA  HH Arranged:  NA HH Agency:  NA  Status of Service:  Completed, signed off  If discussed at [place] of Stay Meetings, dates discussed:    Additional Comments: CM notes pt to go to SNF; CSW awarea and arranging. No other Cm needs were communicated. Dellie Catholic, RN 04/03/2016, 12:56 PM

## 2016-04-03 NOTE — Evaluation (Signed)
Physical Therapy Evaluation Patient Details Name: Alexander Hanna MRN: [id number] DOB: [birth year] Today's Date: 04/03/2016   History of Present Illness  76 y.o. male admitted for resection of L hip hemiarthroplasty. PMH of skull fracture with brain injury age 11, chronic L hip dislocation and infection  Clinical Impression  Pt admitted with above diagnosis. Pt currently with functional limitations due to the deficits listed below (see PT Problem List). Max assist for supine to sit, heavy mod assist for sitting balance, bed to chair transfer with mechanical lift (which is likely his baseline, pt unable to provide prior functional hx, no family present). Pt will benefit from skilled PT to increase their independence and safety with mobility to allow discharge to the venue listed below.       Follow Up Recommendations SNF;Supervision/Assistance - 24 hour    Equipment Recommendations  None recommended by PT    Recommendations for Other Services OT consult     Precautions / Restrictions Precautions Precautions: Fall Restrictions Weight Bearing Restrictions: No LLE Weight Bearing: Weight bearing as tolerated      Mobility  Bed Mobility Overal bed mobility: Needs Assistance Bed Mobility: Sidelying to Sit;Rolling Rolling: +2 for physical assistance;Max assist Sidelying to sit: Max assist;+2 for physical assistance       General bed mobility comments: max A to roll and to raise trunk, pt 30%  Transfers                 General transfer comment: +2 assist for lift via maximove  Ambulation/Gait                Stairs            Wheelchair Mobility    Modified Rankin (Stroke Patients Only)       Balance Overall balance assessment: Needs assistance Sitting-balance support: Bilateral upper extremity supported Sitting balance-Leahy Scale: Zero Sitting balance - Comments: varies from min to heavy +2 mod, posterior lean, feet not supported 2* contractures                                      Pertinent Vitals/Pain Pain Assessment: Faces Faces Pain Scale: Hurts even more Pain Location: grimacing with movement of L hip Pain Descriptors / Indicators: Grimacing Pain Intervention(s): Limited activity within patient's tolerance;Monitored during session;Premedicated before session;Repositioned;Ice applied    Home Living Family/patient expects to be discharged to:: Skilled nursing facility                      Prior Function Level of Independence: Needs assistance   Gait / Transfers Assistance Needed: pt not able to provide prior functional history, pt has manual WC with a pad for mechanical lift in it, assume pt was non ambulatory PTA as he also has LE contractures           Hand Dominance        Extremity/Trunk Assessment        Lower Extremity Assessment Lower Extremity Assessment:  (no functional movement of BLEs, 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)    Cervical / Trunk Assessment Cervical / Trunk Assessment: Kyphotic  Communication   Communication: HOH;Expressive difficulties (HOH with hearing aides, garbled speech)  Cognition Arousal/Alertness: Awake/alert Behavior During Therapy: WFL for tasks assessed/performed Overall Cognitive Status: History of cognitive impairments - at baseline  General Comments      Exercises     Assessment/Plan    PT Assessment Patient needs continued PT services  PT Problem List Decreased balance;Decreased activity tolerance;Decreased mobility;Decreased range of motion;Pain;Decreased cognition          PT Treatment Interventions Therapeutic exercise;Therapeutic activities;Patient/family education;Functional mobility training;Balance training    PT Goals (Current goals can be found in the Care Plan section)  Acute Rehab PT Goals Patient Stated Goal: none stated PT Goal Formulation: Patient unable to participate in goal  setting Time For Goal Achievement: 04/17/16 Potential to Achieve Goals: Good    Frequency Min 2X/week   Barriers to discharge        Co-evaluation PT/OT/SLP Co-Evaluation/Treatment: Yes Reason for Co-Treatment: Complexity of the patient's impairments (multi-system involvement);For patient/therapist safety;To address functional/ADL transfers PT goals addressed during session: Mobility/safety with mobility;Balance         End of Session   Activity Tolerance: Patient tolerated treatment well Patient left: in chair;with call bell/phone within reach;with chair alarm set Nurse Communication: Mobility status;Need for lift equipment         Time: 1017-1053 PT Time Calculation (min) (ACUTE ONLY): 36 min   Charges:   PT Evaluation $PT Eval High Complexity: 1 Procedure     PT G Codes:        Blondell Reveal Kistler 04/03/2016, 11:13 AM [phone]

## 2016-04-04 LAB — BASIC METABOLIC PANEL
Anion gap: 5 (ref 5–15)
BUN: 17 mg/dL (ref 6–20)
CALCIUM: 8.2 mg/dL — AB (ref 8.9–10.3)
CO2: 26 mmol/L (ref 22–32)
CREATININE: 0.43 mg/dL — AB (ref 0.61–1.24)
Chloride: 101 mmol/L (ref 101–111)
GLUCOSE: 83 mg/dL (ref 65–99)
Potassium: 4.7 mmol/L (ref 3.5–5.1)
Sodium: 132 mmol/L — ABNORMAL LOW (ref 135–145)

## 2016-04-04 LAB — CBC
HEMATOCRIT: 26 % — AB (ref 39.0–52.0)
Hemoglobin: 8.5 g/dL — ABNORMAL LOW (ref 13.0–17.0)
MCH: 28.5 pg (ref 26.0–34.0)
MCHC: 32.7 g/dL (ref 30.0–36.0)
MCV: 87.2 fL (ref 78.0–100.0)
PLATELETS: 240 10*3/uL (ref 150–400)
RBC: 2.98 MIL/uL — ABNORMAL LOW (ref 4.22–5.81)
RDW: 13.4 % (ref 11.5–15.5)
WBC: 8 10*3/uL (ref 4.0–10.5)

## 2016-04-04 LAB — VANCOMYCIN, TROUGH: Vancomycin Tr: 14 ug/mL — ABNORMAL LOW (ref 15–20)

## 2016-04-04 MED ORDER — DEXTROSE 5 % IV SOLN: 2.0000 g | Freq: Two times a day (BID) | INTRAVENOUS | 0 refills | Status: AC

## 2016-04-04 MED ORDER — VANCOMYCIN HCL IN DEXTROSE 750-5 MG/150ML-% IV SOLN: 750.0000 mg | Freq: Two times a day (BID) | INTRAVENOUS | 0 refills | Status: DC

## 2016-04-04 MED ORDER — TRAMADOL HCL 50 MG PO TABS: 50.0000 mg | ORAL_TABLET | Freq: Four times a day (QID) | ORAL | 0 refills | Status: DC | PRN

## 2016-04-04 MED ORDER — VANCOMYCIN HCL IN DEXTROSE 750-5 MG/150ML-% IV SOLN: 750.0000 mg | Freq: Two times a day (BID) | INTRAVENOUS | 0 refills | Status: AC

## 2016-04-04 MED ORDER — ACETAMINOPHEN 500 MG PO TABS: 1000.0000 mg | ORAL_TABLET | Freq: Three times a day (TID) | ORAL | 0 refills | Status: DC

## 2016-04-04 MED ORDER — DEXTROSE 5 % IV SOLN: 2.0000 g | Freq: Two times a day (BID) | INTRAVENOUS | 0 refills | Status: DC

## 2016-04-04 MED ORDER — METHOCARBAMOL 500 MG PO TABS: 500.0000 mg | ORAL_TABLET | Freq: Four times a day (QID) | ORAL | 0 refills | Status: AC | PRN

## 2016-04-04 MED ORDER — ASPIRIN 81 MG PO CHEW: 81.0000 mg | CHEWABLE_TABLET | Freq: Two times a day (BID) | ORAL | 0 refills | Status: AC

## 2016-04-04 NOTE — Progress Notes (Signed)
     Subjective: 2 Days Post-Op Procedure(s) (LRB): Resection left hip hemiarthroplasty- Girdlestone (Left)   Patient expresses the pain as mild. Patient did pull out his PICC line last night.  He will need this for the IV antibiotic and they are planning on placing another PICC today.  Objective:   VITALS:   Vitals:   04/03/16 2156 04/04/16 0700  BP: (!) 85/62 (!) 94/42  Pulse: 81 (!) 59  Resp: 16 16  Temp:  97.8 F (36.6 C)    Dorsiflexion/Plantar flexion intact Incision: dressing C/D/I No cellulitis present Compartment soft  LABS  Recent Labs  04/02/16 1605 04/03/16 0338 04/04/16 0650  HGB 10.6* 9.0* 8.5*  HCT 32.4* 27.7* 26.0*  WBC 9.3 9.7 8.0  PLT 276 271 240     Recent Labs  04/02/16 1605 04/03/16 0338 04/04/16 0650  NA 132* 129* 132*  K 4.2 4.8 4.7  BUN '10 18 17  '$ CREATININE 0.61 0.56* 0.43*  GLUCOSE 171* 139* 83     Assessment/Plan: 2 Days Post-Op Procedure(s) (LRB): Resection left hip hemiarthroplasty- Girdlestone (Left) PICC line to be placed Dressing changed to Aquacel by the nurse Up with therapy Discharge to SNF Follow up in 2 weeks at [facility]. Follow up with OLIN,Monte Bronder D in 2 weeks.  Contact information:  [facility] [address], [place] [phone]    Per Infectious Disease: Per pharmacy protocol  vanco and cefepime Aim for Vancomycin trough 15-20 (unless otherwise indicated) Duration:  6 wk End Date: Feb 26th [place] Per Protocol: Labs while on IV antibiotics: Weekly CBC with differential and Vancomycin trough.  BMP twice a week. CRP and ESR weekly in the last 2 wk of abx Please pull PIC at completion of IV antibiotics Fax labs to [phone]     [place] Pugh. Josuel Koeppen   PAC  04/04/2016, 10:12 AM

## 2016-04-04 NOTE — Progress Notes (Signed)
Pt d/c'd back to [facility]. Report given to the nurse, and she was familiar with this patient's history. Informed her of new picc line in URE; this is the only line to go with him. Morning dose of Vanc given and [facility] to transport to facility before 1400 so patient can receive evening doses on time. Patient had lunch here before transport and reports no pain at this time. Dentures in mouth and all belongings, besides personal wheelchair, packed up to go with patient. Pt's wheelchair will be stored until facility or a family member can come and retrieve it.

## 2016-04-04 NOTE — Progress Notes (Signed)
Pt / brother are in agreement with d/c back to [facility] today. PT[facility] transport is required. Medical necessity form completed. D/C Summary sent to SNF for review. Scripts included in d/c packet. # for report provided to nsg. Pt has his wc in hospital room. CSW has requested [facility] to make arrangements to pick up w/c and return it to SNF. [facility] transport is unable to transport w/c.  Werner Lean LCSW [phone]

## 2016-04-04 NOTE — Discharge Instructions (Signed)
Per Infectious Disease: Per pharmacy protocol vanco and cefepime Aim for Vancomycin trough 15-20 (unless otherwise indicated) Duration:    6 wk End Date:  Feb 26th [place] Per Protocol: Labs while on IV antibiotics: Weekly CBC with differential and Vancomycin trough.  BMP twice a week. CRP and ESR weekly in the last 2 wk of abx Please pull PIC at completion of IV antibiotics Fax labs to [phone]

## 2016-04-04 NOTE — Progress Notes (Signed)
Patient was noted to be agitated and loudly calling out. Upon entering the patient's room, his PICC line was noted to be lying on the bed.  Pressure was applied to the gauze wrap that had been placed to 'hide' the line from view.  Zenia Resides, RN with  IVT came in to see the pt.  Site was assessed and vaseline gauze was placed over site, gauze and tape. Will place new order for PICC line replacement.

## 2016-04-04 NOTE — Discharge Summary (Signed)
Physician Discharge Summary  Patient ID: Alexander Hanna MRN: [id number] DOB/AGE: 1940/07/12 76 y.o.  Admit date: 04/02/2016 Discharge date:  04/04/2016  Procedures:  Procedure(s) (LRB): Resection left hip hemiarthroplasty- [place] (Left)  Attending Physician:  Dr. 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 Anterior dislocation of left hip ([place])   . Anxiety   . Depression   . Environmental allergies   . Headache    brain damaged at age 49   . Hypothyroidism   . Incontinent of urine    wears a diaper per brother   . 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 Overactive bladder   . Polio    as a child    HPI:     Pt is a 76 y.o. male complaining of left hip  pain for 2+ years. Patient experienced a hip dislocation and was never reduced.  Paqtient now has significant contracture.  Patient never expresses much pain, but experiencing health issues related to inability to properly rotate the leg.  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  Various options are discussed with the patient. Risks, benefits and expectations were discussed with the patient's family. Patient's family understand the risks, benefits and expectations and wishes to proceed with surgery.   PCP: Maricela Curet, MD   Discharged Condition: fair  Hospital Course:  Patient underwent the above stated procedure on 04/02/2016. Patient tolerated the procedure well and brought to the recovery room in good condition and subsequently to the floor.  POD #1 BP: 89/43 ; Pulse: 61 ; Temp: 97.7 F (36.5 C) ; Resp: 16 Patient not complaining of pain, and no major events reported over night. Neurovascular intact and incision: dressing C/D/I.  LABS    Basename    HGB     9.0  HCT     27.7   POD #2  BP: 94/42 ; Pulse: 59 ; Temp: 97.8 F (36.6 C) ; Resp: 16 Patient expresses the pain as mild. Patient did pull out his PICC line last night.  He will need this for the IV antibiotic and they are planning on placing another PICC today. Dorsiflexion/plantar flexion intact, incision: dressing C/D/I, no cellulitis present and compartment soft.   LABS  Basename    HGB     8.5  HCT     26.0    Discharge Exam: General appearance: alert and no distress Extremities: Homans sign is negative, no sign of DVT, no edema, redness or tenderness in the calves or thighs and no ulcers, gangrene or trophic changes  Disposition:   Skilled nursing facility with follow up in 2 weeks   Per Infectious Disease:  Per pharmacy protocol vanco and cefepime  Aim for Vancomycin trough 15-20 (unless otherwise indicated)  Duration:  6 wk  End Date: Feb 26th  [place] Per Protocol:  Labs while on IV antibiotics:  Weekly CBC with differential and Vancomycin trough.  BMP twice a week. CRP and ESR weekly in the last 2 wk of abx  Please pull PIC at completion of IV antibiotics  Fax labs to [phone]      Follow-up Information    Mauri Pole, MD. Schedule an appointment as soon  as possible for a visit in 2 week(s).   Specialty:  Orthopedic Surgery Contact information: [address] [place] [address] [phone]             Allergies as of 04/04/2016      Reactions   Amoxicillin Other (See Comments)   Reaction unknown-provided via [place]. Pt's brother states he is not allergic to amoxicillin   Avelox [moxifloxacin Hcl In Nacl]    Hydrocodone Other (See Comments)   Reaction unknown-provided via Nursing Facility   Moxifloxacin Other (See Comments)   Reaction unknown-provided via Nursing Facility      Medication List    STOP taking these medications   aspirin EC 81 MG tablet Replaced by:   aspirin 81 MG chewable tablet     TAKE these medications   acetaminophen 500 MG tablet Commonly known as:  TYLENOL Take 2 tablets (1,000 mg total) by mouth every 8 (eight) hours. What changed:  medication strength  how much to take  when to take this   acidophilus Caps capsule Take 1 capsule by mouth 2 (two) times daily.   ascorbic acid 500 MG tablet Commonly known as:  VITAMIN C Take 500 mg by mouth daily.   aspirin 81 MG chewable tablet Chew 1 tablet (81 mg total) by mouth 2 (two) times daily. Take for 4 weeks, then resume regular dose. Replaces:  aspirin EC 81 MG tablet   atorvastatin 10 MG tablet Commonly known as:  LIPITOR Take 10 mg by mouth daily at 6 PM.   ceFEPIme 2 g in dextrose 5 % 50 mL Inject 2 g into the vein every 12 (twelve) hours.   cetirizine 10 MG tablet Commonly known as:  ZYRTEC Take 10 mg by mouth daily.   cholecalciferol 1000 units tablet Commonly known as:  VITAMIN D Take 1,000 Units by mouth daily.   docusate sodium 100 MG capsule Commonly known as:  COLACE Take 100 mg by mouth 2 (two) times daily.   ezetimibe 10 MG tablet Commonly known as:  ZETIA Take 10 mg by mouth daily.   feeding supplement (PRO-STAT SUGAR FREE 64) Liqd Take 30 mLs by mouth 2 (two) times daily.   ferrous sulfate 325 (65 FE) MG tablet Take 325 mg by mouth 2 (two) times daily.   finasteride 5 MG tablet Commonly known as:  PROSCAR Take 5 mg by mouth daily.   fluticasone 50 MCG/ACT nasal spray Commonly known as:  FLONASE Place 2 sprays into the nose 2 (two) times daily. Allergies   guaiFENesin 600 MG 12 hr tablet Commonly known as:  MUCINEX Take 600 mg by mouth 2 (two) times daily.   hydroxypropyl methylcellulose / hypromellose 2.5 % ophthalmic solution Commonly known as:  ISOPTO TEARS / GONIOVISC Place 1 drop into both eyes 4 (four) times daily as needed for dry eyes.   levothyroxine 150 MCG tablet Commonly known as:  SYNTHROID, LEVOTHROID Take 150  mcg by mouth at bedtime.   methocarbamol 500 MG tablet Commonly known as:  ROBAXIN Take 1 tablet (500 mg total) by mouth every 6 (six) hours as needed for muscle spasms.   multivitamin tablet Take 1 tablet by mouth daily.   niacin 500 MG CR tablet Commonly known as:  NIASPAN Take 500 mg by mouth at bedtime.   omeprazole 20 MG capsule Commonly known as:  PRILOSEC Take 20 mg by mouth daily.   polyethylene glycol packet Commonly known as:  MIRALAX / GLYCOLAX Take 17 g by  mouth daily.   RESOURCE 2.0 Liqd Take 120 mLs by mouth 2 (two) times daily.   tamsulosin 0.4 MG Caps capsule Commonly known as:  FLOMAX Take 0.4 mg by mouth daily.   traMADol 50 MG tablet Commonly known as:  ULTRAM Take 1-2 tablets (50-100 mg total) by mouth every 6 (six) hours as needed. What changed:  how much to take  reasons to take this   traZODone 50 MG tablet Commonly known as:  DESYREL Take 12.5 mg by mouth at bedtime.   Vancomycin 750-5 MG/150ML-% Soln Commonly known as:  VANCOCIN Inject 150 mLs (750 mg total) into the vein every 12 (twelve) hours.        Signed: [place] Pugh. Wiletta Bermingham   PA-C  04/04/2016, 10:48 AM

## 2016-04-04 NOTE — Progress Notes (Addendum)
Pharmacy Antibiotic Note  Alexander Hanna is a 76 y.o. male admitted on 04/02/2016 with L hip prosthetic joint infection.  Pharmacy has been consulted for vancomycin and cefepime dosing.  Patient s/p resection and antibiotic spacer placement on 1/15; received 1000 mg preop x 1. History of MRSA and pseudomonal infections per ID notes  Today, 04/04/2016:  Day 3 vancomycin and cefepime  Afebrile, WBC wnl, SCr low  Vancomycin trough slightly subtherapeutic, but drawn ~1 hr late  To continue vanc/cefepime for 6 weeks per ID recommendations  Plan:  Continue Cefepime 2g IV q12 hr  Would continue Vancomycin 750 mg IV q12 hr with goal trough 15-20 mcg/mL; I expect true trough is slightly higher, patient is not showing signs of acute infection, and will likely continue to accumulate somewhat over 6 weeks of therapy  Recommend rechecking trough in 7 days; can check less frequently if levels stable   Height: 5\' 6"  (167.6 cm) Weight: 150 lb (68 kg) IBW/kg (Calculated) : 63.8  Temp (24hrs), Avg:97.8 F (36.6 C), Min:97.8 F (36.6 C), Max:97.8 F (36.6 C)   Recent Labs Lab 04/02/16 1605 04/03/16 0338 04/04/16 0650 04/04/16 1019  WBC 9.3 9.7 8.0  --   CREATININE 0.61 0.56* 0.43*  --   VANCOTROUGH  --   --   --  14*    Estimated Creatinine Clearance: 69.8 mL/min (by C-G formula based on SCr of 0.43 mg/dL (L)).    Allergies  Allergen Reactions  . Amoxicillin Other (See Comments)    Reaction unknown-provided via [facility]. Pt's brother states he is not allergic to amoxicillin  . Avelox [Moxifloxacin Hcl In Nacl]   . Hydrocodone Other (See Comments)    Reaction unknown-provided via [facility]  . Moxifloxacin Other (See Comments)    Reaction unknown-provided via Nursing Facility    Antimicrobials this admission: Vanc 1/15 >>  Cefepime 1/15 >> Ancef x 1 1/15   Dose adjustments this admission: 1/17 0930 VT =  14 mcg/ml on 750mg  q12h (prior to 5th dose overall)    Microbiology results: None - no significant purulent fluid found in joint during excision of THA w/ abx spacer placement Per ID note - h/o MRSA and pseudomonas infection 1/9 MRSA PCR: neg  Thank you for allowing pharmacy to be a part of this patient's care.  Reuel Boom, PharmD, BCPS Pager: [phone] 04/04/2016, 11:28 AM

## 2016-04-05 DIAGNOSIS — Z4732 Aftercare following explantation of hip joint prosthesis: Secondary | ICD-10-CM | POA: Diagnosis not present

## 2016-04-05 DIAGNOSIS — T8459XA Infection and inflammatory reaction due to other internal joint prosthesis, initial encounter: Secondary | ICD-10-CM | POA: Diagnosis not present

## 2016-04-05 DIAGNOSIS — E559 Vitamin D deficiency, unspecified: Secondary | ICD-10-CM | POA: Diagnosis not present

## 2016-04-05 DIAGNOSIS — M79606 Pain in leg, unspecified: Secondary | ICD-10-CM | POA: Diagnosis not present

## 2016-04-06 DIAGNOSIS — M6281 Muscle weakness (generalized): Secondary | ICD-10-CM | POA: Diagnosis not present

## 2016-04-06 DIAGNOSIS — Z96642 Presence of left artificial hip joint: Secondary | ICD-10-CM | POA: Diagnosis not present

## 2016-04-06 DIAGNOSIS — Z96649 Presence of unspecified artificial hip joint: Secondary | ICD-10-CM

## 2016-04-06 DIAGNOSIS — T8459XA Infection and inflammatory reaction due to other internal joint prosthesis, initial encounter: Secondary | ICD-10-CM

## 2016-04-06 DIAGNOSIS — Z9889 Other specified postprocedural states: Secondary | ICD-10-CM | POA: Diagnosis not present

## 2016-04-09 DIAGNOSIS — I10 Essential (primary) hypertension: Secondary | ICD-10-CM | POA: Diagnosis not present

## 2016-04-09 DIAGNOSIS — A4902 Methicillin resistant Staphylococcus aureus infection, unspecified site: Secondary | ICD-10-CM | POA: Diagnosis not present

## 2016-04-09 DIAGNOSIS — Z9889 Other specified postprocedural states: Secondary | ICD-10-CM | POA: Diagnosis not present

## 2016-04-09 DIAGNOSIS — D649 Anemia, unspecified: Secondary | ICD-10-CM | POA: Diagnosis not present

## 2016-04-09 DIAGNOSIS — M6281 Muscle weakness (generalized): Secondary | ICD-10-CM | POA: Diagnosis not present

## 2016-04-09 DIAGNOSIS — Z96642 Presence of left artificial hip joint: Secondary | ICD-10-CM | POA: Diagnosis not present

## 2016-04-10 DIAGNOSIS — Z96642 Presence of left artificial hip joint: Secondary | ICD-10-CM | POA: Diagnosis not present

## 2016-04-10 DIAGNOSIS — Z9889 Other specified postprocedural states: Secondary | ICD-10-CM | POA: Diagnosis not present

## 2016-04-10 DIAGNOSIS — M6281 Muscle weakness (generalized): Secondary | ICD-10-CM | POA: Diagnosis not present

## 2016-04-11 DIAGNOSIS — Z96642 Presence of left artificial hip joint: Secondary | ICD-10-CM | POA: Diagnosis not present

## 2016-04-11 DIAGNOSIS — M6281 Muscle weakness (generalized): Secondary | ICD-10-CM | POA: Diagnosis not present

## 2016-04-11 DIAGNOSIS — Z9889 Other specified postprocedural states: Secondary | ICD-10-CM | POA: Diagnosis not present

## 2016-04-12 DIAGNOSIS — Z9889 Other specified postprocedural states: Secondary | ICD-10-CM | POA: Diagnosis not present

## 2016-04-12 DIAGNOSIS — M6281 Muscle weakness (generalized): Secondary | ICD-10-CM | POA: Diagnosis not present

## 2016-04-12 DIAGNOSIS — Z96642 Presence of left artificial hip joint: Secondary | ICD-10-CM | POA: Diagnosis not present

## 2016-04-13 DIAGNOSIS — I10 Essential (primary) hypertension: Secondary | ICD-10-CM | POA: Diagnosis not present

## 2016-04-13 DIAGNOSIS — M6281 Muscle weakness (generalized): Secondary | ICD-10-CM | POA: Diagnosis not present

## 2016-04-13 DIAGNOSIS — Z9889 Other specified postprocedural states: Secondary | ICD-10-CM | POA: Diagnosis not present

## 2016-04-13 DIAGNOSIS — Z96642 Presence of left artificial hip joint: Secondary | ICD-10-CM | POA: Diagnosis not present

## 2016-04-16 DIAGNOSIS — M6281 Muscle weakness (generalized): Secondary | ICD-10-CM | POA: Diagnosis not present

## 2016-04-16 DIAGNOSIS — Z9889 Other specified postprocedural states: Secondary | ICD-10-CM | POA: Diagnosis not present

## 2016-04-16 DIAGNOSIS — A4902 Methicillin resistant Staphylococcus aureus infection, unspecified site: Secondary | ICD-10-CM | POA: Diagnosis not present

## 2016-04-16 DIAGNOSIS — Z471 Aftercare following joint replacement surgery: Secondary | ICD-10-CM | POA: Diagnosis not present

## 2016-04-16 DIAGNOSIS — Z96642 Presence of left artificial hip joint: Secondary | ICD-10-CM | POA: Diagnosis not present

## 2016-04-16 DIAGNOSIS — T8459XD Infection and inflammatory reaction due to other internal joint prosthesis, subsequent encounter: Secondary | ICD-10-CM | POA: Diagnosis not present

## 2016-04-16 DIAGNOSIS — A498 Other bacterial infections of unspecified site: Secondary | ICD-10-CM | POA: Diagnosis not present

## 2016-04-17 DIAGNOSIS — M6281 Muscle weakness (generalized): Secondary | ICD-10-CM | POA: Diagnosis not present

## 2016-04-17 DIAGNOSIS — Z9889 Other specified postprocedural states: Secondary | ICD-10-CM | POA: Diagnosis not present

## 2016-04-17 DIAGNOSIS — Z96642 Presence of left artificial hip joint: Secondary | ICD-10-CM | POA: Diagnosis not present

## 2016-04-18 DIAGNOSIS — Z96642 Presence of left artificial hip joint: Secondary | ICD-10-CM | POA: Diagnosis not present

## 2016-04-18 DIAGNOSIS — Z9889 Other specified postprocedural states: Secondary | ICD-10-CM | POA: Diagnosis not present

## 2016-04-18 DIAGNOSIS — M6281 Muscle weakness (generalized): Secondary | ICD-10-CM | POA: Diagnosis not present

## 2016-04-19 DIAGNOSIS — E119 Type 2 diabetes mellitus without complications: Secondary | ICD-10-CM | POA: Diagnosis not present

## 2016-04-19 DIAGNOSIS — D649 Anemia, unspecified: Secondary | ICD-10-CM | POA: Diagnosis not present

## 2016-04-19 DIAGNOSIS — E559 Vitamin D deficiency, unspecified: Secondary | ICD-10-CM | POA: Diagnosis not present

## 2016-04-19 DIAGNOSIS — Z96642 Presence of left artificial hip joint: Secondary | ICD-10-CM | POA: Diagnosis not present

## 2016-04-19 DIAGNOSIS — I10 Essential (primary) hypertension: Secondary | ICD-10-CM | POA: Diagnosis not present

## 2016-04-19 DIAGNOSIS — Z9889 Other specified postprocedural states: Secondary | ICD-10-CM | POA: Diagnosis not present

## 2016-04-19 DIAGNOSIS — M6281 Muscle weakness (generalized): Secondary | ICD-10-CM | POA: Diagnosis not present

## 2016-04-20 DIAGNOSIS — Z9889 Other specified postprocedural states: Secondary | ICD-10-CM | POA: Diagnosis not present

## 2016-04-20 DIAGNOSIS — Z471 Aftercare following joint replacement surgery: Secondary | ICD-10-CM | POA: Diagnosis not present

## 2016-04-20 DIAGNOSIS — M6281 Muscle weakness (generalized): Secondary | ICD-10-CM | POA: Diagnosis not present

## 2016-04-20 DIAGNOSIS — T8459XD Infection and inflammatory reaction due to other internal joint prosthesis, subsequent encounter: Secondary | ICD-10-CM | POA: Diagnosis not present

## 2016-04-20 DIAGNOSIS — Z96642 Presence of left artificial hip joint: Secondary | ICD-10-CM | POA: Diagnosis not present

## 2016-04-23 DIAGNOSIS — A498 Other bacterial infections of unspecified site: Secondary | ICD-10-CM | POA: Diagnosis not present

## 2016-04-23 DIAGNOSIS — D649 Anemia, unspecified: Secondary | ICD-10-CM | POA: Diagnosis not present

## 2016-04-23 DIAGNOSIS — I10 Essential (primary) hypertension: Secondary | ICD-10-CM | POA: Diagnosis not present

## 2016-04-23 DIAGNOSIS — Z96642 Presence of left artificial hip joint: Secondary | ICD-10-CM | POA: Diagnosis not present

## 2016-04-23 DIAGNOSIS — M6281 Muscle weakness (generalized): Secondary | ICD-10-CM | POA: Diagnosis not present

## 2016-04-23 DIAGNOSIS — Z9889 Other specified postprocedural states: Secondary | ICD-10-CM | POA: Diagnosis not present

## 2016-04-24 DIAGNOSIS — M6281 Muscle weakness (generalized): Secondary | ICD-10-CM | POA: Diagnosis not present

## 2016-04-24 DIAGNOSIS — Z9889 Other specified postprocedural states: Secondary | ICD-10-CM | POA: Diagnosis not present

## 2016-04-24 DIAGNOSIS — Z96642 Presence of left artificial hip joint: Secondary | ICD-10-CM | POA: Diagnosis not present

## 2016-04-25 DIAGNOSIS — Z96642 Presence of left artificial hip joint: Secondary | ICD-10-CM | POA: Diagnosis not present

## 2016-04-25 DIAGNOSIS — M6281 Muscle weakness (generalized): Secondary | ICD-10-CM | POA: Diagnosis not present

## 2016-04-25 DIAGNOSIS — Z9889 Other specified postprocedural states: Secondary | ICD-10-CM | POA: Diagnosis not present

## 2016-04-26 DIAGNOSIS — M6281 Muscle weakness (generalized): Secondary | ICD-10-CM | POA: Diagnosis not present

## 2016-04-26 DIAGNOSIS — Z96642 Presence of left artificial hip joint: Secondary | ICD-10-CM | POA: Diagnosis not present

## 2016-04-26 DIAGNOSIS — Z9889 Other specified postprocedural states: Secondary | ICD-10-CM | POA: Diagnosis not present

## 2016-04-26 DIAGNOSIS — A498 Other bacterial infections of unspecified site: Secondary | ICD-10-CM | POA: Diagnosis not present

## 2016-04-27 DIAGNOSIS — Z9889 Other specified postprocedural states: Secondary | ICD-10-CM | POA: Diagnosis not present

## 2016-04-27 DIAGNOSIS — Z96642 Presence of left artificial hip joint: Secondary | ICD-10-CM | POA: Diagnosis not present

## 2016-04-27 DIAGNOSIS — M6281 Muscle weakness (generalized): Secondary | ICD-10-CM | POA: Diagnosis not present

## 2016-04-30 DIAGNOSIS — M6281 Muscle weakness (generalized): Secondary | ICD-10-CM | POA: Diagnosis not present

## 2016-04-30 DIAGNOSIS — Z9889 Other specified postprocedural states: Secondary | ICD-10-CM | POA: Diagnosis not present

## 2016-04-30 DIAGNOSIS — Z96642 Presence of left artificial hip joint: Secondary | ICD-10-CM | POA: Diagnosis not present

## 2016-05-01 DIAGNOSIS — M6281 Muscle weakness (generalized): Secondary | ICD-10-CM | POA: Diagnosis not present

## 2016-05-01 DIAGNOSIS — Z96642 Presence of left artificial hip joint: Secondary | ICD-10-CM | POA: Diagnosis not present

## 2016-05-01 DIAGNOSIS — Z9889 Other specified postprocedural states: Secondary | ICD-10-CM | POA: Diagnosis not present

## 2016-05-02 DIAGNOSIS — M6281 Muscle weakness (generalized): Secondary | ICD-10-CM | POA: Diagnosis not present

## 2016-05-02 DIAGNOSIS — Z96642 Presence of left artificial hip joint: Secondary | ICD-10-CM | POA: Diagnosis not present

## 2016-05-02 DIAGNOSIS — Z9889 Other specified postprocedural states: Secondary | ICD-10-CM | POA: Diagnosis not present

## 2016-05-03 DIAGNOSIS — M6281 Muscle weakness (generalized): Secondary | ICD-10-CM | POA: Diagnosis not present

## 2016-05-03 DIAGNOSIS — Z96642 Presence of left artificial hip joint: Secondary | ICD-10-CM | POA: Diagnosis not present

## 2016-05-03 DIAGNOSIS — Z9889 Other specified postprocedural states: Secondary | ICD-10-CM | POA: Diagnosis not present

## 2016-05-03 DIAGNOSIS — E039 Hypothyroidism, unspecified: Secondary | ICD-10-CM | POA: Diagnosis not present

## 2016-05-03 DIAGNOSIS — A4902 Methicillin resistant Staphylococcus aureus infection, unspecified site: Secondary | ICD-10-CM | POA: Diagnosis not present

## 2016-05-03 DIAGNOSIS — I10 Essential (primary) hypertension: Secondary | ICD-10-CM | POA: Diagnosis not present

## 2016-05-03 DIAGNOSIS — D649 Anemia, unspecified: Secondary | ICD-10-CM | POA: Diagnosis not present

## 2016-05-03 DIAGNOSIS — A498 Other bacterial infections of unspecified site: Secondary | ICD-10-CM | POA: Diagnosis not present

## 2016-05-04 DIAGNOSIS — Z9889 Other specified postprocedural states: Secondary | ICD-10-CM | POA: Diagnosis not present

## 2016-05-04 DIAGNOSIS — M6281 Muscle weakness (generalized): Secondary | ICD-10-CM | POA: Diagnosis not present

## 2016-05-04 DIAGNOSIS — Z96642 Presence of left artificial hip joint: Secondary | ICD-10-CM | POA: Diagnosis not present

## 2016-05-07 ENCOUNTER — Inpatient Hospital Stay: Payer: Medicare Other | Admitting: Infectious Diseases

## 2016-05-07 DIAGNOSIS — A498 Other bacterial infections of unspecified site: Secondary | ICD-10-CM | POA: Diagnosis not present

## 2016-05-07 DIAGNOSIS — Z96642 Presence of left artificial hip joint: Secondary | ICD-10-CM | POA: Diagnosis not present

## 2016-05-07 DIAGNOSIS — Z9889 Other specified postprocedural states: Secondary | ICD-10-CM | POA: Diagnosis not present

## 2016-05-07 DIAGNOSIS — M6281 Muscle weakness (generalized): Secondary | ICD-10-CM | POA: Diagnosis not present

## 2016-05-07 DIAGNOSIS — D649 Anemia, unspecified: Secondary | ICD-10-CM | POA: Diagnosis not present

## 2016-05-08 DIAGNOSIS — Z96642 Presence of left artificial hip joint: Secondary | ICD-10-CM | POA: Diagnosis not present

## 2016-05-08 DIAGNOSIS — Z9889 Other specified postprocedural states: Secondary | ICD-10-CM | POA: Diagnosis not present

## 2016-05-08 DIAGNOSIS — B351 Tinea unguium: Secondary | ICD-10-CM | POA: Diagnosis not present

## 2016-05-08 DIAGNOSIS — M6281 Muscle weakness (generalized): Secondary | ICD-10-CM | POA: Diagnosis not present

## 2016-05-08 DIAGNOSIS — M79674 Pain in right toe(s): Secondary | ICD-10-CM | POA: Diagnosis not present

## 2016-05-09 DIAGNOSIS — M6281 Muscle weakness (generalized): Secondary | ICD-10-CM | POA: Diagnosis not present

## 2016-05-09 DIAGNOSIS — Z9889 Other specified postprocedural states: Secondary | ICD-10-CM | POA: Diagnosis not present

## 2016-05-09 DIAGNOSIS — Z96642 Presence of left artificial hip joint: Secondary | ICD-10-CM | POA: Diagnosis not present

## 2016-05-10 DIAGNOSIS — A498 Other bacterial infections of unspecified site: Secondary | ICD-10-CM | POA: Diagnosis not present

## 2016-05-10 DIAGNOSIS — D649 Anemia, unspecified: Secondary | ICD-10-CM | POA: Diagnosis not present

## 2016-05-10 DIAGNOSIS — Z9889 Other specified postprocedural states: Secondary | ICD-10-CM | POA: Diagnosis not present

## 2016-05-10 DIAGNOSIS — I2510 Atherosclerotic heart disease of native coronary artery without angina pectoris: Secondary | ICD-10-CM | POA: Diagnosis not present

## 2016-05-10 DIAGNOSIS — M6281 Muscle weakness (generalized): Secondary | ICD-10-CM | POA: Diagnosis not present

## 2016-05-10 DIAGNOSIS — Z96642 Presence of left artificial hip joint: Secondary | ICD-10-CM | POA: Diagnosis not present

## 2016-05-10 DIAGNOSIS — E785 Hyperlipidemia, unspecified: Secondary | ICD-10-CM | POA: Diagnosis not present

## 2016-05-11 DIAGNOSIS — Z96642 Presence of left artificial hip joint: Secondary | ICD-10-CM | POA: Diagnosis not present

## 2016-05-11 DIAGNOSIS — M6281 Muscle weakness (generalized): Secondary | ICD-10-CM | POA: Diagnosis not present

## 2016-05-11 DIAGNOSIS — Z9889 Other specified postprocedural states: Secondary | ICD-10-CM | POA: Diagnosis not present

## 2016-05-14 DIAGNOSIS — Z96642 Presence of left artificial hip joint: Secondary | ICD-10-CM | POA: Diagnosis not present

## 2016-05-14 DIAGNOSIS — M6281 Muscle weakness (generalized): Secondary | ICD-10-CM | POA: Diagnosis not present

## 2016-05-14 DIAGNOSIS — A498 Other bacterial infections of unspecified site: Secondary | ICD-10-CM | POA: Diagnosis not present

## 2016-05-14 DIAGNOSIS — Z9889 Other specified postprocedural states: Secondary | ICD-10-CM | POA: Diagnosis not present

## 2016-05-15 DIAGNOSIS — Z96642 Presence of left artificial hip joint: Secondary | ICD-10-CM | POA: Diagnosis not present

## 2016-05-15 DIAGNOSIS — Z9889 Other specified postprocedural states: Secondary | ICD-10-CM | POA: Diagnosis not present

## 2016-05-15 DIAGNOSIS — M6281 Muscle weakness (generalized): Secondary | ICD-10-CM | POA: Diagnosis not present

## 2016-05-16 DIAGNOSIS — Z9889 Other specified postprocedural states: Secondary | ICD-10-CM | POA: Diagnosis not present

## 2016-05-16 DIAGNOSIS — Z96642 Presence of left artificial hip joint: Secondary | ICD-10-CM | POA: Diagnosis not present

## 2016-05-16 DIAGNOSIS — M6281 Muscle weakness (generalized): Secondary | ICD-10-CM | POA: Diagnosis not present

## 2016-05-17 DIAGNOSIS — S73006D Unspecified dislocation of unspecified hip, subsequent encounter: Secondary | ICD-10-CM | POA: Diagnosis not present

## 2016-05-17 DIAGNOSIS — R1310 Dysphagia, unspecified: Secondary | ICD-10-CM | POA: Diagnosis not present

## 2016-05-17 DIAGNOSIS — T8459XA Infection and inflammatory reaction due to other internal joint prosthesis, initial encounter: Secondary | ICD-10-CM | POA: Diagnosis not present

## 2016-05-17 DIAGNOSIS — M6281 Muscle weakness (generalized): Secondary | ICD-10-CM | POA: Diagnosis not present

## 2016-05-17 DIAGNOSIS — M79606 Pain in leg, unspecified: Secondary | ICD-10-CM | POA: Diagnosis not present

## 2016-05-17 DIAGNOSIS — G9340 Encephalopathy, unspecified: Secondary | ICD-10-CM | POA: Diagnosis not present

## 2016-05-17 DIAGNOSIS — Z4732 Aftercare following explantation of hip joint prosthesis: Secondary | ICD-10-CM | POA: Diagnosis not present

## 2016-05-17 DIAGNOSIS — Z96642 Presence of left artificial hip joint: Secondary | ICD-10-CM | POA: Diagnosis not present

## 2016-05-17 DIAGNOSIS — Z9889 Other specified postprocedural states: Secondary | ICD-10-CM | POA: Diagnosis not present

## 2016-05-18 DIAGNOSIS — Z96642 Presence of left artificial hip joint: Secondary | ICD-10-CM | POA: Diagnosis not present

## 2016-05-18 DIAGNOSIS — M6281 Muscle weakness (generalized): Secondary | ICD-10-CM | POA: Diagnosis not present

## 2016-05-18 DIAGNOSIS — G9340 Encephalopathy, unspecified: Secondary | ICD-10-CM | POA: Diagnosis not present

## 2016-05-18 DIAGNOSIS — R1310 Dysphagia, unspecified: Secondary | ICD-10-CM | POA: Diagnosis not present

## 2016-05-18 DIAGNOSIS — Z9889 Other specified postprocedural states: Secondary | ICD-10-CM | POA: Diagnosis not present

## 2016-05-21 DIAGNOSIS — R1310 Dysphagia, unspecified: Secondary | ICD-10-CM | POA: Diagnosis not present

## 2016-05-21 DIAGNOSIS — Z96642 Presence of left artificial hip joint: Secondary | ICD-10-CM | POA: Diagnosis not present

## 2016-05-21 DIAGNOSIS — Z9889 Other specified postprocedural states: Secondary | ICD-10-CM | POA: Diagnosis not present

## 2016-05-21 DIAGNOSIS — G9340 Encephalopathy, unspecified: Secondary | ICD-10-CM | POA: Diagnosis not present

## 2016-05-21 DIAGNOSIS — M6281 Muscle weakness (generalized): Secondary | ICD-10-CM | POA: Diagnosis not present

## 2016-05-22 DIAGNOSIS — Z9889 Other specified postprocedural states: Secondary | ICD-10-CM | POA: Diagnosis not present

## 2016-05-22 DIAGNOSIS — Z96642 Presence of left artificial hip joint: Secondary | ICD-10-CM | POA: Diagnosis not present

## 2016-05-22 DIAGNOSIS — R1310 Dysphagia, unspecified: Secondary | ICD-10-CM | POA: Diagnosis not present

## 2016-05-22 DIAGNOSIS — M6281 Muscle weakness (generalized): Secondary | ICD-10-CM | POA: Diagnosis not present

## 2016-05-22 DIAGNOSIS — G9340 Encephalopathy, unspecified: Secondary | ICD-10-CM | POA: Diagnosis not present

## 2016-05-23 DIAGNOSIS — G9340 Encephalopathy, unspecified: Secondary | ICD-10-CM | POA: Diagnosis not present

## 2016-05-23 DIAGNOSIS — R1310 Dysphagia, unspecified: Secondary | ICD-10-CM | POA: Diagnosis not present

## 2016-05-23 DIAGNOSIS — Z96642 Presence of left artificial hip joint: Secondary | ICD-10-CM | POA: Diagnosis not present

## 2016-05-23 DIAGNOSIS — M6281 Muscle weakness (generalized): Secondary | ICD-10-CM | POA: Diagnosis not present

## 2016-05-23 DIAGNOSIS — Z9889 Other specified postprocedural states: Secondary | ICD-10-CM | POA: Diagnosis not present

## 2016-05-24 DIAGNOSIS — M6281 Muscle weakness (generalized): Secondary | ICD-10-CM | POA: Diagnosis not present

## 2016-05-24 DIAGNOSIS — Z9889 Other specified postprocedural states: Secondary | ICD-10-CM | POA: Diagnosis not present

## 2016-05-24 DIAGNOSIS — Z471 Aftercare following joint replacement surgery: Secondary | ICD-10-CM | POA: Diagnosis not present

## 2016-05-24 DIAGNOSIS — G9340 Encephalopathy, unspecified: Secondary | ICD-10-CM | POA: Diagnosis not present

## 2016-05-24 DIAGNOSIS — Z96642 Presence of left artificial hip joint: Secondary | ICD-10-CM | POA: Diagnosis not present

## 2016-05-24 DIAGNOSIS — T8459XD Infection and inflammatory reaction due to other internal joint prosthesis, subsequent encounter: Secondary | ICD-10-CM | POA: Diagnosis not present

## 2016-05-24 DIAGNOSIS — R1310 Dysphagia, unspecified: Secondary | ICD-10-CM | POA: Diagnosis not present

## 2016-05-25 DIAGNOSIS — M6281 Muscle weakness (generalized): Secondary | ICD-10-CM | POA: Diagnosis not present

## 2016-05-25 DIAGNOSIS — Z9889 Other specified postprocedural states: Secondary | ICD-10-CM | POA: Diagnosis not present

## 2016-05-25 DIAGNOSIS — Z96642 Presence of left artificial hip joint: Secondary | ICD-10-CM | POA: Diagnosis not present

## 2016-05-25 DIAGNOSIS — G9340 Encephalopathy, unspecified: Secondary | ICD-10-CM | POA: Diagnosis not present

## 2016-05-25 DIAGNOSIS — R1310 Dysphagia, unspecified: Secondary | ICD-10-CM | POA: Diagnosis not present

## 2016-05-28 DIAGNOSIS — M6281 Muscle weakness (generalized): Secondary | ICD-10-CM | POA: Diagnosis not present

## 2016-05-28 DIAGNOSIS — Z96642 Presence of left artificial hip joint: Secondary | ICD-10-CM | POA: Diagnosis not present

## 2016-05-28 DIAGNOSIS — Z9889 Other specified postprocedural states: Secondary | ICD-10-CM | POA: Diagnosis not present

## 2016-05-28 DIAGNOSIS — R1310 Dysphagia, unspecified: Secondary | ICD-10-CM | POA: Diagnosis not present

## 2016-05-28 DIAGNOSIS — G9340 Encephalopathy, unspecified: Secondary | ICD-10-CM | POA: Diagnosis not present

## 2016-05-29 DIAGNOSIS — Z96642 Presence of left artificial hip joint: Secondary | ICD-10-CM | POA: Diagnosis not present

## 2016-05-29 DIAGNOSIS — R1310 Dysphagia, unspecified: Secondary | ICD-10-CM | POA: Diagnosis not present

## 2016-05-29 DIAGNOSIS — G9340 Encephalopathy, unspecified: Secondary | ICD-10-CM | POA: Diagnosis not present

## 2016-05-29 DIAGNOSIS — M6281 Muscle weakness (generalized): Secondary | ICD-10-CM | POA: Diagnosis not present

## 2016-05-29 DIAGNOSIS — Z9889 Other specified postprocedural states: Secondary | ICD-10-CM | POA: Diagnosis not present

## 2016-05-30 DIAGNOSIS — R1310 Dysphagia, unspecified: Secondary | ICD-10-CM | POA: Diagnosis not present

## 2016-05-30 DIAGNOSIS — G9340 Encephalopathy, unspecified: Secondary | ICD-10-CM | POA: Diagnosis not present

## 2016-05-30 DIAGNOSIS — Z9889 Other specified postprocedural states: Secondary | ICD-10-CM | POA: Diagnosis not present

## 2016-05-30 DIAGNOSIS — Z96642 Presence of left artificial hip joint: Secondary | ICD-10-CM | POA: Diagnosis not present

## 2016-05-30 DIAGNOSIS — M6281 Muscle weakness (generalized): Secondary | ICD-10-CM | POA: Diagnosis not present

## 2016-05-31 DIAGNOSIS — G9340 Encephalopathy, unspecified: Secondary | ICD-10-CM | POA: Diagnosis not present

## 2016-05-31 DIAGNOSIS — R1310 Dysphagia, unspecified: Secondary | ICD-10-CM | POA: Diagnosis not present

## 2016-05-31 DIAGNOSIS — Z96642 Presence of left artificial hip joint: Secondary | ICD-10-CM | POA: Diagnosis not present

## 2016-05-31 DIAGNOSIS — M6281 Muscle weakness (generalized): Secondary | ICD-10-CM | POA: Diagnosis not present

## 2016-05-31 DIAGNOSIS — Z9889 Other specified postprocedural states: Secondary | ICD-10-CM | POA: Diagnosis not present

## 2016-06-01 DIAGNOSIS — M6281 Muscle weakness (generalized): Secondary | ICD-10-CM | POA: Diagnosis not present

## 2016-06-01 DIAGNOSIS — G9340 Encephalopathy, unspecified: Secondary | ICD-10-CM | POA: Diagnosis not present

## 2016-06-01 DIAGNOSIS — Z9889 Other specified postprocedural states: Secondary | ICD-10-CM | POA: Diagnosis not present

## 2016-06-01 DIAGNOSIS — R1310 Dysphagia, unspecified: Secondary | ICD-10-CM | POA: Diagnosis not present

## 2016-06-01 DIAGNOSIS — Z96642 Presence of left artificial hip joint: Secondary | ICD-10-CM | POA: Diagnosis not present

## 2016-06-04 DIAGNOSIS — Z96642 Presence of left artificial hip joint: Secondary | ICD-10-CM | POA: Diagnosis not present

## 2016-06-04 DIAGNOSIS — L89159 Pressure ulcer of sacral region, unspecified stage: Secondary | ICD-10-CM | POA: Diagnosis not present

## 2016-06-04 DIAGNOSIS — R1310 Dysphagia, unspecified: Secondary | ICD-10-CM | POA: Diagnosis not present

## 2016-06-04 DIAGNOSIS — B372 Candidiasis of skin and nail: Secondary | ICD-10-CM | POA: Diagnosis not present

## 2016-06-04 DIAGNOSIS — G9340 Encephalopathy, unspecified: Secondary | ICD-10-CM | POA: Diagnosis not present

## 2016-06-04 DIAGNOSIS — M6281 Muscle weakness (generalized): Secondary | ICD-10-CM | POA: Diagnosis not present

## 2016-06-04 DIAGNOSIS — Z9889 Other specified postprocedural states: Secondary | ICD-10-CM | POA: Diagnosis not present

## 2016-06-05 DIAGNOSIS — Z96642 Presence of left artificial hip joint: Secondary | ICD-10-CM | POA: Diagnosis not present

## 2016-06-05 DIAGNOSIS — G9340 Encephalopathy, unspecified: Secondary | ICD-10-CM | POA: Diagnosis not present

## 2016-06-05 DIAGNOSIS — M6281 Muscle weakness (generalized): Secondary | ICD-10-CM | POA: Diagnosis not present

## 2016-06-05 DIAGNOSIS — R1310 Dysphagia, unspecified: Secondary | ICD-10-CM | POA: Diagnosis not present

## 2016-06-05 DIAGNOSIS — Z9889 Other specified postprocedural states: Secondary | ICD-10-CM | POA: Diagnosis not present

## 2016-06-06 DIAGNOSIS — G9340 Encephalopathy, unspecified: Secondary | ICD-10-CM | POA: Diagnosis not present

## 2016-06-06 DIAGNOSIS — Z96642 Presence of left artificial hip joint: Secondary | ICD-10-CM | POA: Diagnosis not present

## 2016-06-06 DIAGNOSIS — Z9889 Other specified postprocedural states: Secondary | ICD-10-CM | POA: Diagnosis not present

## 2016-06-06 DIAGNOSIS — M6281 Muscle weakness (generalized): Secondary | ICD-10-CM | POA: Diagnosis not present

## 2016-06-06 DIAGNOSIS — R1310 Dysphagia, unspecified: Secondary | ICD-10-CM | POA: Diagnosis not present

## 2016-06-07 DIAGNOSIS — G9340 Encephalopathy, unspecified: Secondary | ICD-10-CM | POA: Diagnosis not present

## 2016-06-07 DIAGNOSIS — Z96642 Presence of left artificial hip joint: Secondary | ICD-10-CM | POA: Diagnosis not present

## 2016-06-07 DIAGNOSIS — Z9889 Other specified postprocedural states: Secondary | ICD-10-CM | POA: Diagnosis not present

## 2016-06-07 DIAGNOSIS — M6281 Muscle weakness (generalized): Secondary | ICD-10-CM | POA: Diagnosis not present

## 2016-06-07 DIAGNOSIS — R1310 Dysphagia, unspecified: Secondary | ICD-10-CM | POA: Diagnosis not present

## 2016-06-08 DIAGNOSIS — M6281 Muscle weakness (generalized): Secondary | ICD-10-CM | POA: Diagnosis not present

## 2016-06-08 DIAGNOSIS — Z96642 Presence of left artificial hip joint: Secondary | ICD-10-CM | POA: Diagnosis not present

## 2016-06-08 DIAGNOSIS — G9340 Encephalopathy, unspecified: Secondary | ICD-10-CM | POA: Diagnosis not present

## 2016-06-08 DIAGNOSIS — Z9889 Other specified postprocedural states: Secondary | ICD-10-CM | POA: Diagnosis not present

## 2016-06-08 DIAGNOSIS — R1310 Dysphagia, unspecified: Secondary | ICD-10-CM | POA: Diagnosis not present

## 2016-06-11 DIAGNOSIS — Z96642 Presence of left artificial hip joint: Secondary | ICD-10-CM | POA: Diagnosis not present

## 2016-06-11 DIAGNOSIS — G9340 Encephalopathy, unspecified: Secondary | ICD-10-CM | POA: Diagnosis not present

## 2016-06-11 DIAGNOSIS — T8459XA Infection and inflammatory reaction due to other internal joint prosthesis, initial encounter: Secondary | ICD-10-CM | POA: Diagnosis not present

## 2016-06-11 DIAGNOSIS — L89159 Pressure ulcer of sacral region, unspecified stage: Secondary | ICD-10-CM | POA: Diagnosis not present

## 2016-06-11 DIAGNOSIS — B372 Candidiasis of skin and nail: Secondary | ICD-10-CM | POA: Diagnosis not present

## 2016-06-11 DIAGNOSIS — R1310 Dysphagia, unspecified: Secondary | ICD-10-CM | POA: Diagnosis not present

## 2016-06-11 DIAGNOSIS — M79606 Pain in leg, unspecified: Secondary | ICD-10-CM | POA: Diagnosis not present

## 2016-06-11 DIAGNOSIS — Z9889 Other specified postprocedural states: Secondary | ICD-10-CM | POA: Diagnosis not present

## 2016-06-11 DIAGNOSIS — M6281 Muscle weakness (generalized): Secondary | ICD-10-CM | POA: Diagnosis not present

## 2016-06-12 DIAGNOSIS — Z96642 Presence of left artificial hip joint: Secondary | ICD-10-CM | POA: Diagnosis not present

## 2016-06-12 DIAGNOSIS — R1310 Dysphagia, unspecified: Secondary | ICD-10-CM | POA: Diagnosis not present

## 2016-06-12 DIAGNOSIS — D649 Anemia, unspecified: Secondary | ICD-10-CM | POA: Diagnosis not present

## 2016-06-12 DIAGNOSIS — M24452 Recurrent dislocation, left hip: Secondary | ICD-10-CM | POA: Diagnosis not present

## 2016-06-12 DIAGNOSIS — M6281 Muscle weakness (generalized): Secondary | ICD-10-CM | POA: Diagnosis not present

## 2016-06-12 DIAGNOSIS — Z9889 Other specified postprocedural states: Secondary | ICD-10-CM | POA: Diagnosis not present

## 2016-06-12 DIAGNOSIS — A4902 Methicillin resistant Staphylococcus aureus infection, unspecified site: Secondary | ICD-10-CM | POA: Diagnosis not present

## 2016-06-12 DIAGNOSIS — A498 Other bacterial infections of unspecified site: Secondary | ICD-10-CM | POA: Diagnosis not present

## 2016-06-12 DIAGNOSIS — G9340 Encephalopathy, unspecified: Secondary | ICD-10-CM | POA: Diagnosis not present

## 2016-06-12 DIAGNOSIS — M2550 Pain in unspecified joint: Secondary | ICD-10-CM | POA: Diagnosis not present

## 2016-06-13 DIAGNOSIS — G9340 Encephalopathy, unspecified: Secondary | ICD-10-CM | POA: Diagnosis not present

## 2016-06-13 DIAGNOSIS — Z9889 Other specified postprocedural states: Secondary | ICD-10-CM | POA: Diagnosis not present

## 2016-06-13 DIAGNOSIS — M6281 Muscle weakness (generalized): Secondary | ICD-10-CM | POA: Diagnosis not present

## 2016-06-13 DIAGNOSIS — R1310 Dysphagia, unspecified: Secondary | ICD-10-CM | POA: Diagnosis not present

## 2016-06-13 DIAGNOSIS — Z96642 Presence of left artificial hip joint: Secondary | ICD-10-CM | POA: Diagnosis not present

## 2016-06-14 DIAGNOSIS — B372 Candidiasis of skin and nail: Secondary | ICD-10-CM | POA: Diagnosis not present

## 2016-06-14 DIAGNOSIS — L853 Xerosis cutis: Secondary | ICD-10-CM | POA: Diagnosis not present

## 2016-06-14 DIAGNOSIS — R1310 Dysphagia, unspecified: Secondary | ICD-10-CM | POA: Diagnosis not present

## 2016-06-14 DIAGNOSIS — G9340 Encephalopathy, unspecified: Secondary | ICD-10-CM | POA: Diagnosis not present

## 2016-06-14 DIAGNOSIS — Z9889 Other specified postprocedural states: Secondary | ICD-10-CM | POA: Diagnosis not present

## 2016-06-14 DIAGNOSIS — L89159 Pressure ulcer of sacral region, unspecified stage: Secondary | ICD-10-CM | POA: Diagnosis not present

## 2016-06-14 DIAGNOSIS — Z96642 Presence of left artificial hip joint: Secondary | ICD-10-CM | POA: Diagnosis not present

## 2016-06-14 DIAGNOSIS — M6281 Muscle weakness (generalized): Secondary | ICD-10-CM | POA: Diagnosis not present

## 2016-06-14 DIAGNOSIS — L298 Other pruritus: Secondary | ICD-10-CM | POA: Diagnosis not present

## 2016-06-15 DIAGNOSIS — R1310 Dysphagia, unspecified: Secondary | ICD-10-CM | POA: Diagnosis not present

## 2016-06-15 DIAGNOSIS — Z96642 Presence of left artificial hip joint: Secondary | ICD-10-CM | POA: Diagnosis not present

## 2016-06-15 DIAGNOSIS — M6281 Muscle weakness (generalized): Secondary | ICD-10-CM | POA: Diagnosis not present

## 2016-06-15 DIAGNOSIS — Z9889 Other specified postprocedural states: Secondary | ICD-10-CM | POA: Diagnosis not present

## 2016-06-15 DIAGNOSIS — G9340 Encephalopathy, unspecified: Secondary | ICD-10-CM | POA: Diagnosis not present

## 2016-06-17 DIAGNOSIS — M6281 Muscle weakness (generalized): Secondary | ICD-10-CM | POA: Diagnosis not present

## 2016-06-17 DIAGNOSIS — Z9889 Other specified postprocedural states: Secondary | ICD-10-CM | POA: Diagnosis not present

## 2016-06-17 DIAGNOSIS — G9340 Encephalopathy, unspecified: Secondary | ICD-10-CM | POA: Diagnosis not present

## 2016-06-17 DIAGNOSIS — Z96642 Presence of left artificial hip joint: Secondary | ICD-10-CM | POA: Diagnosis not present

## 2016-06-17 DIAGNOSIS — R1310 Dysphagia, unspecified: Secondary | ICD-10-CM | POA: Diagnosis not present

## 2016-06-18 DIAGNOSIS — Z96642 Presence of left artificial hip joint: Secondary | ICD-10-CM | POA: Diagnosis not present

## 2016-06-18 DIAGNOSIS — Z9889 Other specified postprocedural states: Secondary | ICD-10-CM | POA: Diagnosis not present

## 2016-06-18 DIAGNOSIS — E039 Hypothyroidism, unspecified: Secondary | ICD-10-CM | POA: Diagnosis not present

## 2016-06-18 DIAGNOSIS — Z4732 Aftercare following explantation of hip joint prosthesis: Secondary | ICD-10-CM | POA: Diagnosis not present

## 2016-06-18 DIAGNOSIS — R1310 Dysphagia, unspecified: Secondary | ICD-10-CM | POA: Diagnosis not present

## 2016-06-18 DIAGNOSIS — G9340 Encephalopathy, unspecified: Secondary | ICD-10-CM | POA: Diagnosis not present

## 2016-06-18 DIAGNOSIS — M6281 Muscle weakness (generalized): Secondary | ICD-10-CM | POA: Diagnosis not present

## 2016-06-18 DIAGNOSIS — B372 Candidiasis of skin and nail: Secondary | ICD-10-CM | POA: Diagnosis not present

## 2016-06-19 DIAGNOSIS — G9340 Encephalopathy, unspecified: Secondary | ICD-10-CM | POA: Diagnosis not present

## 2016-06-19 DIAGNOSIS — R1310 Dysphagia, unspecified: Secondary | ICD-10-CM | POA: Diagnosis not present

## 2016-06-19 DIAGNOSIS — Z96642 Presence of left artificial hip joint: Secondary | ICD-10-CM | POA: Diagnosis not present

## 2016-06-19 DIAGNOSIS — M6281 Muscle weakness (generalized): Secondary | ICD-10-CM | POA: Diagnosis not present

## 2016-06-19 DIAGNOSIS — Z9889 Other specified postprocedural states: Secondary | ICD-10-CM | POA: Diagnosis not present

## 2016-06-20 DIAGNOSIS — M25552 Pain in left hip: Secondary | ICD-10-CM | POA: Diagnosis not present

## 2016-06-20 DIAGNOSIS — Z9889 Other specified postprocedural states: Secondary | ICD-10-CM | POA: Diagnosis not present

## 2016-06-20 DIAGNOSIS — M6281 Muscle weakness (generalized): Secondary | ICD-10-CM | POA: Diagnosis not present

## 2016-06-20 DIAGNOSIS — R1310 Dysphagia, unspecified: Secondary | ICD-10-CM | POA: Diagnosis not present

## 2016-06-20 DIAGNOSIS — G9340 Encephalopathy, unspecified: Secondary | ICD-10-CM | POA: Diagnosis not present

## 2016-06-20 DIAGNOSIS — Z471 Aftercare following joint replacement surgery: Secondary | ICD-10-CM | POA: Diagnosis not present

## 2016-06-20 DIAGNOSIS — Z96642 Presence of left artificial hip joint: Secondary | ICD-10-CM | POA: Diagnosis not present

## 2016-06-21 DIAGNOSIS — G9340 Encephalopathy, unspecified: Secondary | ICD-10-CM | POA: Diagnosis not present

## 2016-06-21 DIAGNOSIS — B372 Candidiasis of skin and nail: Secondary | ICD-10-CM | POA: Diagnosis not present

## 2016-06-21 DIAGNOSIS — Z96642 Presence of left artificial hip joint: Secondary | ICD-10-CM | POA: Diagnosis not present

## 2016-06-21 DIAGNOSIS — Z4732 Aftercare following explantation of hip joint prosthesis: Secondary | ICD-10-CM | POA: Diagnosis not present

## 2016-06-21 DIAGNOSIS — M6281 Muscle weakness (generalized): Secondary | ICD-10-CM | POA: Diagnosis not present

## 2016-06-21 DIAGNOSIS — R1310 Dysphagia, unspecified: Secondary | ICD-10-CM | POA: Diagnosis not present

## 2016-06-21 DIAGNOSIS — Z9889 Other specified postprocedural states: Secondary | ICD-10-CM | POA: Diagnosis not present

## 2016-06-22 DIAGNOSIS — R1310 Dysphagia, unspecified: Secondary | ICD-10-CM | POA: Diagnosis not present

## 2016-06-22 DIAGNOSIS — Z96642 Presence of left artificial hip joint: Secondary | ICD-10-CM | POA: Diagnosis not present

## 2016-06-22 DIAGNOSIS — Z9889 Other specified postprocedural states: Secondary | ICD-10-CM | POA: Diagnosis not present

## 2016-06-22 DIAGNOSIS — M6281 Muscle weakness (generalized): Secondary | ICD-10-CM | POA: Diagnosis not present

## 2016-06-22 DIAGNOSIS — G9340 Encephalopathy, unspecified: Secondary | ICD-10-CM | POA: Diagnosis not present

## 2016-06-23 DIAGNOSIS — Z96642 Presence of left artificial hip joint: Secondary | ICD-10-CM | POA: Diagnosis not present

## 2016-06-23 DIAGNOSIS — M6281 Muscle weakness (generalized): Secondary | ICD-10-CM | POA: Diagnosis not present

## 2016-06-23 DIAGNOSIS — Z9889 Other specified postprocedural states: Secondary | ICD-10-CM | POA: Diagnosis not present

## 2016-06-23 DIAGNOSIS — G9340 Encephalopathy, unspecified: Secondary | ICD-10-CM | POA: Diagnosis not present

## 2016-06-23 DIAGNOSIS — R1310 Dysphagia, unspecified: Secondary | ICD-10-CM | POA: Diagnosis not present

## 2016-06-24 DIAGNOSIS — R1310 Dysphagia, unspecified: Secondary | ICD-10-CM | POA: Diagnosis not present

## 2016-06-24 DIAGNOSIS — Z96642 Presence of left artificial hip joint: Secondary | ICD-10-CM | POA: Diagnosis not present

## 2016-06-24 DIAGNOSIS — Z9889 Other specified postprocedural states: Secondary | ICD-10-CM | POA: Diagnosis not present

## 2016-06-24 DIAGNOSIS — G9340 Encephalopathy, unspecified: Secondary | ICD-10-CM | POA: Diagnosis not present

## 2016-06-24 DIAGNOSIS — M6281 Muscle weakness (generalized): Secondary | ICD-10-CM | POA: Diagnosis not present

## 2016-06-25 DIAGNOSIS — Z96642 Presence of left artificial hip joint: Secondary | ICD-10-CM | POA: Diagnosis not present

## 2016-06-25 DIAGNOSIS — M6281 Muscle weakness (generalized): Secondary | ICD-10-CM | POA: Diagnosis not present

## 2016-06-25 DIAGNOSIS — R1310 Dysphagia, unspecified: Secondary | ICD-10-CM | POA: Diagnosis not present

## 2016-06-25 DIAGNOSIS — G9340 Encephalopathy, unspecified: Secondary | ICD-10-CM | POA: Diagnosis not present

## 2016-06-25 DIAGNOSIS — Z9889 Other specified postprocedural states: Secondary | ICD-10-CM | POA: Diagnosis not present

## 2016-06-26 DIAGNOSIS — Z96642 Presence of left artificial hip joint: Secondary | ICD-10-CM | POA: Diagnosis not present

## 2016-06-26 DIAGNOSIS — G9340 Encephalopathy, unspecified: Secondary | ICD-10-CM | POA: Diagnosis not present

## 2016-06-26 DIAGNOSIS — M6281 Muscle weakness (generalized): Secondary | ICD-10-CM | POA: Diagnosis not present

## 2016-06-26 DIAGNOSIS — R1310 Dysphagia, unspecified: Secondary | ICD-10-CM | POA: Diagnosis not present

## 2016-06-26 DIAGNOSIS — Z9889 Other specified postprocedural states: Secondary | ICD-10-CM | POA: Diagnosis not present

## 2016-06-27 DIAGNOSIS — G9340 Encephalopathy, unspecified: Secondary | ICD-10-CM | POA: Diagnosis not present

## 2016-06-27 DIAGNOSIS — M6281 Muscle weakness (generalized): Secondary | ICD-10-CM | POA: Diagnosis not present

## 2016-06-27 DIAGNOSIS — Z9889 Other specified postprocedural states: Secondary | ICD-10-CM | POA: Diagnosis not present

## 2016-06-27 DIAGNOSIS — R1310 Dysphagia, unspecified: Secondary | ICD-10-CM | POA: Diagnosis not present

## 2016-06-27 DIAGNOSIS — Z96642 Presence of left artificial hip joint: Secondary | ICD-10-CM | POA: Diagnosis not present

## 2016-06-28 DIAGNOSIS — M6281 Muscle weakness (generalized): Secondary | ICD-10-CM | POA: Diagnosis not present

## 2016-06-28 DIAGNOSIS — G9340 Encephalopathy, unspecified: Secondary | ICD-10-CM | POA: Diagnosis not present

## 2016-06-28 DIAGNOSIS — Z9889 Other specified postprocedural states: Secondary | ICD-10-CM | POA: Diagnosis not present

## 2016-06-28 DIAGNOSIS — Z96642 Presence of left artificial hip joint: Secondary | ICD-10-CM | POA: Diagnosis not present

## 2016-06-28 DIAGNOSIS — R1310 Dysphagia, unspecified: Secondary | ICD-10-CM | POA: Diagnosis not present

## 2016-06-29 DIAGNOSIS — Z96642 Presence of left artificial hip joint: Secondary | ICD-10-CM | POA: Diagnosis not present

## 2016-06-29 DIAGNOSIS — M6281 Muscle weakness (generalized): Secondary | ICD-10-CM | POA: Diagnosis not present

## 2016-06-29 DIAGNOSIS — G9340 Encephalopathy, unspecified: Secondary | ICD-10-CM | POA: Diagnosis not present

## 2016-06-29 DIAGNOSIS — R1310 Dysphagia, unspecified: Secondary | ICD-10-CM | POA: Diagnosis not present

## 2016-06-29 DIAGNOSIS — Z9889 Other specified postprocedural states: Secondary | ICD-10-CM | POA: Diagnosis not present

## 2016-06-30 DIAGNOSIS — G9340 Encephalopathy, unspecified: Secondary | ICD-10-CM | POA: Diagnosis not present

## 2016-06-30 DIAGNOSIS — R1310 Dysphagia, unspecified: Secondary | ICD-10-CM | POA: Diagnosis not present

## 2016-06-30 DIAGNOSIS — Z9889 Other specified postprocedural states: Secondary | ICD-10-CM | POA: Diagnosis not present

## 2016-06-30 DIAGNOSIS — Z96642 Presence of left artificial hip joint: Secondary | ICD-10-CM | POA: Diagnosis not present

## 2016-06-30 DIAGNOSIS — M6281 Muscle weakness (generalized): Secondary | ICD-10-CM | POA: Diagnosis not present

## 2016-07-01 DIAGNOSIS — Z9889 Other specified postprocedural states: Secondary | ICD-10-CM | POA: Diagnosis not present

## 2016-07-01 DIAGNOSIS — Z96642 Presence of left artificial hip joint: Secondary | ICD-10-CM | POA: Diagnosis not present

## 2016-07-01 DIAGNOSIS — M6281 Muscle weakness (generalized): Secondary | ICD-10-CM | POA: Diagnosis not present

## 2016-07-01 DIAGNOSIS — G9340 Encephalopathy, unspecified: Secondary | ICD-10-CM | POA: Diagnosis not present

## 2016-07-01 DIAGNOSIS — R1310 Dysphagia, unspecified: Secondary | ICD-10-CM | POA: Diagnosis not present

## 2016-07-02 DIAGNOSIS — R1310 Dysphagia, unspecified: Secondary | ICD-10-CM | POA: Diagnosis not present

## 2016-07-02 DIAGNOSIS — Z9889 Other specified postprocedural states: Secondary | ICD-10-CM | POA: Diagnosis not present

## 2016-07-02 DIAGNOSIS — Z96642 Presence of left artificial hip joint: Secondary | ICD-10-CM | POA: Diagnosis not present

## 2016-07-02 DIAGNOSIS — G9340 Encephalopathy, unspecified: Secondary | ICD-10-CM | POA: Diagnosis not present

## 2016-07-02 DIAGNOSIS — M6281 Muscle weakness (generalized): Secondary | ICD-10-CM | POA: Diagnosis not present

## 2016-07-03 DIAGNOSIS — R1310 Dysphagia, unspecified: Secondary | ICD-10-CM | POA: Diagnosis not present

## 2016-07-03 DIAGNOSIS — M6281 Muscle weakness (generalized): Secondary | ICD-10-CM | POA: Diagnosis not present

## 2016-07-03 DIAGNOSIS — Z96642 Presence of left artificial hip joint: Secondary | ICD-10-CM | POA: Diagnosis not present

## 2016-07-03 DIAGNOSIS — Z9889 Other specified postprocedural states: Secondary | ICD-10-CM | POA: Diagnosis not present

## 2016-07-03 DIAGNOSIS — G9340 Encephalopathy, unspecified: Secondary | ICD-10-CM | POA: Diagnosis not present

## 2016-07-04 DIAGNOSIS — R1310 Dysphagia, unspecified: Secondary | ICD-10-CM | POA: Diagnosis not present

## 2016-07-04 DIAGNOSIS — G9340 Encephalopathy, unspecified: Secondary | ICD-10-CM | POA: Diagnosis not present

## 2016-07-04 DIAGNOSIS — M6281 Muscle weakness (generalized): Secondary | ICD-10-CM | POA: Diagnosis not present

## 2016-07-04 DIAGNOSIS — Z9889 Other specified postprocedural states: Secondary | ICD-10-CM | POA: Diagnosis not present

## 2016-07-04 DIAGNOSIS — Z96642 Presence of left artificial hip joint: Secondary | ICD-10-CM | POA: Diagnosis not present

## 2016-07-05 DIAGNOSIS — Z9889 Other specified postprocedural states: Secondary | ICD-10-CM | POA: Diagnosis not present

## 2016-07-05 DIAGNOSIS — Z96642 Presence of left artificial hip joint: Secondary | ICD-10-CM | POA: Diagnosis not present

## 2016-07-05 DIAGNOSIS — G9340 Encephalopathy, unspecified: Secondary | ICD-10-CM | POA: Diagnosis not present

## 2016-07-05 DIAGNOSIS — M6281 Muscle weakness (generalized): Secondary | ICD-10-CM | POA: Diagnosis not present

## 2016-07-05 DIAGNOSIS — R1310 Dysphagia, unspecified: Secondary | ICD-10-CM | POA: Diagnosis not present

## 2016-07-06 DIAGNOSIS — Z9889 Other specified postprocedural states: Secondary | ICD-10-CM | POA: Diagnosis not present

## 2016-07-06 DIAGNOSIS — M6281 Muscle weakness (generalized): Secondary | ICD-10-CM | POA: Diagnosis not present

## 2016-07-06 DIAGNOSIS — R1310 Dysphagia, unspecified: Secondary | ICD-10-CM | POA: Diagnosis not present

## 2016-07-06 DIAGNOSIS — G9340 Encephalopathy, unspecified: Secondary | ICD-10-CM | POA: Diagnosis not present

## 2016-07-06 DIAGNOSIS — Z96642 Presence of left artificial hip joint: Secondary | ICD-10-CM | POA: Diagnosis not present

## 2016-07-08 DIAGNOSIS — Z96642 Presence of left artificial hip joint: Secondary | ICD-10-CM | POA: Diagnosis not present

## 2016-07-08 DIAGNOSIS — M6281 Muscle weakness (generalized): Secondary | ICD-10-CM | POA: Diagnosis not present

## 2016-07-08 DIAGNOSIS — Z9889 Other specified postprocedural states: Secondary | ICD-10-CM | POA: Diagnosis not present

## 2016-07-08 DIAGNOSIS — R1310 Dysphagia, unspecified: Secondary | ICD-10-CM | POA: Diagnosis not present

## 2016-07-08 DIAGNOSIS — G9340 Encephalopathy, unspecified: Secondary | ICD-10-CM | POA: Diagnosis not present

## 2016-07-09 DIAGNOSIS — Z9889 Other specified postprocedural states: Secondary | ICD-10-CM | POA: Diagnosis not present

## 2016-07-09 DIAGNOSIS — R1310 Dysphagia, unspecified: Secondary | ICD-10-CM | POA: Diagnosis not present

## 2016-07-09 DIAGNOSIS — G9340 Encephalopathy, unspecified: Secondary | ICD-10-CM | POA: Diagnosis not present

## 2016-07-09 DIAGNOSIS — Z96642 Presence of left artificial hip joint: Secondary | ICD-10-CM | POA: Diagnosis not present

## 2016-07-09 DIAGNOSIS — M6281 Muscle weakness (generalized): Secondary | ICD-10-CM | POA: Diagnosis not present

## 2016-07-10 DIAGNOSIS — G9340 Encephalopathy, unspecified: Secondary | ICD-10-CM | POA: Diagnosis not present

## 2016-07-10 DIAGNOSIS — M6281 Muscle weakness (generalized): Secondary | ICD-10-CM | POA: Diagnosis not present

## 2016-07-10 DIAGNOSIS — Z96642 Presence of left artificial hip joint: Secondary | ICD-10-CM | POA: Diagnosis not present

## 2016-07-10 DIAGNOSIS — R1310 Dysphagia, unspecified: Secondary | ICD-10-CM | POA: Diagnosis not present

## 2016-07-10 DIAGNOSIS — Z9889 Other specified postprocedural states: Secondary | ICD-10-CM | POA: Diagnosis not present

## 2016-07-11 DIAGNOSIS — R1310 Dysphagia, unspecified: Secondary | ICD-10-CM | POA: Diagnosis not present

## 2016-07-11 DIAGNOSIS — Z9889 Other specified postprocedural states: Secondary | ICD-10-CM | POA: Diagnosis not present

## 2016-07-11 DIAGNOSIS — Z471 Aftercare following joint replacement surgery: Secondary | ICD-10-CM | POA: Diagnosis not present

## 2016-07-11 DIAGNOSIS — M6281 Muscle weakness (generalized): Secondary | ICD-10-CM | POA: Diagnosis not present

## 2016-07-11 DIAGNOSIS — G9340 Encephalopathy, unspecified: Secondary | ICD-10-CM | POA: Diagnosis not present

## 2016-07-11 DIAGNOSIS — T8459XD Infection and inflammatory reaction due to other internal joint prosthesis, subsequent encounter: Secondary | ICD-10-CM | POA: Diagnosis not present

## 2016-07-11 DIAGNOSIS — Z96642 Presence of left artificial hip joint: Secondary | ICD-10-CM | POA: Diagnosis not present

## 2016-07-12 ENCOUNTER — Other Ambulatory Visit (HOSPITAL_COMMUNITY): Payer: Self-pay | Admitting: Orthopedic Surgery

## 2016-07-12 DIAGNOSIS — Z471 Aftercare following joint replacement surgery: Secondary | ICD-10-CM

## 2016-07-12 DIAGNOSIS — R1310 Dysphagia, unspecified: Secondary | ICD-10-CM | POA: Diagnosis not present

## 2016-07-12 DIAGNOSIS — Z9889 Other specified postprocedural states: Secondary | ICD-10-CM | POA: Diagnosis not present

## 2016-07-12 DIAGNOSIS — M6281 Muscle weakness (generalized): Secondary | ICD-10-CM | POA: Diagnosis not present

## 2016-07-12 DIAGNOSIS — G9340 Encephalopathy, unspecified: Secondary | ICD-10-CM | POA: Diagnosis not present

## 2016-07-12 DIAGNOSIS — Z96642 Presence of left artificial hip joint: Secondary | ICD-10-CM | POA: Diagnosis not present

## 2016-07-13 DIAGNOSIS — Z9889 Other specified postprocedural states: Secondary | ICD-10-CM | POA: Diagnosis not present

## 2016-07-13 DIAGNOSIS — M6281 Muscle weakness (generalized): Secondary | ICD-10-CM | POA: Diagnosis not present

## 2016-07-13 DIAGNOSIS — R1310 Dysphagia, unspecified: Secondary | ICD-10-CM | POA: Diagnosis not present

## 2016-07-13 DIAGNOSIS — G9340 Encephalopathy, unspecified: Secondary | ICD-10-CM | POA: Diagnosis not present

## 2016-07-13 DIAGNOSIS — Z96642 Presence of left artificial hip joint: Secondary | ICD-10-CM | POA: Diagnosis not present

## 2016-07-15 DIAGNOSIS — G9340 Encephalopathy, unspecified: Secondary | ICD-10-CM | POA: Diagnosis not present

## 2016-07-15 DIAGNOSIS — R1310 Dysphagia, unspecified: Secondary | ICD-10-CM | POA: Diagnosis not present

## 2016-07-15 DIAGNOSIS — M6281 Muscle weakness (generalized): Secondary | ICD-10-CM | POA: Diagnosis not present

## 2016-07-15 DIAGNOSIS — Z96642 Presence of left artificial hip joint: Secondary | ICD-10-CM | POA: Diagnosis not present

## 2016-07-15 DIAGNOSIS — Z9889 Other specified postprocedural states: Secondary | ICD-10-CM | POA: Diagnosis not present

## 2016-07-16 DIAGNOSIS — Z9889 Other specified postprocedural states: Secondary | ICD-10-CM | POA: Diagnosis not present

## 2016-07-16 DIAGNOSIS — Z96642 Presence of left artificial hip joint: Secondary | ICD-10-CM | POA: Diagnosis not present

## 2016-07-16 DIAGNOSIS — R1310 Dysphagia, unspecified: Secondary | ICD-10-CM | POA: Diagnosis not present

## 2016-07-16 DIAGNOSIS — M6281 Muscle weakness (generalized): Secondary | ICD-10-CM | POA: Diagnosis not present

## 2016-07-16 DIAGNOSIS — G9340 Encephalopathy, unspecified: Secondary | ICD-10-CM | POA: Diagnosis not present

## 2016-07-17 ENCOUNTER — Ambulatory Visit (HOSPITAL_COMMUNITY)
Admission: RE | Admit: 2016-07-17 | Discharge: 2016-07-17 | Disposition: A | Discharge status: Home / Self Care | Payer: Medicare Other | Source: Ambulatory Visit | Attending: Orthopedic Surgery | Admitting: Orthopedic Surgery

## 2016-07-17 DIAGNOSIS — Z471 Aftercare following joint replacement surgery: Secondary | ICD-10-CM

## 2016-07-17 DIAGNOSIS — G9340 Encephalopathy, unspecified: Secondary | ICD-10-CM | POA: Diagnosis not present

## 2016-07-17 DIAGNOSIS — M6281 Muscle weakness (generalized): Secondary | ICD-10-CM | POA: Diagnosis not present

## 2016-07-17 DIAGNOSIS — Z96642 Presence of left artificial hip joint: Secondary | ICD-10-CM | POA: Insufficient documentation

## 2016-07-17 DIAGNOSIS — R600 Localized edema: Secondary | ICD-10-CM | POA: Diagnosis not present

## 2016-07-17 DIAGNOSIS — R1310 Dysphagia, unspecified: Secondary | ICD-10-CM | POA: Diagnosis not present

## 2016-07-17 DIAGNOSIS — Z9889 Other specified postprocedural states: Secondary | ICD-10-CM | POA: Diagnosis not present

## 2016-07-18 DIAGNOSIS — R1310 Dysphagia, unspecified: Secondary | ICD-10-CM | POA: Diagnosis not present

## 2016-07-18 DIAGNOSIS — Z9889 Other specified postprocedural states: Secondary | ICD-10-CM | POA: Diagnosis not present

## 2016-07-18 DIAGNOSIS — Z96642 Presence of left artificial hip joint: Secondary | ICD-10-CM | POA: Diagnosis not present

## 2016-07-18 DIAGNOSIS — M6281 Muscle weakness (generalized): Secondary | ICD-10-CM | POA: Diagnosis not present

## 2016-07-18 DIAGNOSIS — G9340 Encephalopathy, unspecified: Secondary | ICD-10-CM | POA: Diagnosis not present

## 2016-07-19 DIAGNOSIS — G9340 Encephalopathy, unspecified: Secondary | ICD-10-CM | POA: Diagnosis not present

## 2016-07-19 DIAGNOSIS — M6281 Muscle weakness (generalized): Secondary | ICD-10-CM | POA: Diagnosis not present

## 2016-07-19 DIAGNOSIS — Z9889 Other specified postprocedural states: Secondary | ICD-10-CM | POA: Diagnosis not present

## 2016-07-19 DIAGNOSIS — Z96642 Presence of left artificial hip joint: Secondary | ICD-10-CM | POA: Diagnosis not present

## 2016-07-19 DIAGNOSIS — R1310 Dysphagia, unspecified: Secondary | ICD-10-CM | POA: Diagnosis not present

## 2016-07-20 DIAGNOSIS — Z9889 Other specified postprocedural states: Secondary | ICD-10-CM | POA: Diagnosis not present

## 2016-07-20 DIAGNOSIS — M6281 Muscle weakness (generalized): Secondary | ICD-10-CM | POA: Diagnosis not present

## 2016-07-20 DIAGNOSIS — G9340 Encephalopathy, unspecified: Secondary | ICD-10-CM | POA: Diagnosis not present

## 2016-07-20 DIAGNOSIS — Z96642 Presence of left artificial hip joint: Secondary | ICD-10-CM | POA: Diagnosis not present

## 2016-07-20 DIAGNOSIS — R1310 Dysphagia, unspecified: Secondary | ICD-10-CM | POA: Diagnosis not present

## 2016-07-21 DIAGNOSIS — Z9889 Other specified postprocedural states: Secondary | ICD-10-CM | POA: Diagnosis not present

## 2016-07-21 DIAGNOSIS — R1310 Dysphagia, unspecified: Secondary | ICD-10-CM | POA: Diagnosis not present

## 2016-07-21 DIAGNOSIS — G9340 Encephalopathy, unspecified: Secondary | ICD-10-CM | POA: Diagnosis not present

## 2016-07-21 DIAGNOSIS — Z96642 Presence of left artificial hip joint: Secondary | ICD-10-CM | POA: Diagnosis not present

## 2016-07-21 DIAGNOSIS — M6281 Muscle weakness (generalized): Secondary | ICD-10-CM | POA: Diagnosis not present

## 2016-07-22 DIAGNOSIS — M6281 Muscle weakness (generalized): Secondary | ICD-10-CM | POA: Diagnosis not present

## 2016-07-22 DIAGNOSIS — Z9889 Other specified postprocedural states: Secondary | ICD-10-CM | POA: Diagnosis not present

## 2016-07-22 DIAGNOSIS — G9340 Encephalopathy, unspecified: Secondary | ICD-10-CM | POA: Diagnosis not present

## 2016-07-22 DIAGNOSIS — Z96642 Presence of left artificial hip joint: Secondary | ICD-10-CM | POA: Diagnosis not present

## 2016-07-22 DIAGNOSIS — R1310 Dysphagia, unspecified: Secondary | ICD-10-CM | POA: Diagnosis not present

## 2016-07-23 DIAGNOSIS — M6281 Muscle weakness (generalized): Secondary | ICD-10-CM | POA: Diagnosis not present

## 2016-07-23 DIAGNOSIS — Z96642 Presence of left artificial hip joint: Secondary | ICD-10-CM | POA: Diagnosis not present

## 2016-07-23 DIAGNOSIS — G9340 Encephalopathy, unspecified: Secondary | ICD-10-CM | POA: Diagnosis not present

## 2016-07-23 DIAGNOSIS — R1310 Dysphagia, unspecified: Secondary | ICD-10-CM | POA: Diagnosis not present

## 2016-07-23 DIAGNOSIS — Z9889 Other specified postprocedural states: Secondary | ICD-10-CM | POA: Diagnosis not present

## 2016-07-24 DIAGNOSIS — G9340 Encephalopathy, unspecified: Secondary | ICD-10-CM | POA: Diagnosis not present

## 2016-07-24 DIAGNOSIS — R1310 Dysphagia, unspecified: Secondary | ICD-10-CM | POA: Diagnosis not present

## 2016-07-24 DIAGNOSIS — Z9889 Other specified postprocedural states: Secondary | ICD-10-CM | POA: Diagnosis not present

## 2016-07-24 DIAGNOSIS — Z96642 Presence of left artificial hip joint: Secondary | ICD-10-CM | POA: Diagnosis not present

## 2016-07-24 DIAGNOSIS — M79674 Pain in right toe(s): Secondary | ICD-10-CM | POA: Diagnosis not present

## 2016-07-24 DIAGNOSIS — M6281 Muscle weakness (generalized): Secondary | ICD-10-CM | POA: Diagnosis not present

## 2016-07-24 DIAGNOSIS — B351 Tinea unguium: Secondary | ICD-10-CM | POA: Diagnosis not present

## 2016-07-25 DIAGNOSIS — G9340 Encephalopathy, unspecified: Secondary | ICD-10-CM | POA: Diagnosis not present

## 2016-07-25 DIAGNOSIS — R1310 Dysphagia, unspecified: Secondary | ICD-10-CM | POA: Diagnosis not present

## 2016-07-25 DIAGNOSIS — Z9889 Other specified postprocedural states: Secondary | ICD-10-CM | POA: Diagnosis not present

## 2016-07-25 DIAGNOSIS — Z96642 Presence of left artificial hip joint: Secondary | ICD-10-CM | POA: Diagnosis not present

## 2016-07-25 DIAGNOSIS — M6281 Muscle weakness (generalized): Secondary | ICD-10-CM | POA: Diagnosis not present

## 2016-07-26 DIAGNOSIS — M6281 Muscle weakness (generalized): Secondary | ICD-10-CM | POA: Diagnosis not present

## 2016-07-26 DIAGNOSIS — Z9889 Other specified postprocedural states: Secondary | ICD-10-CM | POA: Diagnosis not present

## 2016-07-26 DIAGNOSIS — G9340 Encephalopathy, unspecified: Secondary | ICD-10-CM | POA: Diagnosis not present

## 2016-07-26 DIAGNOSIS — Z96642 Presence of left artificial hip joint: Secondary | ICD-10-CM | POA: Diagnosis not present

## 2016-07-26 DIAGNOSIS — R1310 Dysphagia, unspecified: Secondary | ICD-10-CM | POA: Diagnosis not present

## 2016-07-27 DIAGNOSIS — R1310 Dysphagia, unspecified: Secondary | ICD-10-CM | POA: Diagnosis not present

## 2016-07-27 DIAGNOSIS — G9340 Encephalopathy, unspecified: Secondary | ICD-10-CM | POA: Diagnosis not present

## 2016-07-27 DIAGNOSIS — Z96642 Presence of left artificial hip joint: Secondary | ICD-10-CM | POA: Diagnosis not present

## 2016-07-27 DIAGNOSIS — Z9889 Other specified postprocedural states: Secondary | ICD-10-CM | POA: Diagnosis not present

## 2016-07-27 DIAGNOSIS — M6281 Muscle weakness (generalized): Secondary | ICD-10-CM | POA: Diagnosis not present

## 2016-07-29 DIAGNOSIS — Z9889 Other specified postprocedural states: Secondary | ICD-10-CM | POA: Diagnosis not present

## 2016-07-29 DIAGNOSIS — G9340 Encephalopathy, unspecified: Secondary | ICD-10-CM | POA: Diagnosis not present

## 2016-07-29 DIAGNOSIS — M6281 Muscle weakness (generalized): Secondary | ICD-10-CM | POA: Diagnosis not present

## 2016-07-29 DIAGNOSIS — R1310 Dysphagia, unspecified: Secondary | ICD-10-CM | POA: Diagnosis not present

## 2016-07-29 DIAGNOSIS — Z96642 Presence of left artificial hip joint: Secondary | ICD-10-CM | POA: Diagnosis not present

## 2016-07-30 DIAGNOSIS — Z9889 Other specified postprocedural states: Secondary | ICD-10-CM | POA: Diagnosis not present

## 2016-07-30 DIAGNOSIS — R1310 Dysphagia, unspecified: Secondary | ICD-10-CM | POA: Diagnosis not present

## 2016-07-30 DIAGNOSIS — Z96642 Presence of left artificial hip joint: Secondary | ICD-10-CM | POA: Diagnosis not present

## 2016-07-30 DIAGNOSIS — G9340 Encephalopathy, unspecified: Secondary | ICD-10-CM | POA: Diagnosis not present

## 2016-07-30 DIAGNOSIS — M6281 Muscle weakness (generalized): Secondary | ICD-10-CM | POA: Diagnosis not present

## 2016-07-31 DIAGNOSIS — Z96642 Presence of left artificial hip joint: Secondary | ICD-10-CM | POA: Diagnosis not present

## 2016-07-31 DIAGNOSIS — Z9889 Other specified postprocedural states: Secondary | ICD-10-CM | POA: Diagnosis not present

## 2016-07-31 DIAGNOSIS — G9340 Encephalopathy, unspecified: Secondary | ICD-10-CM | POA: Diagnosis not present

## 2016-07-31 DIAGNOSIS — M6281 Muscle weakness (generalized): Secondary | ICD-10-CM | POA: Diagnosis not present

## 2016-07-31 DIAGNOSIS — R1310 Dysphagia, unspecified: Secondary | ICD-10-CM | POA: Diagnosis not present

## 2016-08-01 DIAGNOSIS — Z9889 Other specified postprocedural states: Secondary | ICD-10-CM | POA: Diagnosis not present

## 2016-08-01 DIAGNOSIS — M6281 Muscle weakness (generalized): Secondary | ICD-10-CM | POA: Diagnosis not present

## 2016-08-01 DIAGNOSIS — Z96642 Presence of left artificial hip joint: Secondary | ICD-10-CM | POA: Diagnosis not present

## 2016-08-01 DIAGNOSIS — R1310 Dysphagia, unspecified: Secondary | ICD-10-CM | POA: Diagnosis not present

## 2016-08-01 DIAGNOSIS — G9340 Encephalopathy, unspecified: Secondary | ICD-10-CM | POA: Diagnosis not present

## 2016-08-02 DIAGNOSIS — M6281 Muscle weakness (generalized): Secondary | ICD-10-CM | POA: Diagnosis not present

## 2016-08-02 DIAGNOSIS — R1310 Dysphagia, unspecified: Secondary | ICD-10-CM | POA: Diagnosis not present

## 2016-08-02 DIAGNOSIS — Z96642 Presence of left artificial hip joint: Secondary | ICD-10-CM | POA: Diagnosis not present

## 2016-08-02 DIAGNOSIS — Z9889 Other specified postprocedural states: Secondary | ICD-10-CM | POA: Diagnosis not present

## 2016-08-02 DIAGNOSIS — G9340 Encephalopathy, unspecified: Secondary | ICD-10-CM | POA: Diagnosis not present

## 2016-08-03 DIAGNOSIS — G9340 Encephalopathy, unspecified: Secondary | ICD-10-CM | POA: Diagnosis not present

## 2016-08-03 DIAGNOSIS — M6281 Muscle weakness (generalized): Secondary | ICD-10-CM | POA: Diagnosis not present

## 2016-08-03 DIAGNOSIS — Z9889 Other specified postprocedural states: Secondary | ICD-10-CM | POA: Diagnosis not present

## 2016-08-03 DIAGNOSIS — Z96642 Presence of left artificial hip joint: Secondary | ICD-10-CM | POA: Diagnosis not present

## 2016-08-03 DIAGNOSIS — R1310 Dysphagia, unspecified: Secondary | ICD-10-CM | POA: Diagnosis not present

## 2016-08-04 DIAGNOSIS — M6281 Muscle weakness (generalized): Secondary | ICD-10-CM | POA: Diagnosis not present

## 2016-08-04 DIAGNOSIS — Z96642 Presence of left artificial hip joint: Secondary | ICD-10-CM | POA: Diagnosis not present

## 2016-08-04 DIAGNOSIS — R1310 Dysphagia, unspecified: Secondary | ICD-10-CM | POA: Diagnosis not present

## 2016-08-04 DIAGNOSIS — G9340 Encephalopathy, unspecified: Secondary | ICD-10-CM | POA: Diagnosis not present

## 2016-08-04 DIAGNOSIS — Z9889 Other specified postprocedural states: Secondary | ICD-10-CM | POA: Diagnosis not present

## 2016-08-05 DIAGNOSIS — Z96642 Presence of left artificial hip joint: Secondary | ICD-10-CM | POA: Diagnosis not present

## 2016-08-05 DIAGNOSIS — R1310 Dysphagia, unspecified: Secondary | ICD-10-CM | POA: Diagnosis not present

## 2016-08-05 DIAGNOSIS — G9340 Encephalopathy, unspecified: Secondary | ICD-10-CM | POA: Diagnosis not present

## 2016-08-05 DIAGNOSIS — Z9889 Other specified postprocedural states: Secondary | ICD-10-CM | POA: Diagnosis not present

## 2016-08-05 DIAGNOSIS — M6281 Muscle weakness (generalized): Secondary | ICD-10-CM | POA: Diagnosis not present

## 2016-08-06 DIAGNOSIS — Z96642 Presence of left artificial hip joint: Secondary | ICD-10-CM | POA: Diagnosis not present

## 2016-08-06 DIAGNOSIS — Z9889 Other specified postprocedural states: Secondary | ICD-10-CM | POA: Diagnosis not present

## 2016-08-06 DIAGNOSIS — M6289 Other specified disorders of muscle: Secondary | ICD-10-CM | POA: Diagnosis not present

## 2016-08-06 DIAGNOSIS — Z4732 Aftercare following explantation of hip joint prosthesis: Secondary | ICD-10-CM | POA: Diagnosis not present

## 2016-08-06 DIAGNOSIS — M6281 Muscle weakness (generalized): Secondary | ICD-10-CM | POA: Diagnosis not present

## 2016-08-06 DIAGNOSIS — R1310 Dysphagia, unspecified: Secondary | ICD-10-CM | POA: Diagnosis not present

## 2016-08-06 DIAGNOSIS — G9340 Encephalopathy, unspecified: Secondary | ICD-10-CM | POA: Diagnosis not present

## 2016-08-07 DIAGNOSIS — R1310 Dysphagia, unspecified: Secondary | ICD-10-CM | POA: Diagnosis not present

## 2016-08-07 DIAGNOSIS — Z96642 Presence of left artificial hip joint: Secondary | ICD-10-CM | POA: Diagnosis not present

## 2016-08-07 DIAGNOSIS — M6281 Muscle weakness (generalized): Secondary | ICD-10-CM | POA: Diagnosis not present

## 2016-08-07 DIAGNOSIS — Z9889 Other specified postprocedural states: Secondary | ICD-10-CM | POA: Diagnosis not present

## 2016-08-07 DIAGNOSIS — G9340 Encephalopathy, unspecified: Secondary | ICD-10-CM | POA: Diagnosis not present

## 2016-08-08 DIAGNOSIS — Z96642 Presence of left artificial hip joint: Secondary | ICD-10-CM | POA: Diagnosis not present

## 2016-08-08 DIAGNOSIS — M6281 Muscle weakness (generalized): Secondary | ICD-10-CM | POA: Diagnosis not present

## 2016-08-08 DIAGNOSIS — Z9889 Other specified postprocedural states: Secondary | ICD-10-CM | POA: Diagnosis not present

## 2016-08-08 DIAGNOSIS — R1310 Dysphagia, unspecified: Secondary | ICD-10-CM | POA: Diagnosis not present

## 2016-08-08 DIAGNOSIS — G9340 Encephalopathy, unspecified: Secondary | ICD-10-CM | POA: Diagnosis not present

## 2016-08-09 DIAGNOSIS — R1310 Dysphagia, unspecified: Secondary | ICD-10-CM | POA: Diagnosis not present

## 2016-08-09 DIAGNOSIS — G9340 Encephalopathy, unspecified: Secondary | ICD-10-CM | POA: Diagnosis not present

## 2016-08-09 DIAGNOSIS — M6281 Muscle weakness (generalized): Secondary | ICD-10-CM | POA: Diagnosis not present

## 2016-08-09 DIAGNOSIS — Z96642 Presence of left artificial hip joint: Secondary | ICD-10-CM | POA: Diagnosis not present

## 2016-08-09 DIAGNOSIS — Z9889 Other specified postprocedural states: Secondary | ICD-10-CM | POA: Diagnosis not present

## 2016-08-10 DIAGNOSIS — Z96642 Presence of left artificial hip joint: Secondary | ICD-10-CM | POA: Diagnosis not present

## 2016-08-10 DIAGNOSIS — Z9889 Other specified postprocedural states: Secondary | ICD-10-CM | POA: Diagnosis not present

## 2016-08-10 DIAGNOSIS — G9340 Encephalopathy, unspecified: Secondary | ICD-10-CM | POA: Diagnosis not present

## 2016-08-10 DIAGNOSIS — M6281 Muscle weakness (generalized): Secondary | ICD-10-CM | POA: Diagnosis not present

## 2016-08-10 DIAGNOSIS — R1310 Dysphagia, unspecified: Secondary | ICD-10-CM | POA: Diagnosis not present

## 2016-08-12 DIAGNOSIS — Z96642 Presence of left artificial hip joint: Secondary | ICD-10-CM | POA: Diagnosis not present

## 2016-08-12 DIAGNOSIS — G9340 Encephalopathy, unspecified: Secondary | ICD-10-CM | POA: Diagnosis not present

## 2016-08-12 DIAGNOSIS — M6281 Muscle weakness (generalized): Secondary | ICD-10-CM | POA: Diagnosis not present

## 2016-08-12 DIAGNOSIS — Z9889 Other specified postprocedural states: Secondary | ICD-10-CM | POA: Diagnosis not present

## 2016-08-12 DIAGNOSIS — R1310 Dysphagia, unspecified: Secondary | ICD-10-CM | POA: Diagnosis not present

## 2016-08-13 DIAGNOSIS — R1310 Dysphagia, unspecified: Secondary | ICD-10-CM | POA: Diagnosis not present

## 2016-08-13 DIAGNOSIS — G9340 Encephalopathy, unspecified: Secondary | ICD-10-CM | POA: Diagnosis not present

## 2016-08-13 DIAGNOSIS — Z96642 Presence of left artificial hip joint: Secondary | ICD-10-CM | POA: Diagnosis not present

## 2016-08-13 DIAGNOSIS — M6281 Muscle weakness (generalized): Secondary | ICD-10-CM | POA: Diagnosis not present

## 2016-08-13 DIAGNOSIS — Z9889 Other specified postprocedural states: Secondary | ICD-10-CM | POA: Diagnosis not present

## 2016-08-14 DIAGNOSIS — Z9889 Other specified postprocedural states: Secondary | ICD-10-CM | POA: Diagnosis not present

## 2016-08-14 DIAGNOSIS — R1310 Dysphagia, unspecified: Secondary | ICD-10-CM | POA: Diagnosis not present

## 2016-08-14 DIAGNOSIS — M6281 Muscle weakness (generalized): Secondary | ICD-10-CM | POA: Diagnosis not present

## 2016-08-14 DIAGNOSIS — Z96642 Presence of left artificial hip joint: Secondary | ICD-10-CM | POA: Diagnosis not present

## 2016-08-14 DIAGNOSIS — G9340 Encephalopathy, unspecified: Secondary | ICD-10-CM | POA: Diagnosis not present

## 2016-08-15 DIAGNOSIS — R1310 Dysphagia, unspecified: Secondary | ICD-10-CM | POA: Diagnosis not present

## 2016-08-15 DIAGNOSIS — Z96642 Presence of left artificial hip joint: Secondary | ICD-10-CM | POA: Diagnosis not present

## 2016-08-15 DIAGNOSIS — G9340 Encephalopathy, unspecified: Secondary | ICD-10-CM | POA: Diagnosis not present

## 2016-08-15 DIAGNOSIS — Z9889 Other specified postprocedural states: Secondary | ICD-10-CM | POA: Diagnosis not present

## 2016-08-15 DIAGNOSIS — M6281 Muscle weakness (generalized): Secondary | ICD-10-CM | POA: Diagnosis not present

## 2016-08-16 DIAGNOSIS — G9340 Encephalopathy, unspecified: Secondary | ICD-10-CM | POA: Diagnosis not present

## 2016-08-16 DIAGNOSIS — M6281 Muscle weakness (generalized): Secondary | ICD-10-CM | POA: Diagnosis not present

## 2016-08-16 DIAGNOSIS — Z9889 Other specified postprocedural states: Secondary | ICD-10-CM | POA: Diagnosis not present

## 2016-08-16 DIAGNOSIS — Z96642 Presence of left artificial hip joint: Secondary | ICD-10-CM | POA: Diagnosis not present

## 2016-08-16 DIAGNOSIS — R1310 Dysphagia, unspecified: Secondary | ICD-10-CM | POA: Diagnosis not present

## 2016-08-20 ENCOUNTER — Inpatient Hospital Stay (HOSPITAL_COMMUNITY)
Admission: EM | Admit: 2016-08-20 | Discharge: 2016-08-21 | DRG: 315 | Disposition: A | Discharge status: Skilled Nursing Facility | Payer: Medicare Other | Attending: Internal Medicine | Admitting: Internal Medicine

## 2016-08-20 ENCOUNTER — Inpatient Hospital Stay (HOSPITAL_COMMUNITY): Payer: Medicare Other

## 2016-08-20 ENCOUNTER — Emergency Department (HOSPITAL_COMMUNITY): Payer: Medicare Other

## 2016-08-20 ENCOUNTER — Encounter (HOSPITAL_COMMUNITY): Payer: Self-pay

## 2016-08-20 DIAGNOSIS — Z66 Do not resuscitate: Secondary | ICD-10-CM | POA: Diagnosis present

## 2016-08-20 DIAGNOSIS — N319 Neuromuscular dysfunction of bladder, unspecified: Secondary | ICD-10-CM | POA: Diagnosis present

## 2016-08-20 DIAGNOSIS — Z79899 Other long term (current) drug therapy: Secondary | ICD-10-CM

## 2016-08-20 DIAGNOSIS — N3281 Overactive bladder: Secondary | ICD-10-CM | POA: Diagnosis present

## 2016-08-20 DIAGNOSIS — Z880 Allergy status to penicillin: Secondary | ICD-10-CM

## 2016-08-20 DIAGNOSIS — Z87820 Personal history of traumatic brain injury: Secondary | ICD-10-CM

## 2016-08-20 DIAGNOSIS — R1030 Lower abdominal pain, unspecified: Secondary | ICD-10-CM | POA: Diagnosis not present

## 2016-08-20 DIAGNOSIS — R1084 Generalized abdominal pain: Secondary | ICD-10-CM | POA: Diagnosis not present

## 2016-08-20 DIAGNOSIS — F79 Unspecified intellectual disabilities: Secondary | ICD-10-CM | POA: Diagnosis present

## 2016-08-20 DIAGNOSIS — H53489 Generalized contraction of visual field, unspecified eye: Secondary | ICD-10-CM | POA: Diagnosis present

## 2016-08-20 DIAGNOSIS — K219 Gastro-esophageal reflux disease without esophagitis: Secondary | ICD-10-CM | POA: Diagnosis present

## 2016-08-20 DIAGNOSIS — R109 Unspecified abdominal pain: Secondary | ICD-10-CM | POA: Diagnosis present

## 2016-08-20 DIAGNOSIS — Z7951 Long term (current) use of inhaled steroids: Secondary | ICD-10-CM | POA: Diagnosis not present

## 2016-08-20 DIAGNOSIS — D509 Iron deficiency anemia, unspecified: Secondary | ICD-10-CM | POA: Diagnosis present

## 2016-08-20 DIAGNOSIS — F419 Anxiety disorder, unspecified: Secondary | ICD-10-CM | POA: Diagnosis present

## 2016-08-20 DIAGNOSIS — E559 Vitamin D deficiency, unspecified: Secondary | ICD-10-CM | POA: Diagnosis present

## 2016-08-20 DIAGNOSIS — R05 Cough: Secondary | ICD-10-CM | POA: Diagnosis not present

## 2016-08-20 DIAGNOSIS — R110 Nausea: Secondary | ICD-10-CM | POA: Diagnosis present

## 2016-08-20 DIAGNOSIS — Z9049 Acquired absence of other specified parts of digestive tract: Secondary | ICD-10-CM

## 2016-08-20 DIAGNOSIS — Z79891 Long term (current) use of opiate analgesic: Secondary | ICD-10-CM

## 2016-08-20 DIAGNOSIS — Z8249 Family history of ischemic heart disease and other diseases of the circulatory system: Secondary | ICD-10-CM

## 2016-08-20 DIAGNOSIS — Z7982 Long term (current) use of aspirin: Secondary | ICD-10-CM

## 2016-08-20 DIAGNOSIS — H9190 Unspecified hearing loss, unspecified ear: Secondary | ICD-10-CM | POA: Diagnosis present

## 2016-08-20 DIAGNOSIS — F329 Major depressive disorder, single episode, unspecified: Secondary | ICD-10-CM | POA: Diagnosis present

## 2016-08-20 DIAGNOSIS — N179 Acute kidney failure, unspecified: Secondary | ICD-10-CM | POA: Diagnosis present

## 2016-08-20 DIAGNOSIS — E860 Dehydration: Secondary | ICD-10-CM

## 2016-08-20 DIAGNOSIS — Z885 Allergy status to narcotic agent status: Secondary | ICD-10-CM

## 2016-08-20 DIAGNOSIS — N401 Enlarged prostate with lower urinary tract symptoms: Secondary | ICD-10-CM | POA: Diagnosis present

## 2016-08-20 DIAGNOSIS — J449 Chronic obstructive pulmonary disease, unspecified: Secondary | ICD-10-CM | POA: Diagnosis present

## 2016-08-20 DIAGNOSIS — R1900 Intra-abdominal and pelvic swelling, mass and lump, unspecified site: Secondary | ICD-10-CM | POA: Diagnosis present

## 2016-08-20 DIAGNOSIS — R338 Other retention of urine: Secondary | ICD-10-CM | POA: Diagnosis present

## 2016-08-20 DIAGNOSIS — E039 Hypothyroidism, unspecified: Secondary | ICD-10-CM | POA: Diagnosis present

## 2016-08-20 DIAGNOSIS — E785 Hyperlipidemia, unspecified: Secondary | ICD-10-CM | POA: Diagnosis present

## 2016-08-20 DIAGNOSIS — Z7401 Bed confinement status: Secondary | ICD-10-CM

## 2016-08-20 DIAGNOSIS — Z96643 Presence of artificial hip joint, bilateral: Secondary | ICD-10-CM | POA: Diagnosis present

## 2016-08-20 DIAGNOSIS — K2970 Gastritis, unspecified, without bleeding: Secondary | ICD-10-CM | POA: Diagnosis not present

## 2016-08-20 DIAGNOSIS — Z881 Allergy status to other antibiotic agents status: Secondary | ICD-10-CM

## 2016-08-20 DIAGNOSIS — I959 Hypotension, unspecified: Principal | ICD-10-CM | POA: Diagnosis present

## 2016-08-20 DIAGNOSIS — R32 Unspecified urinary incontinence: Secondary | ICD-10-CM | POA: Diagnosis present

## 2016-08-20 DIAGNOSIS — R279 Unspecified lack of coordination: Secondary | ICD-10-CM | POA: Diagnosis not present

## 2016-08-20 DIAGNOSIS — R112 Nausea with vomiting, unspecified: Secondary | ICD-10-CM | POA: Diagnosis not present

## 2016-08-20 DIAGNOSIS — Z961 Presence of intraocular lens: Secondary | ICD-10-CM | POA: Diagnosis not present

## 2016-08-20 DIAGNOSIS — I950 Idiopathic hypotension: Secondary | ICD-10-CM | POA: Diagnosis not present

## 2016-08-20 DIAGNOSIS — R10817 Generalized abdominal tenderness: Secondary | ICD-10-CM | POA: Diagnosis not present

## 2016-08-20 HISTORY — DX: Dysphagia, unspecified: R13.10

## 2016-08-20 HISTORY — DX: Vitamin D deficiency, unspecified: E55.9

## 2016-08-20 HISTORY — DX: Retention of urine, unspecified: R33.9

## 2016-08-20 HISTORY — DX: Hypokalemia: E87.6

## 2016-08-20 HISTORY — DX: Chronic obstructive pulmonary disease, unspecified: J44.9

## 2016-08-20 HISTORY — DX: Allergic rhinitis, unspecified: J30.9

## 2016-08-20 HISTORY — DX: Presence of left artificial hip joint: Z96.642

## 2016-08-20 HISTORY — DX: Benign prostatic hyperplasia without lower urinary tract symptoms: N40.0

## 2016-08-20 HISTORY — DX: Neuromuscular dysfunction of bladder, unspecified: N31.9

## 2016-08-20 HISTORY — DX: Encephalopathy, unspecified: G93.40

## 2016-08-20 HISTORY — DX: Hyperlipidemia, unspecified: E78.5

## 2016-08-20 HISTORY — DX: Unspecified protein-calorie malnutrition: E46

## 2016-08-20 HISTORY — DX: Constipation, unspecified: K59.00

## 2016-08-20 HISTORY — DX: Gastro-esophageal reflux disease without esophagitis: K21.9

## 2016-08-20 HISTORY — DX: Iron deficiency anemia, unspecified: D50.9

## 2016-08-20 LAB — COMPREHENSIVE METABOLIC PANEL
ALBUMIN: 4.2 g/dL (ref 3.5–5.0)
ALK PHOS: 91 U/L (ref 38–126)
ALT: 17 U/L (ref 17–63)
ANION GAP: 10 (ref 5–15)
AST: 34 U/L (ref 15–41)
BUN: 17 mg/dL (ref 6–20)
CALCIUM: 9.4 mg/dL (ref 8.9–10.3)
CO2: 25 mmol/L (ref 22–32)
Chloride: 98 mmol/L — ABNORMAL LOW (ref 101–111)
Creatinine, Ser: 0.74 mg/dL (ref 0.61–1.24)
GFR calc Af Amer: >60 mL/min (ref >60)
GLUCOSE: 98 mg/dL (ref 65–99)
Potassium: 4.9 mmol/L (ref 3.5–5.1)
Sodium: 133 mmol/L — ABNORMAL LOW (ref 135–145)
TOTAL PROTEIN: 8.1 g/dL (ref 6.5–8.1)
Total Bilirubin: 0.7 mg/dL (ref 0.3–1.2)

## 2016-08-20 LAB — CBC WITH DIFFERENTIAL/PLATELET
Basophils Absolute: 0 10*3/uL (ref 0.0–0.1)
Basophils Relative: 0 %
EOS PCT: 2 %
Eosinophils Absolute: 0.2 10*3/uL (ref 0.0–0.7)
HEMATOCRIT: 41.1 % (ref 39.0–52.0)
Hemoglobin: 13.4 g/dL (ref 13.0–17.0)
LYMPHS PCT: 18 %
Lymphs Abs: 1.5 10*3/uL (ref 0.7–4.0)
MCH: 29.3 pg (ref 26.0–34.0)
MCHC: 32.6 g/dL (ref 30.0–36.0)
MCV: 89.7 fL (ref 78.0–100.0)
MONO ABS: 0.4 10*3/uL (ref 0.1–1.0)
MONOS PCT: 5 %
NEUTROS ABS: 6 10*3/uL (ref 1.7–7.7)
Neutrophils Relative %: 75 %
PLATELETS: 220 10*3/uL (ref 150–400)
RBC: 4.58 MIL/uL (ref 4.22–5.81)
RDW: 16.5 % — AB (ref 11.5–15.5)
WBC: 8 10*3/uL (ref 4.0–10.5)

## 2016-08-20 LAB — LIPASE, BLOOD: Lipase: 38 U/L (ref 11–51)

## 2016-08-20 LAB — LACTIC ACID, PLASMA
Lactic Acid, Venous: 1.4 mmol/L (ref 0.5–1.9)
Lactic Acid, Venous: 0.9 mmol/L (ref 0.5–1.9)

## 2016-08-20 LAB — TROPONIN I: Troponin I: <0.03 ng/mL (ref <0.03)

## 2016-08-20 LAB — MAGNESIUM: Magnesium: 2.3 mg/dL (ref 1.7–2.4)

## 2016-08-20 MED ORDER — SODIUM CHLORIDE 0.9 % IV SOLN
INTRAVENOUS | Status: DC
  Administered 2016-08-20: 21:00:00 via INTRAVENOUS

## 2016-08-20 MED ORDER — TAMSULOSIN HCL 0.4 MG PO CAPS
0.4000 mg | ORAL_CAPSULE | Freq: Every day | ORAL | Status: DC
  Administered 2016-08-21: 0.4 mg via ORAL
  Filled 2016-08-20: qty 1

## 2016-08-20 MED ORDER — FINASTERIDE 5 MG PO TABS
5.0000 mg | ORAL_TABLET | Freq: Every day | ORAL | Status: DC
  Administered 2016-08-21: 5 mg via ORAL
  Filled 2016-08-20 (×2): qty 1

## 2016-08-20 MED ORDER — SENNOSIDES-DOCUSATE SODIUM 8.6-50 MG PO TABS
1.0000 | ORAL_TABLET | Freq: Two times a day (BID) | ORAL | Status: DC
  Administered 2016-08-21: 1 via ORAL
  Filled 2016-08-20: qty 1

## 2016-08-20 MED ORDER — SODIUM CHLORIDE 0.9 % IV SOLN
INTRAVENOUS | Status: DC
  Administered 2016-08-21 (×2): via INTRAVENOUS

## 2016-08-20 MED ORDER — ASPIRIN EC 81 MG PO TBEC
81.0000 mg | DELAYED_RELEASE_TABLET | Freq: Every day | ORAL | Status: DC
  Administered 2016-08-21: 81 mg via ORAL
  Filled 2016-08-20: qty 1

## 2016-08-20 MED ORDER — ONDANSETRON HCL 4 MG PO TABS
4.0000 mg | ORAL_TABLET | Freq: Four times a day (QID) | ORAL | Status: DC | PRN
Start: 2016-08-20 — End: 2016-08-21

## 2016-08-20 MED ORDER — HYPROMELLOSE (GONIOSCOPIC) 2.5 % OP SOLN
1.0000 [drp] | Freq: Four times a day (QID) | OPHTHALMIC | Status: DC | PRN
  Filled 2016-08-20: qty 15

## 2016-08-20 MED ORDER — HYDROCODONE-ACETAMINOPHEN 5-325 MG PO TABS
1.0000 | ORAL_TABLET | Freq: Two times a day (BID) | ORAL | Status: DC
  Administered 2016-08-21: 1 via ORAL
  Filled 2016-08-20: qty 1

## 2016-08-20 MED ORDER — ATORVASTATIN CALCIUM 10 MG PO TABS: 10.0000 mg | ORAL_TABLET | Freq: Every day | ORAL | Status: DC

## 2016-08-20 MED ORDER — LEVOTHYROXINE SODIUM 75 MCG PO TABS: 150.0000 ug | ORAL_TABLET | Freq: Every day | ORAL | Status: DC

## 2016-08-20 MED ORDER — POLYETHYLENE GLYCOL 3350 17 G PO PACK
17.0000 g | PACK | Freq: Every day | ORAL | Status: DC
  Administered 2016-08-21: 17 g via ORAL
  Filled 2016-08-20: qty 1

## 2016-08-20 MED ORDER — METHOCARBAMOL 500 MG PO TABS: 500.0000 mg | ORAL_TABLET | Freq: Four times a day (QID) | ORAL | Status: DC | PRN

## 2016-08-20 MED ORDER — ENSURE ENLIVE PO LIQD
237.0000 mL | Freq: Two times a day (BID) | ORAL | Status: DC
  Administered 2016-08-21: 237 mL via ORAL

## 2016-08-20 MED ORDER — SODIUM CHLORIDE 0.9 % IV BOLUS (SEPSIS)
500.0000 mL | Freq: Once | INTRAVENOUS | Status: AC
  Administered 2016-08-20: 500 mL via INTRAVENOUS

## 2016-08-20 MED ORDER — LORATADINE 10 MG PO TABS
10.0000 mg | ORAL_TABLET | Freq: Every day | ORAL | Status: DC
  Administered 2016-08-21: 10 mg via ORAL
  Filled 2016-08-20: qty 1

## 2016-08-20 MED ORDER — ONDANSETRON HCL 4 MG/2ML IJ SOLN: 4.0000 mg | Freq: Four times a day (QID) | INTRAMUSCULAR | Status: DC | PRN

## 2016-08-20 MED ORDER — ACETAMINOPHEN 650 MG RE SUPP: 650.0000 mg | Freq: Four times a day (QID) | RECTAL | Status: DC | PRN

## 2016-08-20 MED ORDER — ENOXAPARIN SODIUM 40 MG/0.4ML ~~LOC~~ SOLN
40.0000 mg | SUBCUTANEOUS | Status: DC
  Administered 2016-08-21: 40 mg via SUBCUTANEOUS
  Filled 2016-08-20: qty 0.4

## 2016-08-20 MED ORDER — TRAZODONE HCL 50 MG PO TABS: 12.5000 mg | ORAL_TABLET | Freq: Every day | ORAL | Status: DC

## 2016-08-20 MED ORDER — ACETAMINOPHEN 325 MG PO TABS: 650.0000 mg | ORAL_TABLET | Freq: Four times a day (QID) | ORAL | Status: DC | PRN

## 2016-08-20 MED ORDER — BACLOFEN 10 MG PO TABS: 5.0000 mg | ORAL_TABLET | Freq: Three times a day (TID) | ORAL | Status: DC | PRN

## 2016-08-20 MED ORDER — PANTOPRAZOLE SODIUM 40 MG PO TBEC
40.0000 mg | DELAYED_RELEASE_TABLET | Freq: Every day | ORAL | Status: DC
  Administered 2016-08-21: 40 mg via ORAL
  Filled 2016-08-20: qty 1

## 2016-08-20 MED ORDER — NIACIN ER (ANTIHYPERLIPIDEMIC) 500 MG PO TBCR
500.0000 mg | EXTENDED_RELEASE_TABLET | Freq: Every day | ORAL | Status: DC
  Filled 2016-08-20 (×2): qty 1

## 2016-08-20 NOTE — H&P (Signed)
History and Physical    Alexander Hanna KGU:[id number] DOB: [birth year] DOA: 08/20/2016  PCP: Edwinna Areola doctor at [facility]:  None Patient coming from: [facility], has lived there 2+ years; NOK: brother, [phone]  Chief Complaint: hypotension  HPI: Alexander Hanna is a 76 y.o. male with medical history significant of urinary retention, hearing loss, tunnel vision, intellectual disability, hypothyroidism, HLD, COPD, BPH, and left hip arthroplasty with subsequent removal of hardware resulting in bedbound state presenting from SNF with hypotension.  Brother sees him QOD - Wednesday he was nauseated but not vomiting.  Sunday, he was so nauseated he wanted to make himself vomit.  Today, about 3pm, his brother came in; the speech therapist told him that he very nauseated.  The nurse helped him to bed, BP 86/57, O2 was 78%.  Recheck BP 100/ and he was not as alert as usual.  They called the house doctor and decided to send him here.  Had a hip MRI recently.  Has been on medications, finished Bactrim this week for wound infection. With his nausea, he has not been eating/drinking. His brief was dry today when they tried to change him.  3 bags IVF and no urine yet.   Brother has worked as a Psychologist, occupational in cardiac rehab x 17 years. Tunnel vision, very hard of hearing, illiterate, intellectual disability.  ED Course: BP below baseline, no UOP despite NS x 1L.  Review of Systems: Unable to perform  Ambulatory Status:  Bedbound  Past Medical History:  Diagnosis Date  . Allergic rhinitis   . Anterior dislocation of left hip ([place])   . Anxiety   . Benign prostatic hyperplasia   . Constipation   . COPD (chronic obstructive pulmonary disease) ([place])   . Depression   . Dysphagia   . Encephalopathy   . Environmental allergies   . GERD (gastroesophageal reflux disease)   . Headache    brain damaged at age 44   . Hyperlipidemia   . Hypokalemia   . Hypothyroidism   . Incontinent of urine    wears a diaper per brother   . Iron deficiency anemia   . 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 Neuromuscular dysfunction of bladder   . Overactive bladder   . Polio    as a child  . Presence of artificial hip, left   . Protein calorie malnutrition ([place])   . Urinary retention   . Vitamin D deficiency     Past Surgical History:  Procedure Laterality Date  . CATARACT EXTRACTION W/PHACO  12/20/2011   Procedure: CATARACT EXTRACTION PHACO AND INTRAOCULAR LENS PLACEMENT (IOC);  Surgeon: Tonny Branch, MD;  Location: [facility];  Service: Ophthalmology;  Laterality: Left;  CDE: 14.80  . CATARACT EXTRACTION W/PHACO  01/14/2012   Procedure: CATARACT EXTRACTION PHACO AND INTRAOCULAR LENS PLACEMENT (IOC);  Surgeon: Tonny Branch, MD;  Location: [facility];  Service: Ophthalmology;  Laterality: Right;  CDE 19.57  . CHOLECYSTECTOMY    . ESOPHAGOGASTRODUODENOSCOPY N/A 01/14/2014   Dr. Rourk:non-critical schatzki ring, not manipulated/small HHotherwise normal. 20 F PEG tube placed   . EXCISIONAL TOTAL HIP ARTHROPLASTY WITH ANTIBIOTIC SPACERS Left 04/02/2016   Procedure: Resection left hip hemiarthroplasty- [place];  Surgeon: Paralee Cancel, MD;  Location: [facility];  Service: Orthopedics;  Laterality: Left;  . JOINT REPLACEMENT     rt anterior hip  . JOINT REPLACEMENT     Left hip  . PEG PLACEMENT N/A 01/14/2014   Procedure: PERCUTANEOUS  ENDOSCOPIC GASTROSTOMY (PEG) PLACEMENT;  Surgeon: Daneil Dolin, MD;  Location: [facility];  Service: Endoscopy;  Laterality: N/A;  . PERCUTANEOUS ENDOSCOPIC GASTROSTOMY (PEG) REMOVAL N/A 03/10/2015   Procedure: PERCUTANEOUS ENDOSCOPIC GASTROSTOMY (PEG) REMOVAL;  Surgeon: Daneil Dolin, MD;  Location: [facility];  Service: Endoscopy;  Laterality: N/A;  1115  . WOUND DEBRIDEMENT Left    Post hip replacement    Social History   Social History  . Marital status: Single    Spouse name: N/A  . Number of children: N/A  . Years of education: N/A    Occupational History  . Disabled    Social History Main Topics  . Smoking status: Never Smoker  . Smokeless tobacco: Never Used     Comment: Never smoked much  . Alcohol use No  . Drug use: No  . Sexual activity: Not Currently   Other Topics Concern  . Not on file   Social History Narrative  . No narrative on file    Allergies  Allergen Reactions  . Amoxicillin Other (See Comments)    Reaction unknown-provided via [place]. Pt's brother states he is not allergic to amoxicillin  . Avelox [Moxifloxacin Hcl In Nacl]   . Hydrocodone Other (See Comments)    Reaction unknown-provided via [place]  . Moxifloxacin Other (See Comments)    Reaction unknown-provided via Nursing Facility    Family History  Problem Relation Age of Onset  . Lung cancer Mother   . Heart attack Father   . Angina Brother   . Coronary artery disease Brother     Prior to Admission medications   Medication Sig Start Date End Date Taking? Authorizing Provider  acetaminophen (TYLENOL) 325 MG tablet Take 1,300 mg by mouth every morning.   Yes [provider]  acidophilus (RISAQUAD) CAPS capsule Take 1 capsule by mouth 2 (two) times daily.   Yes [provider]  Amino Acids-Protein Hydrolys (FEEDING SUPPLEMENT, PRO-STAT SUGAR FREE 64,) LIQD Take 30 mLs by mouth 2 (two) times daily.   Yes [provider]  ascorbic acid (VITAMIN C) 500 MG tablet Take 500 mg by mouth daily.   Yes [provider]  aspirin EC 81 MG tablet Take 81 mg by mouth daily.   Yes [provider]  atorvastatin (LIPITOR) 10 MG tablet Take 10 mg by mouth daily at 6 PM.  01/24/15  Yes [provider]  baclofen (LIORESAL) 10 MG tablet Take 5 mg by mouth every 8 (eight) hours as needed for muscle spasms.   Yes [provider]  cetirizine (ZYRTEC) 10 MG tablet Take 10 mg by mouth daily.   Yes [provider]  cholecalciferol (VITAMIN D) 1000 units tablet  Take 1,000 Units by mouth daily.   Yes [provider]  ezetimibe (ZETIA) 10 MG tablet Take 10 mg by mouth daily.   Yes [provider]  ferrous sulfate 325 (65 FE) MG tablet Take 325 mg by mouth 2 (two) times daily.    Yes [provider]  finasteride (PROSCAR) 5 MG tablet Take 5 mg by mouth daily.  02/10/15  Yes [provider]  fluticasone (FLONASE) 50 MCG/ACT nasal spray Place 2 sprays into the nose 2 (two) times daily. Allergies   Yes [provider]  guaiFENesin (MUCINEX) 600 MG 12 hr tablet Take 600 mg by mouth 2 (two) times daily.    Yes [provider]  HYDROcodone-acetaminophen (NORCO/VICODIN) 5-325 MG tablet Take 1 tablet by mouth  2 (two) times daily.   Yes [provider]  hydroxypropyl methylcellulose / hypromellose (ISOPTO TEARS / GONIOVISC) 2.5 % ophthalmic solution Place 1 drop into both eyes 4 (four) times daily as needed for dry eyes.   Yes [provider]  levothyroxine (SYNTHROID, LEVOTHROID) 150 MCG tablet Take 150 mcg by mouth at bedtime.   Yes [provider]  methocarbamol (ROBAXIN) 500 MG tablet Take 1 tablet (500 mg total) by mouth every 6 (six) hours as needed for muscle spasms. 04/04/16  Yes Babish, Rodman Key, PA-C  Multiple Vitamin (MULTIVITAMIN) tablet Take 1 tablet by mouth daily.   Yes [provider]  niacin (NIASPAN) 500 MG CR tablet Take 500 mg by mouth at bedtime.  02/24/15  Yes [provider]  omeprazole (PRILOSEC) 20 MG capsule Take 20 mg by mouth daily.  02/12/15  Yes [provider]  polyethylene glycol (MIRALAX / GLYCOLAX) packet Take 17 g by mouth daily.   Yes [provider]  senna-docusate (SENNA-PLUS) 8.6-50 MG tablet Take 1 tablet by mouth 2 (two) times daily.   Yes [provider]  sulfamethoxazole-trimethoprim (BACTRIM DS,SEPTRA DS) 800-160 MG tablet Take 1 tablet by mouth 2 (two) times daily. 14 day course for hip infection starting  on 08/07/16   Yes [provider]  tamsulosin (FLOMAX) 0.4 MG CAPS capsule Take 0.4 mg by mouth daily.    Yes [provider]  traZODone (DESYREL) 50 MG tablet Take 12.5 mg by mouth at bedtime.   Yes [provider]    Physical Exam: Vitals:   08/20/16 2100 08/20/16 2100 08/20/16 2200 08/20/16 2230  BP: 109/65  104/64 99/61  Pulse: (!) 57   61  Resp: 19  14 13   Temp:  97.5 F (36.4 C)    TempSrc:  Oral    SpO2: 96%   96%  Weight:      Height:         General:  Appears calm and comfortable and is NAD Eyes: PERRL, EOMI, normal lids, iris ENT: extremely hard of hearing, normal lips & tongue, mmm Neck: no LAD, masses or thyromegaly Cardiovascular:  RRR, no m/r/g. No LE edema.  Respiratory:  CTA bilaterally, no w/r/r. Normal respiratory effort. Abdomen:  soft, +TTP - hard to define exactly where, nd, NABS Skin:  no rash or induration seen on limited exam Musculoskeletal:  LLE contracture Psychiatric: Unable to assess - talks minimally but appropriately when he does Neurologic:  Unable to assess  Labs on Admission: I have personally reviewed following labs and imaging studies  CBC:  Recent Labs Lab 08/20/16 1827  WBC 8.0  NEUTROABS 6.0  HGB 13.4  HCT 41.1  MCV 89.7  PLT 299   Basic Metabolic Panel:  Recent Labs Lab 08/20/16 1827 08/20/16 1925  NA 133*  --   K 4.9  --   CL 98*  --   CO2 25  --   GLUCOSE 98  --   BUN 17  --   CREATININE 0.74  --   CALCIUM 9.4  --   MG  --  2.3   GFR: Estimated Creatinine Clearance: 62.2 mL/min (by C-G formula based on SCr of 0.74 mg/dL). Liver Function Tests:  Recent Labs Lab 08/20/16 1827  AST 34  ALT 17  ALKPHOS 91  BILITOT 0.7  PROT 8.1  ALBUMIN 4.2    Recent Labs Lab 08/20/16 1827  LIPASE 38   No results for input(s): AMMONIA in the last 168 hours. Coagulation  Profile: No results for input(s): INR, PROTIME in the last 168 hours. Cardiac Enzymes:  Recent Labs Lab  08/20/16 1827  TROPONINI <0.03   BNP (last 3 results) No results for input(s): PROBNP in the last 8760 hours. HbA1C: No results for input(s): HGBA1C in the last 72 hours. CBG: No results for input(s): GLUCAP in the last 168 hours. Lipid Profile: No results for input(s): CHOL, HDL, LDLCALC, TRIG, CHOLHDL, LDLDIRECT in the last 72 hours. Thyroid Function Tests: No results for input(s): TSH, T4TOTAL, FREET4, T3FREE, THYROIDAB in the last 72 hours. Anemia Panel: No results for input(s): VITAMINB12, FOLATE, FERRITIN, TIBC, IRON, RETICCTPCT in the last 72 hours. Urine analysis:    Component Value Date/Time   COLORURINE YELLOW 06/17/2013 1720   APPEARANCEUR CLEAR 06/17/2013 1720   LABSPEC 1.010 06/17/2013 1720   PHURINE 7.0 06/17/2013 1720   GLUCOSEU NEGATIVE 06/17/2013 1720   HGBUR MODERATE (A) 06/17/2013 1720   BILIRUBINUR NEGATIVE 06/17/2013 1720   KETONESUR NEGATIVE 06/17/2013 1720   PROTEINUR NEGATIVE 06/17/2013 1720   UROBILINOGEN 0.2 06/17/2013 1720   NITRITE NEGATIVE 06/17/2013 1720   LEUKOCYTESUR NEGATIVE 06/17/2013 1720    Creatinine Clearance: Estimated Creatinine Clearance: 62.2 mL/min (by C-G formula based on SCr of 0.74 mg/dL).  Sepsis Labs: @LABRCNTIP (procalcitonin:4,lacticidven:4) )No results found for this or any previous visit (from the past 240 hour(s)).   Radiological Exams on Admission: Ct Abdomen Pelvis Wo Contrast  Result Date: 08/20/2016 CLINICAL DATA:  Abdominal pain.  Recent hip surgery. EXAM: CT ABDOMEN AND PELVIS WITHOUT CONTRAST TECHNIQUE: Multidetector CT imaging of the abdomen and pelvis was performed following the standard protocol without IV contrast. COMPARISON:  None. FINDINGS: Lower chest: Bibasilar hypoventilatory changes. Hepatobiliary: No focal liver abnormality is seen. Status post cholecystectomy. No biliary dilatation. Pancreas: Unremarkable. No pancreatic ductal dilatation or surrounding inflammatory changes. Spleen: Normal in size without  focal abnormality. Adrenals/Urinary Tract: There are bilateral nonobstructive renal calculi and bilateral renal cysts. The adrenal glands appear normal. There are circumscribed masses in bilateral retroperitoneum measuring water density. The largest of these in the right retroperitoneum measures 7.5 Cm. Stomach/Bowel: Mucosal thickening of the gastric body. No evidence of small-bowel obstruction. Large amount of formed stool within the rectum. Vascular/Lymphatic: Aortic atherosclerosis. Reproductive: Nonenlarged prostate. Other: None. Musculoskeletal: Post right hip arthroplasty. Postsurgical absence of the left femoral head. The left femur is superiorly dislocated. Fat stranding and muscle stranding surrounds the femoral neck. It is difficult to determine whether these the postsurgical or infectious changes. Severe compression deformity of L1 vertebral body IMPRESSION: Circumscribed water density bilateral retroperitoneal masses with the largest in the right retroperitoneum measuring 7.5 cm. These may represent exophytic renal cysts, or lymph angiomas. Necrotic retroperitoneal lymphadenopathy although on the differential diagnosis is considered less likely. Follow-up in 3-6 months may be considered. Mucosal thickening of the gastric body. Please correlate to upper endoscopy results. Post removal of left hip arthroplasty hardware with superior dislocation of the left femur. Soft tissue thickening surrounding the left femoral neck.It is difficult to determine whether this represents postsurgical or infectious changes. Severe compression deformity of L1 vertebral body with approximately 90% height loss anteriorly, progressed from 11/24/2012, with focal kyphotic deformity of the spine. Electronically Signed   By: Fidela Salisbury M.D.   On: 08/20/2016 23:13   Dg Chest Port 1 View  Result Date: 08/20/2016 CLINICAL DATA:  Productive cough with nausea EXAM: PORTABLE CHEST 1 VIEW COMPARISON:  07/07/2013 FINDINGS:  Mildly low lung volumes. No consolidation or effusion. Normal cardiomediastinal silhouette. No pneumothorax.  IMPRESSION: No active disease. Electronically Signed   By: Donavan Foil M.D.   On: 08/20/2016 18:29    EKG: Independently reviewed.  NSR with rate 60; low voltage, prolonged QT 555, nonspecific ST changes with no evidence of acute ischemia   Assessment/Plan Principal Problem:   Hypotension Active Problems:   Nausea   Abdominal pain   AKI (acute kidney injury) ([place])   Retroperitoneal mass   Hypotension related to decreased PO intake, AKI -Patient with nausea for several days -He lives in a SNF and brother reports that at baseline he worries that his brother does not get enough to drink -In conjunction with abdominal pain and nausea, it seems likely that he because dehydrated and this led to his hypotension -Will rehydrate overnight and recheck in AM -Na++ 133 -BUN 17/Creatinine 0.74, prior 0.43 on 1/17  Abdominal pain/nausea -Uncertain etiology -Patient did acknowledge abdominal pain even before palpitation and clearly appeared uncomfortable during palpation -He was unable to localize where it hurt most -CT obtained due to inability to obtain an effective history -He has mucosal thickening of the gastric body -He has been taking Prilosec 20 mg but this was increased to Protonix 40 mg while inpatient -Zofran prn -He may benefit from GI consult although it is not entirely clear that his brother would agree to EGD based on patient's expressed wishes  Retroperitoneal masses, B -Incidental finding on CT -Consider repeat CT in 3-6 months.    DVT prophylaxis: Lovenox Code Status: DNR - confirmed with family Family Communication: Brother present throughout evaluation Disposition Plan: Back to SNF once clinically improved Consults called: None Admission status: Admit - It is my clinical opinion that admission to INPATIENT is reasonable and necessary because this patient will  require at least 2 midnights in the hospital to treat this condition based on the medical complexity of the problems presented.  Given the aforementioned information, the predictability of an adverse outcome is felt to be significant.    Karmen Bongo MD [facility]  If 7PM-7AM, please contact night-coverage [email or web address] Password [email or web address]  08/20/2016, 11:52 PM

## 2016-08-20 NOTE — ED Triage Notes (Signed)
Pt resident of [facility].  Reports pt is s/p hip surgery.  Reports sob with therapy.  02 sat was 86% on room air but ems says 02 sat was 95% on room air.  [facility] staff also reports pt's bp was 86/60.  EMS reports bp 103/66.  Ems says pt is alert and oriented.  Pt c/o abd pain.  Family told ems that he had n/v Friday.  None today.

## 2016-08-20 NOTE — ED Provider Notes (Signed)
[facility] DEPT Provider Note   CSN: [id number] Arrival date & time: 08/20/16  1659     History   Chief Complaint Chief Complaint  Patient presents with  . Hypotension    HPI Alexander Hanna is a 76 y.o. male.    Pt was seen at 1750. Per pt and family, c/o gradual onset and resolution of one episode of "low O2 Sats" and "low blood pressure" that occurred today while being wheeled back from a meal. NH staff states pt's BP was "86/60" and "O2 Sat 86% R/A."   Pt's family states pt has had nausea and several episodes of "spit up" for the past 3 days, as well as upper abd "pain" and cough.  Denies diarrhea, no black or blood in stools or emesis, no back pain, no CP/palpitations, no SOB, no fevers.    Past Medical History:  Diagnosis Date  . Allergic rhinitis   . Anterior dislocation of left hip ([place])   . Anxiety   . Benign prostatic hyperplasia   . Constipation   . COPD (chronic obstructive pulmonary disease) ([place])   . Depression   . Dysphagia   . Encephalopathy   . Environmental allergies   . Gastrostomy status ([place])   . GERD (gastroesophageal reflux disease)   . Headache    brain damaged at age 63   . Hyperlipidemia   . Hypokalemia   . Hypothyroidism   . Incontinent of urine    wears a diaper per brother   . Iron deficiency anemia   . 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 Neuromuscular dysfunction of bladder   . Neuromuscular dysfunction of bladder   . Overactive bladder   . Polio    as a child  . Presence of artificial hip, left   . Protein calorie malnutrition ([place])   . Urinary retention   . Vitamin D deficiency     Patient Active Problem List   Diagnosis Date Noted  . 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 S/P Girdlestone left hip 04/02/2016  . S/P hip replacement 04/02/2016  . S/P percutaneous endoscopic gastrostomy (PEG) tube placement ([place]) 03/08/2015  . Protein-calorie malnutrition ([place]) 01/06/2014  . Dysphagia,  unspecified(787.20) 12/11/2013  . Unspecified protein-calorie malnutrition ([place]) 12/11/2013  . Rectal bleeding 12/18/2012  . Unspecified constipation 12/18/2012  . Leukocytosis, unspecified 12/07/2012  . Ambulatory dysfunction 11/22/2012  . Fall 11/22/2012  . Frequent falls 11/22/2012  . Compression fracture of L1 lumbar vertebra ([place]) 11/22/2012  . Unspecified hypothyroidism 11/22/2012  . Difficulty in walking(719.7) 09/28/2010  . Stiffness of joint, not elsewhere classified, pelvic region and thigh 09/28/2010  . KNEE, ARTHRITIS, DEGEN./OSTEO 05/31/2009  . ARTHRITIS, RIGHT HIP 05/31/2009  . SPINAL STENOSIS 05/31/2009  . OSTEOARTHRITIS, HIP, RIGHT 06/17/2008  . HIP PAIN 06/17/2008    Past Surgical History:  Procedure Laterality Date  . CATARACT EXTRACTION W/PHACO  12/20/2011   Procedure: CATARACT EXTRACTION PHACO AND INTRAOCULAR LENS PLACEMENT (IOC);  Surgeon: Tonny Branch, MD;  Location: [facility];  Service: Ophthalmology;  Laterality: Left;  CDE: 14.80  . CATARACT EXTRACTION W/PHACO  01/14/2012   Procedure: CATARACT EXTRACTION PHACO AND INTRAOCULAR LENS PLACEMENT (IOC);  Surgeon: Tonny Branch, MD;  Location: [facility];  Service: Ophthalmology;  Laterality: Right;  CDE 19.57  . CHOLECYSTECTOMY    . ESOPHAGOGASTRODUODENOSCOPY N/A 01/14/2014   Dr. Rourk:non-critical schatzki ring, not manipulated/small HHotherwise normal. 20 F PEG tube placed   . EXCISIONAL TOTAL HIP ARTHROPLASTY WITH  ANTIBIOTIC SPACERS Left 04/02/2016   Procedure: Resection left hip hemiarthroplasty- Girdlestone;  Surgeon: Paralee Cancel, MD;  Location: [facility];  Service: Orthopedics;  Laterality: Left;  . JOINT REPLACEMENT     rt anterior hip  . JOINT REPLACEMENT     Left hip  . PEG PLACEMENT N/A 01/14/2014   Procedure: PERCUTANEOUS ENDOSCOPIC GASTROSTOMY (PEG) PLACEMENT;  Surgeon: Daneil Dolin, MD;  Location: [facility];  Service: Endoscopy;  Laterality: N/A;  . PERCUTANEOUS ENDOSCOPIC GASTROSTOMY (PEG) REMOVAL N/A  03/10/2015   Procedure: PERCUTANEOUS ENDOSCOPIC GASTROSTOMY (PEG) REMOVAL;  Surgeon: Daneil Dolin, MD;  Location: [facility];  Service: Endoscopy;  Laterality: N/A;  1115  . WOUND DEBRIDEMENT Left    Post hip replacement       Home Medications    Prior to Admission medications   Medication Sig Start Date End Date Taking? Authorizing Provider  acetaminophen (TYLENOL) 325 MG tablet Take 1,300 mg by mouth every morning.   Yes [provider]  acidophilus (RISAQUAD) CAPS capsule Take 1 capsule by mouth 2 (two) times daily.   Yes [provider]  Amino Acids-Protein Hydrolys (FEEDING SUPPLEMENT, PRO-STAT SUGAR FREE 64,) LIQD Take 30 mLs by mouth 2 (two) times daily.   Yes [provider]  ascorbic acid (VITAMIN C) 500 MG tablet Take 500 mg by mouth daily.   Yes [provider]  aspirin EC 81 MG tablet Take 81 mg by mouth daily.   Yes [provider]  atorvastatin (LIPITOR) 10 MG tablet Take 10 mg by mouth daily at 6 PM.  01/24/15  Yes [provider]  baclofen (LIORESAL) 10 MG tablet Take 5 mg by mouth every 8 (eight) hours as needed for muscle spasms.   Yes [provider]  cetirizine (ZYRTEC) 10 MG tablet Take 10 mg by mouth daily.   Yes [provider]  cholecalciferol (VITAMIN D) 1000 units tablet Take 1,000 Units by mouth daily.   Yes [provider]  ezetimibe (ZETIA) 10 MG tablet Take 10 mg by mouth daily.   Yes [provider]  ferrous sulfate 325 (65 FE) MG tablet Take 325 mg by mouth 2 (two) times daily.    Yes [provider]  finasteride (PROSCAR) 5 MG tablet Take 5 mg by mouth daily.  02/10/15  Yes [provider]  fluticasone (FLONASE) 50 MCG/ACT nasal spray Place 2 sprays into the nose 2 (two) times daily. Allergies   Yes [provider]  guaiFENesin (MUCINEX) 600 MG 12 hr tablet Take 600 mg by mouth 2 (two) times daily.    Yes [provider]    HYDROcodone-acetaminophen (NORCO/VICODIN) 5-325 MG tablet Take 1 tablet by mouth 2 (two) times daily.   Yes [provider]  hydroxypropyl methylcellulose / hypromellose (ISOPTO TEARS / GONIOVISC) 2.5 % ophthalmic solution Place 1 drop into both eyes 4 (four) times daily as needed for dry eyes.   Yes [provider]  levothyroxine (SYNTHROID, LEVOTHROID) 150 MCG tablet Take 150 mcg by mouth at bedtime.   Yes [provider]  methocarbamol (ROBAXIN) 500 MG tablet Take 1 tablet (500 mg total) by mouth every 6 (six) hours as needed for muscle spasms. 04/04/16  Yes Babish, Rodman Key, PA-C  Multiple Vitamin (MULTIVITAMIN) tablet Take 1 tablet by mouth daily.   Yes [provider]  niacin (NIASPAN) 500 MG CR tablet Take 500 mg by mouth at bedtime.  02/24/15  Yes [provider]  omeprazole (PRILOSEC) 20 MG capsule  Take 20 mg by mouth daily.  02/12/15  Yes [provider]  polyethylene glycol (MIRALAX / GLYCOLAX) packet Take 17 g by mouth daily.   Yes [provider]  senna-docusate (SENNA-PLUS) 8.6-50 MG tablet Take 1 tablet by mouth 2 (two) times daily.   Yes [provider]  sulfamethoxazole-trimethoprim (BACTRIM DS,SEPTRA DS) 800-160 MG tablet Take 1 tablet by mouth 2 (two) times daily. 14 day course for hip infection starting on 08/07/16   Yes [provider]  tamsulosin (FLOMAX) 0.4 MG CAPS capsule Take 0.4 mg by mouth daily.    Yes [provider]  traZODone (DESYREL) 50 MG tablet Take 12.5 mg by mouth at bedtime.   Yes [provider]    Family History Family History  Problem Relation Age of Onset  . Lung cancer Mother   . Heart attack Father   . Angina Brother   . Coronary artery disease Brother     Social History Social History  Substance Use Topics  . Smoking status: Never Smoker  . Smokeless tobacco: Never Used     Comment: Never smoked much  . Alcohol use No     Allergies    Amoxicillin; Avelox [moxifloxacin hcl in nacl]; Hydrocodone; and Moxifloxacin   Review of Systems Review of Systems ROS: Statement: All systems negative except as marked or noted in the HPI; Constitutional: Negative for fever and chills. ; ; Eyes: Negative for eye pain, redness and discharge. ; ; ENMT: Negative for ear pain, hoarseness, nasal congestion, sinus pressure and sore throat. ; ; Cardiovascular: Negative for chest pain, palpitations, diaphoresis, dyspnea and peripheral edema. ; ; Respiratory: +cough. Negative for wheezing and stridor. ; ; Gastrointestinal: +abd pain, N/V. Negative for diarrhea, blood in stool, hematemesis, jaundice and rectal bleeding. . ; ; Genitourinary: Negative for dysuria, flank pain and hematuria. ; ; Musculoskeletal: Negative for back pain and neck pain. Negative for swelling and trauma.; ; Skin: Negative for pruritus, rash, abrasions, blisters, bruising and skin lesion.; ; Neuro: Negative for headache, lightheadedness and neck stiffness. Negative for weakness, altered level of consciousness, altered mental status, extremity weakness, paresthesias, involuntary movement, seizure and syncope.      Physical Exam Updated Vital Signs BP 102/62   Pulse (!) 57   Temp (!) 96.4 F (35.8 C) (Rectal)   Resp (!) 23   Ht 5\' 3"  (1.6 m)   Wt 68 kg (150 lb)   SpO2 97%   BMI 26.57 kg/m     Patient Vitals for the past 24 hrs:  BP Temp Temp src Pulse Resp SpO2 Height Weight  08/20/16 2100 - 97.5 F (36.4 C) Oral - - - - -  08/20/16 2100 109/65 - - (!) 57 19 96 % - -  08/20/16 2030 100/82 - - 63 (!) 23 95 % - -  08/20/16 1930 102/62 - - (!) 57 (!) 23 97 % - -  08/20/16 1900 96/65 - - (!) 55 15 98 % - -  08/20/16 1830 99/67 - - 64 16 98 % - -  08/20/16 1800 101/61 - - (!) 57 15 100 % - -  08/20/16 1739 - (!) 96.4 F (35.8 C) Rectal - - - - -  08/20/16 1730 107/63 - - (!) 55 14 96 % - -  08/20/16 1727 108/61 - - 63 18 100 % 5\' 3"  (1.6 m) 68 kg (150 lb)       Physical Exam 1755: Physical examination:  Nursing notes reviewed; Vital signs  and O2 SAT reviewed;  Constitutional: Well developed, Well nourished, In no acute distress; Head:  Normocephalic, atraumatic; Eyes: EOMI, PERRL, No scleral icterus; ENMT: Mouth and pharynx normal, Mucous membranes dry; Neck: Supple, Full range of motion, No lymphadenopathy; Cardiovascular: Regular rate and rhythm, No gallop; Respiratory: Breath sounds clear & equal bilaterally, No wheezes.  Speaking full sentences with ease, Normal respiratory effort/excursion; Chest: Nontender, Movement normal; Abdomen: Soft, +mid-epigastric tenderness to palp. No rebound or guarding. Nondistended, Normal bowel sounds; Genitourinary: No CVA tenderness; Extremities: Pulses normal, No tenderness, No edema, No calf edema or asymmetry.; Neuro: AA&Ox3, +HOH, otherwise major CN grossly intact.  Speech clear. Moves upper extremities, LE's spastic and LLE contracted, per hx..; Skin: Color normal, Warm, Dry.   ED Treatments / Results  Labs (all labs ordered are listed, but only abnormal results are displayed)   EKG  EKG Interpretation  Date/Time:  Monday August 20 2016 17:34:07 EDT Ventricular Rate:  60 PR Interval:    QRS Duration: 94 QT Interval:  555 QTC Calculation: 555 R Axis:   45 Text Interpretation:  Sinus rhythm Low voltage, precordial leads Borderline T abnormalities, anterior leads Prolonged QT interval When compared with ECG of 11/22/2012 QT has lengthened Confirmed by [facility]  MD, Nunzio Cory [phone]) on 08/20/2016 6:51:20 PM       Radiology   Procedures Procedures (including critical care time)  Medications Ordered in ED Medications  0.9 %  sodium chloride infusion (not administered)  sodium chloride 0.9 % bolus 500 mL (500 mLs Intravenous New Bag/Given 08/20/16 1945)     Initial Impression / Assessment and Plan / ED Course  I have reviewed the triage vital signs and the nursing notes.  Pertinent labs & imaging  results that were available during my care of the patient were reviewed by me and considered in my medical decision making (see chart for details).  MDM Reviewed: previous chart, nursing note and vitals Reviewed previous: labs and ECG Interpretation: labs, ECG and x-ray     Results for orders placed or performed during the hospital encounter of 08/20/16  Comprehensive metabolic panel  Result Value Ref Range   Sodium 133 (L) 135 - 145 mmol/L   Potassium 4.9 3.5 - 5.1 mmol/L   Chloride 98 (L) 101 - 111 mmol/L   CO2 25 22 - 32 mmol/L   Glucose, Bld 98 65 - 99 mg/dL   BUN 17 6 - 20 mg/dL   Creatinine, Ser 0.74 0.61 - 1.24 mg/dL   Calcium 9.4 8.9 - 10.3 mg/dL   Total Protein 8.1 6.5 - 8.1 g/dL   Albumin 4.2 3.5 - 5.0 g/dL   AST 34 15 - 41 U/L   ALT 17 17 - 63 U/L   Alkaline Phosphatase 91 38 - 126 U/L   Total Bilirubin 0.7 0.3 - 1.2 mg/dL   GFR calc non Af Amer >60 >60 mL/min   GFR calc Af Amer >60 >60 mL/min   Anion gap 10 5 - 15  Lipase, blood  Result Value Ref Range   Lipase 38 11 - 51 U/L  Troponin I  Result Value Ref Range   Troponin I <0.03 <0.03 ng/mL  Lactic acid, plasma  Result Value Ref Range   Lactic Acid, Venous 1.4 0.5 - 1.9 mmol/L  Lactic acid, plasma  Result Value Ref Range   Lactic Acid, Venous 0.9 0.5 - 1.9 mmol/L  CBC with Differential  Result Value Ref Range   WBC 8.0 4.0 - 10.5 K/uL   RBC  4.58 4.22 - 5.81 MIL/uL   Hemoglobin 13.4 13.0 - 17.0 g/dL   HCT 41.1 39.0 - 52.0 %   MCV 89.7 78.0 - 100.0 fL   MCH 29.3 26.0 - 34.0 pg   MCHC 32.6 30.0 - 36.0 g/dL   RDW 16.5 (H) 11.5 - 15.5 %   Platelets 220 150 - 400 K/uL   Neutrophils Relative % 75 %   Neutro Abs 6.0 1.7 - 7.7 K/uL   Lymphocytes Relative 18 %   Lymphs Abs 1.5 0.7 - 4.0 K/uL   Monocytes Relative 5 %   Monocytes Absolute 0.4 0.1 - 1.0 K/uL   Eosinophils Relative 2 %   Eosinophils Absolute 0.2 0.0 - 0.7 K/uL   Basophils Relative 0 %   Basophils Absolute 0.0 0.0 - 0.1 K/uL  Magnesium   Result Value Ref Range   Magnesium 2.3 1.7 - 2.4 mg/dL   Dg Chest Port 1 View Result Date: 08/20/2016 CLINICAL DATA:  Productive cough with nausea EXAM: PORTABLE CHEST 1 VIEW COMPARISON:  07/07/2013 FINDINGS: Mildly low lung volumes. No consolidation or effusion. Normal cardiomediastinal silhouette. No pneumothorax. IMPRESSION: No active disease. Electronically Signed   By: Donavan Foil M.D.   On: 08/20/2016 18:29    2130:  BP remains below baseline and pt has not had urine output despite IVF NS bolus x1L. Will continue IV hydration, observation admit. Dx and testing d/w pt and family.  Questions answered.  Verb understanding, agreeable to admit. T/C to [facility]. Lorin Mercy, case discussed, including:  HPI, pertinent PM/SHx, VS/PE, dx testing, ED course and treatment:  Agreeable to admit.       Final Clinical Impressions(s) / ED Diagnoses   Final diagnoses:  None    New Prescriptions New Prescriptions   No medications on file      Francine Graven, DO 08/24/16 1246

## 2016-08-20 NOTE — ED Notes (Signed)
Condom catheter bag empty at this time, condom cath correctly placed with no kinks

## 2016-08-20 NOTE — ED Notes (Signed)
Attempted to do an In and Out cath for urine and unable to get catheter in.  Placed condomn catheter after cleaning pt.

## 2016-08-20 NOTE — ED Notes (Signed)
Report to [place], RN 300

## 2016-08-21 DIAGNOSIS — E039 Hypothyroidism, unspecified: Secondary | ICD-10-CM | POA: Diagnosis not present

## 2016-08-21 DIAGNOSIS — R279 Unspecified lack of coordination: Secondary | ICD-10-CM | POA: Diagnosis not present

## 2016-08-21 DIAGNOSIS — J449 Chronic obstructive pulmonary disease, unspecified: Secondary | ICD-10-CM | POA: Diagnosis not present

## 2016-08-21 DIAGNOSIS — R338 Other retention of urine: Secondary | ICD-10-CM | POA: Diagnosis not present

## 2016-08-21 DIAGNOSIS — I950 Idiopathic hypotension: Secondary | ICD-10-CM

## 2016-08-21 DIAGNOSIS — R110 Nausea: Secondary | ICD-10-CM | POA: Diagnosis not present

## 2016-08-21 DIAGNOSIS — E860 Dehydration: Secondary | ICD-10-CM | POA: Diagnosis not present

## 2016-08-21 DIAGNOSIS — N179 Acute kidney failure, unspecified: Secondary | ICD-10-CM | POA: Diagnosis not present

## 2016-08-21 DIAGNOSIS — I959 Hypotension, unspecified: Secondary | ICD-10-CM | POA: Diagnosis not present

## 2016-08-21 DIAGNOSIS — Z7401 Bed confinement status: Secondary | ICD-10-CM | POA: Diagnosis not present

## 2016-08-21 DIAGNOSIS — R1030 Lower abdominal pain, unspecified: Secondary | ICD-10-CM

## 2016-08-21 DIAGNOSIS — N401 Enlarged prostate with lower urinary tract symptoms: Secondary | ICD-10-CM | POA: Diagnosis not present

## 2016-08-21 LAB — URINALYSIS, ROUTINE W REFLEX MICROSCOPIC
BACTERIA UA: NONE SEEN
BILIRUBIN URINE: NEGATIVE
GLUCOSE, UA: NEGATIVE mg/dL
Hgb urine dipstick: NEGATIVE
Ketones, ur: NEGATIVE mg/dL
NITRITE: NEGATIVE
PH: 6 (ref 5.0–8.0)
PROTEIN: NEGATIVE mg/dL
Specific Gravity, Urine: 1.015 (ref 1.005–1.030)
Squamous Epithelial / LPF: NONE SEEN

## 2016-08-21 LAB — CBC
HCT: 34.8 % — ABNORMAL LOW (ref 39.0–52.0)
Hemoglobin: 11.5 g/dL — ABNORMAL LOW (ref 13.0–17.0)
MCH: 29.6 pg (ref 26.0–34.0)
MCHC: 33 g/dL (ref 30.0–36.0)
MCV: 89.5 fL (ref 78.0–100.0)
PLATELETS: 205 10*3/uL (ref 150–400)
RBC: 3.89 MIL/uL — AB (ref 4.22–5.81)
RDW: 16.5 % — AB (ref 11.5–15.5)
WBC: 5.4 10*3/uL (ref 4.0–10.5)

## 2016-08-21 LAB — TSH: TSH: 14.944 u[IU]/mL — ABNORMAL HIGH (ref 0.350–4.500)

## 2016-08-21 LAB — BASIC METABOLIC PANEL
Anion gap: 5 (ref 5–15)
BUN: 15 mg/dL (ref 6–20)
CHLORIDE: 103 mmol/L (ref 101–111)
CO2: 24 mmol/L (ref 22–32)
CREATININE: 0.57 mg/dL — AB (ref 0.61–1.24)
Calcium: 8.4 mg/dL — ABNORMAL LOW (ref 8.9–10.3)
GFR calc Af Amer: >60 mL/min (ref >60)
GFR calc non Af Amer: >60 mL/min (ref >60)
GLUCOSE: 80 mg/dL (ref 65–99)
Potassium: 4 mmol/L (ref 3.5–5.1)
Sodium: 132 mmol/L — ABNORMAL LOW (ref 135–145)

## 2016-08-21 LAB — MRSA PCR SCREENING: MRSA by PCR: NEGATIVE

## 2016-08-21 MED ORDER — LEVOTHYROXINE SODIUM 75 MCG PO TABS: 175.0000 ug | ORAL_TABLET | Freq: Every day | ORAL | Status: DC

## 2016-08-21 MED ORDER — LORAZEPAM 2 MG/ML IJ SOLN
1.0000 mg | Freq: Once | INTRAMUSCULAR | Status: AC
  Administered 2016-08-21: 1 mg via INTRAVENOUS
  Filled 2016-08-21: qty 1

## 2016-08-21 MED ORDER — LIDOCAINE HCL 2 % EX GEL
1.0000 "application " | Freq: Once | CUTANEOUS | Status: AC
  Administered 2016-08-21: 1 via URETHRAL
  Filled 2016-08-21: qty 5

## 2016-08-21 MED ORDER — POLYVINYL ALCOHOL 1.4 % OP SOLN: 1.0000 [drp] | Freq: Four times a day (QID) | OPHTHALMIC | Status: DC | PRN

## 2016-08-21 MED ORDER — LEVOTHYROXINE SODIUM 175 MCG PO TABS: 175.0000 ug | ORAL_TABLET | Freq: Every day | ORAL | Status: AC

## 2016-08-21 NOTE — Clinical Social Work Note (Signed)
Clinical Social Work Assessment  Patient Details  Name: Alexander Hanna MRN: [id number] Date of Birth: February 22, 1941  Date of referral:  08/21/16               Reason for consult:  Discharge Planning                Permission sought to share information with:    Permission granted to share information::     Name::        Agency::  [facility].   Relationship::     Contact Information:  Brother, [name], listed on chart.   Housing/Transportation Living arrangements for the past 2 months:  [place] of Information:    Patient Interpreter Needed:  None Criminal Activity/Legal Involvement Pertinent to Current Situation/Hospitalization:  No - Comment as needed Significant Relationships:  Siblings Lives with:  Facility Resident Do you feel safe going back to the place where you live?  Yes Need for family participation in patient care:  Yes (Comment)  Care giving concerns: Facility resident. None identified at baseline.    Social Worker assessment / plan:  Patient has been at [facility] since 05/2013. He is a long term resident, uses a wheelchair, propels himself, feeds himself (with assistance) and can return at discharge. LCSW advised Jackelyn Poling that patient was discharging today and would return to the facility at discharge. LCSW advised that patient would not have a new FL2 as she was in the hospital for less than 24 hours. Patients brother confirmed Debbie's statements. He was advised of patient's discharge today.  LCSW sent clinicals to [facility].  LCSW arranged transportation. LCSW signing off.   Employment status:  Retired Forensic scientist:  Information systems manager, [employer] In [place] PT Recommendations:  Not assessed at this time Information / Referral to community resources:     Patient/Family's Response to care:  Family is agreeable to patient's care.   Patient/Family's Understanding of and Emotional Response to Diagnosis, Current Treatment, and Prognosis:  Patient's brother  is aware and understands patient's diagnosis, treatment and prognosis.   Emotional Assessment Appearance:  Appears stated age Attitude/Demeanor/Rapport:    Affect (typically observed):  Accepting, Calm Orientation:  Oriented to Self, Oriented to Place Alcohol / Substance use:  Not Applicable Psych involvement (Current and /or in the community):  No (Comment)  Discharge Needs  Concerns to be addressed:  No discharge needs identified Readmission within the last 30 days:  Yes Current discharge risk:  None Barriers to Discharge:  No Barriers Identified   Ihor Gully, LCSW 08/21/2016, 1:42 PM

## 2016-08-21 NOTE — Progress Notes (Signed)
Orthos are unable to be taken. Pt is not able to stand or sit up without falling forward .

## 2016-08-21 NOTE — Progress Notes (Signed)
8 French foley cath insertion attempted with no success. Resistance was met upon slight entry. MD was notified. Urology consult ordered for foley placement in am.

## 2016-08-21 NOTE — Discharge Summary (Signed)
Physician Discharge Summary  Alexander Hanna:[id number] DOB: 14-Jun-1940 DOA: 08/20/2016  PCP: Lucia Gaskins, MD  Admit date: 08/20/2016 Discharge date: 08/21/2016  Time spent: 45 minutes  Recommendations for Outpatient Follow-up:  -Will be discharged back to SNF today. -TSH was 14; his synthroid dose has been increased to 175 mcg. Please recheck TSH in 4-6 weeks. -Catheter in until follow up with urology in 1 week. -CT abdomen with retroperitoneal lymphadenopathy. Recommendation is to repeat in 3-6 months if family desires further work up.   Discharge Diagnoses:  Principal Problem:   Hypotension Active Problems:   Nausea   Abdominal pain   AKI (acute kidney injury) ([place])   Retroperitoneal mass   Dehydration   Discharge Condition: Stable and improved  Filed Weights   08/20/16 1727 08/20/16 2300  Weight: 68 kg (150 lb) 64.8 kg (142 lb 12.8 oz)    History of present illness:  As per Dr. Lorin Hanna on 6/4: Alexander Hanna is a 76 y.o. male with medical history significant of urinary retention, hearing loss, tunnel vision, intellectual disability, hypothyroidism, HLD, COPD, BPH, and left hip arthroplasty with subsequent removal of hardware resulting in bedbound state presenting from SNF with hypotension.  Brother sees him QOD - Wednesday he was nauseated but not vomiting.  Sunday, he was so nauseated he wanted to make himself vomit.  Today, about 3pm, his brother came in; the speech therapist told him that he very nauseated.  The nurse helped him to bed, BP 86/57, O2 was 78%.  Recheck BP 100/ and he was not as alert as usual.  They called the house doctor and decided to send him here.  Had a hip MRI recently.  Has been on medications, finished Bactrim this week for wound infection. With his nausea, he has not been eating/drinking. His brief was dry today when they tried to change him.  3 bags IVF and no urine yet.   Brother has worked as a Psychologist, occupational in cardiac rehab x 17  years. Tunnel vision, very hard of hearing, illiterate, intellectual disability.  ED Course: BP below baseline, no UOP despite NS x 1L.  Hospital Course:   Hypotension -Resolved. -Likely due to decreased PO intake. -PO fluid intake should be encouraged at SNF.  Urinary Retention -Due to BPH. -Continue flomax and proscar. -Keep foley in until follow up with urology in 1 week.  Retroperitoneal Masses -Unclear etiology. -Patient is very frail and weak. -Per radiology recommendations: repeat CT scan in 3-6 months for evaluation if family so desires.  Hypothyroidism -TSH 14. -Synthroid increased from 150 to 175 mcg. -Will need repeat thyroid function tests in 4-6 weeks.  Procedures:  None   Consultations:  None  Discharge Instructions  Discharge Instructions    Diet - low sodium heart healthy    Complete by:  As directed    Increase activity slowly    Complete by:  As directed      Allergies as of 08/21/2016      Reactions   Amoxicillin Other (See Comments)   Reaction unknown-provided via [place]. Pt's brother states he is not allergic to amoxicillin   Avelox [moxifloxacin Hcl In Nacl]    Hydrocodone Other (See Comments)   Reaction unknown-provided via Nursing Facility   Moxifloxacin Other (See Comments)   Reaction unknown-provided via Nursing Facility      Medication List    TAKE these medications   acetaminophen 325 MG tablet Commonly known as:  TYLENOL Take 1,300 mg by  mouth every morning.   acidophilus Caps capsule Take 1 capsule by mouth 2 (two) times daily.   ascorbic acid 500 MG tablet Commonly known as:  VITAMIN C Take 500 mg by mouth daily.   aspirin EC 81 MG tablet Take 81 mg by mouth daily.   atorvastatin 10 MG tablet Commonly known as:  LIPITOR Take 10 mg by mouth daily at 6 PM.   baclofen 10 MG tablet Commonly known as:  LIORESAL Take 5 mg by mouth every 8 (eight) hours as needed for muscle spasms.   cetirizine 10 MG  tablet Commonly known as:  ZYRTEC Take 10 mg by mouth daily.   cholecalciferol 1000 units tablet Commonly known as:  VITAMIN D Take 1,000 Units by mouth daily.   ezetimibe 10 MG tablet Commonly known as:  ZETIA Take 10 mg by mouth daily.   feeding supplement (PRO-STAT SUGAR FREE 64) Liqd Take 30 mLs by mouth 2 (two) times daily.   ferrous sulfate 325 (65 FE) MG tablet Take 325 mg by mouth 2 (two) times daily.   finasteride 5 MG tablet Commonly known as:  PROSCAR Take 5 mg by mouth daily.   fluticasone 50 MCG/ACT nasal spray Commonly known as:  FLONASE Place 2 sprays into the nose 2 (two) times daily. Allergies   guaiFENesin 600 MG 12 hr tablet Commonly known as:  MUCINEX Take 600 mg by mouth 2 (two) times daily.   HYDROcodone-acetaminophen 5-325 MG tablet Commonly known as:  NORCO/VICODIN Take 1 tablet by mouth 2 (two) times daily.   hydroxypropyl methylcellulose / hypromellose 2.5 % ophthalmic solution Commonly known as:  ISOPTO TEARS / GONIOVISC Place 1 drop into both eyes 4 (four) times daily as needed for dry eyes.   levothyroxine 175 MCG tablet Commonly known as:  SYNTHROID, LEVOTHROID Take 1 tablet (175 mcg total) by mouth at bedtime. What changed:  medication strength  how much to take   methocarbamol 500 MG tablet Commonly known as:  ROBAXIN Take 1 tablet (500 mg total) by mouth every 6 (six) hours as needed for muscle spasms.   multivitamin tablet Take 1 tablet by mouth daily.   niacin 500 MG CR tablet Commonly known as:  NIASPAN Take 500 mg by mouth at bedtime.   omeprazole 20 MG capsule Commonly known as:  PRILOSEC Take 20 mg by mouth daily.   polyethylene glycol packet Commonly known as:  MIRALAX / GLYCOLAX Take 17 g by mouth daily.   SENNA-PLUS 8.6-50 MG tablet Generic drug:  senna-docusate Take 1 tablet by mouth 2 (two) times daily.   tamsulosin 0.4 MG Caps capsule Commonly known as:  FLOMAX Take 0.4 mg by mouth daily.    traZODone 50 MG tablet Commonly known as:  DESYREL Take 12.5 mg by mouth at bedtime.      Allergies  Allergen Reactions  . Amoxicillin Other (See Comments)    Reaction unknown-provided via [place]. Pt's brother states he is not allergic to amoxicillin  . Avelox [Moxifloxacin Hcl In Nacl]   . Hydrocodone Other (See Comments)    Reaction unknown-provided via [place]  . Moxifloxacin Other (See Comments)    Reaction unknown-provided via [place]    Lucia Gaskins, MD. Schedule an appointment as soon as possible for a visit in 2 week(s).   Specialty:  Internal Medicine Contact information: [place] [address] [phone]            The results of significant  diagnostics from this hospitalization (including imaging, microbiology, ancillary and laboratory) are listed below for reference.    Significant Diagnostic Studies: Ct Abdomen Pelvis Wo Contrast  Result Date: 08/20/2016 CLINICAL DATA:  Abdominal pain.  Recent hip surgery. EXAM: CT ABDOMEN AND PELVIS WITHOUT CONTRAST TECHNIQUE: Multidetector CT imaging of the abdomen and pelvis was performed following the standard protocol without IV contrast. COMPARISON:  None. FINDINGS: Lower chest: Bibasilar hypoventilatory changes. Hepatobiliary: No focal liver abnormality is seen. Status post cholecystectomy. No biliary dilatation. Pancreas: Unremarkable. No pancreatic ductal dilatation or surrounding inflammatory changes. Spleen: Normal in size without focal abnormality. Adrenals/Urinary Tract: There are bilateral nonobstructive renal calculi and bilateral renal cysts. The adrenal glands appear normal. There are circumscribed masses in bilateral retroperitoneum measuring water density. The largest of these in the right retroperitoneum measures 7.5 Cm. Stomach/Bowel: Mucosal thickening of the gastric body. No evidence of small-bowel obstruction. Large amount of formed  stool within the rectum. Vascular/Lymphatic: Aortic atherosclerosis. Reproductive: Nonenlarged prostate. Other: None. Musculoskeletal: Post right hip arthroplasty. Postsurgical absence of the left femoral head. The left femur is superiorly dislocated. Fat stranding and muscle stranding surrounds the femoral neck. It is difficult to determine whether these the postsurgical or infectious changes. Severe compression deformity of L1 vertebral body IMPRESSION: Circumscribed water density bilateral retroperitoneal masses with the largest in the right retroperitoneum measuring 7.5 cm. These may represent exophytic renal cysts, or lymph angiomas. Necrotic retroperitoneal lymphadenopathy although on the differential diagnosis is considered less likely. Follow-up in 3-6 months may be considered. Mucosal thickening of the gastric body. Please correlate to upper endoscopy results. Post removal of left hip arthroplasty hardware with superior dislocation of the left femur. Soft tissue thickening surrounding the left femoral neck.It is difficult to determine whether this represents postsurgical or infectious changes. Severe compression deformity of L1 vertebral body with approximately 90% height loss anteriorly, progressed from 11/24/2012, with focal kyphotic deformity of the spine. Electronically Signed   By: Fidela Salisbury M.D.   On: 08/20/2016 23:13   Dg Chest Port 1 View  Result Date: 08/20/2016 CLINICAL DATA:  Productive cough with nausea EXAM: PORTABLE CHEST 1 VIEW COMPARISON:  07/07/2013 FINDINGS: Mildly low lung volumes. No consolidation or effusion. Normal cardiomediastinal silhouette. No pneumothorax. IMPRESSION: No active disease. Electronically Signed   By: Donavan Foil M.D.   On: 08/20/2016 18:29    Microbiology: Recent Results (from the past 240 hour(s))  MRSA PCR Screening     Status: None   Collection Time: 08/21/16 12:33 AM  Result Value Ref Range Status   MRSA by PCR NEGATIVE NEGATIVE Final     Comment:        The GeneXpert MRSA Assay (FDA approved for NASAL specimens only), is one component of a comprehensive MRSA colonization surveillance program. It is not intended to diagnose MRSA infection nor to guide or monitor treatment for MRSA infections.      Labs: Basic Metabolic Panel:  Recent Labs Lab 08/20/16 1827 08/20/16 1925 08/21/16 0547  NA 133*  --  132*  K 4.9  --  4.0  CL 98*  --  103  CO2 25  --  24  GLUCOSE 98  --  80  BUN 17  --  15  CREATININE 0.74  --  0.57*  CALCIUM 9.4  --  8.4*  MG  --  2.3  --    Liver Function Tests:  Recent Labs Lab 08/20/16 1827  AST 34  ALT 17  ALKPHOS 91  BILITOT 0.7  PROT 8.1  ALBUMIN 4.2    Recent Labs Lab 08/20/16 1827  LIPASE 38   No results for input(s): AMMONIA in the last 168 hours. CBC:  Recent Labs Lab 08/20/16 1827 08/21/16 0547  WBC 8.0 5.4  NEUTROABS 6.0  --   HGB 13.4 11.5*  HCT 41.1 34.8*  MCV 89.7 89.5  PLT 220 205   Cardiac Enzymes:  Recent Labs Lab 08/20/16 1827  TROPONINI <0.03   BNP: BNP (last 3 results) No results for input(s): BNP in the last 8760 hours.  ProBNP (last 3 results) No results for input(s): PROBNP in the last 8760 hours.  CBG: No results for input(s): GLUCAP in the last 168 hours.     SignedLelon Frohlich  [facility] Pager: [phone] 08/21/2016, 11:23 AM

## 2016-08-21 NOTE — Consult Note (Signed)
   [facility] CM Inpatient Consult   08/21/2016  Alexander Hanna 1941-01-14 [id number]   Patient screened for potential [place] Management services. Patient is eligible for [place]. Electronic medical record reveals patient's discharge plan is to return to [facility] where he is a long term care resident and there were no identifiable [facility] care management needs. [facility] Care Management services not appropriate at this time. If patient's post hospital needs change please place a [facility] Care Management consult. For questions please contact:   Emmalise Huard RN, [facility] Liaison  [phone]) Business Mobile [phone]) Toll free office

## 2016-08-21 NOTE — Care Management Note (Signed)
Case Management Note  Patient Details  Name: Alexander Hanna MRN: [id number] Date of Birth: Jun 05, 1940  Subjective/Objective:                  Admitted with hypotension. Pt from [facility] where he is a LTR. CSW is aware pt comes from facility.   Action/Plan: Anticipate return to LTC at DC. No CM needs anticipated.   Expected Discharge Date:     08/23/2016             Expected Discharge Plan:  [place]  In-House Referral:  Clinical Social Work  Discharge planning Services  CM Consult  Post Acute Care Choice:  NA Choice offered to:  NA  Status of Service:  Completed, signed off  Sherald Barge, RN 08/21/2016, 10:34 AM

## 2016-08-22 LAB — URINE CULTURE: CULTURE: NO GROWTH

## 2016-08-29 DIAGNOSIS — R338 Other retention of urine: Secondary | ICD-10-CM | POA: Diagnosis not present

## 2016-08-31 DIAGNOSIS — R110 Nausea: Secondary | ICD-10-CM | POA: Diagnosis not present

## 2016-08-31 DIAGNOSIS — R338 Other retention of urine: Secondary | ICD-10-CM | POA: Diagnosis not present

## 2016-08-31 DIAGNOSIS — R1900 Intra-abdominal and pelvic swelling, mass and lump, unspecified site: Secondary | ICD-10-CM | POA: Diagnosis not present

## 2016-08-31 DIAGNOSIS — E860 Dehydration: Secondary | ICD-10-CM | POA: Diagnosis not present

## 2016-09-05 DIAGNOSIS — R338 Other retention of urine: Secondary | ICD-10-CM | POA: Diagnosis not present

## 2016-09-07 DIAGNOSIS — L853 Xerosis cutis: Secondary | ICD-10-CM | POA: Diagnosis not present

## 2016-09-07 DIAGNOSIS — R338 Other retention of urine: Secondary | ICD-10-CM | POA: Diagnosis not present

## 2016-09-07 DIAGNOSIS — R1900 Intra-abdominal and pelvic swelling, mass and lump, unspecified site: Secondary | ICD-10-CM | POA: Diagnosis not present

## 2016-09-07 DIAGNOSIS — R339 Retention of urine, unspecified: Secondary | ICD-10-CM | POA: Diagnosis not present

## 2016-09-16 DIAGNOSIS — R1310 Dysphagia, unspecified: Secondary | ICD-10-CM | POA: Diagnosis not present

## 2016-09-16 DIAGNOSIS — R41841 Cognitive communication deficit: Secondary | ICD-10-CM | POA: Diagnosis not present

## 2016-09-16 DIAGNOSIS — M4800 Spinal stenosis, site unspecified: Secondary | ICD-10-CM | POA: Diagnosis not present

## 2016-09-16 DIAGNOSIS — Z9889 Other specified postprocedural states: Secondary | ICD-10-CM | POA: Diagnosis not present

## 2016-09-16 DIAGNOSIS — Z89622 Acquired absence of left hip joint: Secondary | ICD-10-CM | POA: Diagnosis not present

## 2016-09-16 DIAGNOSIS — M6281 Muscle weakness (generalized): Secondary | ICD-10-CM | POA: Diagnosis not present

## 2016-09-16 DIAGNOSIS — Z96642 Presence of left artificial hip joint: Secondary | ICD-10-CM | POA: Diagnosis not present

## 2016-09-17 DIAGNOSIS — R1310 Dysphagia, unspecified: Secondary | ICD-10-CM | POA: Diagnosis not present

## 2016-09-17 DIAGNOSIS — M6281 Muscle weakness (generalized): Secondary | ICD-10-CM | POA: Diagnosis not present

## 2016-09-17 DIAGNOSIS — R41841 Cognitive communication deficit: Secondary | ICD-10-CM | POA: Diagnosis not present

## 2016-09-17 DIAGNOSIS — Z9889 Other specified postprocedural states: Secondary | ICD-10-CM | POA: Diagnosis not present

## 2016-09-17 DIAGNOSIS — Z89622 Acquired absence of left hip joint: Secondary | ICD-10-CM | POA: Diagnosis not present

## 2016-09-17 DIAGNOSIS — M4800 Spinal stenosis, site unspecified: Secondary | ICD-10-CM | POA: Diagnosis not present

## 2016-09-18 DIAGNOSIS — R1310 Dysphagia, unspecified: Secondary | ICD-10-CM | POA: Diagnosis not present

## 2016-09-18 DIAGNOSIS — Z9889 Other specified postprocedural states: Secondary | ICD-10-CM | POA: Diagnosis not present

## 2016-09-18 DIAGNOSIS — M4800 Spinal stenosis, site unspecified: Secondary | ICD-10-CM | POA: Diagnosis not present

## 2016-09-18 DIAGNOSIS — M6281 Muscle weakness (generalized): Secondary | ICD-10-CM | POA: Diagnosis not present

## 2016-09-18 DIAGNOSIS — Z89622 Acquired absence of left hip joint: Secondary | ICD-10-CM | POA: Diagnosis not present

## 2016-09-18 DIAGNOSIS — R41841 Cognitive communication deficit: Secondary | ICD-10-CM | POA: Diagnosis not present

## 2016-09-19 DIAGNOSIS — M4800 Spinal stenosis, site unspecified: Secondary | ICD-10-CM | POA: Diagnosis not present

## 2016-09-19 DIAGNOSIS — Z89622 Acquired absence of left hip joint: Secondary | ICD-10-CM | POA: Diagnosis not present

## 2016-09-19 DIAGNOSIS — R41841 Cognitive communication deficit: Secondary | ICD-10-CM | POA: Diagnosis not present

## 2016-09-19 DIAGNOSIS — Z9889 Other specified postprocedural states: Secondary | ICD-10-CM | POA: Diagnosis not present

## 2016-09-19 DIAGNOSIS — R1310 Dysphagia, unspecified: Secondary | ICD-10-CM | POA: Diagnosis not present

## 2016-09-19 DIAGNOSIS — M6281 Muscle weakness (generalized): Secondary | ICD-10-CM | POA: Diagnosis not present

## 2016-09-20 DIAGNOSIS — M6281 Muscle weakness (generalized): Secondary | ICD-10-CM | POA: Diagnosis not present

## 2016-09-20 DIAGNOSIS — Z9889 Other specified postprocedural states: Secondary | ICD-10-CM | POA: Diagnosis not present

## 2016-09-20 DIAGNOSIS — Z89622 Acquired absence of left hip joint: Secondary | ICD-10-CM | POA: Diagnosis not present

## 2016-09-20 DIAGNOSIS — M4800 Spinal stenosis, site unspecified: Secondary | ICD-10-CM | POA: Diagnosis not present

## 2016-09-20 DIAGNOSIS — R1310 Dysphagia, unspecified: Secondary | ICD-10-CM | POA: Diagnosis not present

## 2016-09-20 DIAGNOSIS — R41841 Cognitive communication deficit: Secondary | ICD-10-CM | POA: Diagnosis not present

## 2016-09-21 DIAGNOSIS — M6281 Muscle weakness (generalized): Secondary | ICD-10-CM | POA: Diagnosis not present

## 2016-09-21 DIAGNOSIS — Z9889 Other specified postprocedural states: Secondary | ICD-10-CM | POA: Diagnosis not present

## 2016-09-21 DIAGNOSIS — Z89622 Acquired absence of left hip joint: Secondary | ICD-10-CM | POA: Diagnosis not present

## 2016-09-21 DIAGNOSIS — M4800 Spinal stenosis, site unspecified: Secondary | ICD-10-CM | POA: Diagnosis not present

## 2016-09-21 DIAGNOSIS — R41841 Cognitive communication deficit: Secondary | ICD-10-CM | POA: Diagnosis not present

## 2016-09-21 DIAGNOSIS — R1310 Dysphagia, unspecified: Secondary | ICD-10-CM | POA: Diagnosis not present

## 2016-09-23 DIAGNOSIS — Z89622 Acquired absence of left hip joint: Secondary | ICD-10-CM | POA: Diagnosis not present

## 2016-09-23 DIAGNOSIS — R41841 Cognitive communication deficit: Secondary | ICD-10-CM | POA: Diagnosis not present

## 2016-09-23 DIAGNOSIS — Z9889 Other specified postprocedural states: Secondary | ICD-10-CM | POA: Diagnosis not present

## 2016-09-23 DIAGNOSIS — M6281 Muscle weakness (generalized): Secondary | ICD-10-CM | POA: Diagnosis not present

## 2016-09-23 DIAGNOSIS — M4800 Spinal stenosis, site unspecified: Secondary | ICD-10-CM | POA: Diagnosis not present

## 2016-09-23 DIAGNOSIS — R1310 Dysphagia, unspecified: Secondary | ICD-10-CM | POA: Diagnosis not present

## 2016-09-24 DIAGNOSIS — Z9889 Other specified postprocedural states: Secondary | ICD-10-CM | POA: Diagnosis not present

## 2016-09-24 DIAGNOSIS — R41841 Cognitive communication deficit: Secondary | ICD-10-CM | POA: Diagnosis not present

## 2016-09-24 DIAGNOSIS — R1310 Dysphagia, unspecified: Secondary | ICD-10-CM | POA: Diagnosis not present

## 2016-09-24 DIAGNOSIS — M4800 Spinal stenosis, site unspecified: Secondary | ICD-10-CM | POA: Diagnosis not present

## 2016-09-24 DIAGNOSIS — M6281 Muscle weakness (generalized): Secondary | ICD-10-CM | POA: Diagnosis not present

## 2016-09-24 DIAGNOSIS — Z89622 Acquired absence of left hip joint: Secondary | ICD-10-CM | POA: Diagnosis not present

## 2016-09-25 DIAGNOSIS — M4800 Spinal stenosis, site unspecified: Secondary | ICD-10-CM | POA: Diagnosis not present

## 2016-09-25 DIAGNOSIS — Z89622 Acquired absence of left hip joint: Secondary | ICD-10-CM | POA: Diagnosis not present

## 2016-09-25 DIAGNOSIS — R41841 Cognitive communication deficit: Secondary | ICD-10-CM | POA: Diagnosis not present

## 2016-09-25 DIAGNOSIS — Z9889 Other specified postprocedural states: Secondary | ICD-10-CM | POA: Diagnosis not present

## 2016-09-25 DIAGNOSIS — M6281 Muscle weakness (generalized): Secondary | ICD-10-CM | POA: Diagnosis not present

## 2016-09-25 DIAGNOSIS — R1310 Dysphagia, unspecified: Secondary | ICD-10-CM | POA: Diagnosis not present

## 2016-09-26 DIAGNOSIS — M4800 Spinal stenosis, site unspecified: Secondary | ICD-10-CM | POA: Diagnosis not present

## 2016-09-26 DIAGNOSIS — Z89622 Acquired absence of left hip joint: Secondary | ICD-10-CM | POA: Diagnosis not present

## 2016-09-26 DIAGNOSIS — R41841 Cognitive communication deficit: Secondary | ICD-10-CM | POA: Diagnosis not present

## 2016-09-26 DIAGNOSIS — M6281 Muscle weakness (generalized): Secondary | ICD-10-CM | POA: Diagnosis not present

## 2016-09-26 DIAGNOSIS — R1310 Dysphagia, unspecified: Secondary | ICD-10-CM | POA: Diagnosis not present

## 2016-09-26 DIAGNOSIS — Z9889 Other specified postprocedural states: Secondary | ICD-10-CM | POA: Diagnosis not present

## 2016-09-27 DIAGNOSIS — Z9889 Other specified postprocedural states: Secondary | ICD-10-CM | POA: Diagnosis not present

## 2016-09-27 DIAGNOSIS — Z89622 Acquired absence of left hip joint: Secondary | ICD-10-CM | POA: Diagnosis not present

## 2016-09-27 DIAGNOSIS — M4800 Spinal stenosis, site unspecified: Secondary | ICD-10-CM | POA: Diagnosis not present

## 2016-09-27 DIAGNOSIS — R41841 Cognitive communication deficit: Secondary | ICD-10-CM | POA: Diagnosis not present

## 2016-09-27 DIAGNOSIS — M6281 Muscle weakness (generalized): Secondary | ICD-10-CM | POA: Diagnosis not present

## 2016-09-27 DIAGNOSIS — R1310 Dysphagia, unspecified: Secondary | ICD-10-CM | POA: Diagnosis not present

## 2016-09-28 DIAGNOSIS — R41841 Cognitive communication deficit: Secondary | ICD-10-CM | POA: Diagnosis not present

## 2016-09-28 DIAGNOSIS — Z9889 Other specified postprocedural states: Secondary | ICD-10-CM | POA: Diagnosis not present

## 2016-09-28 DIAGNOSIS — M6281 Muscle weakness (generalized): Secondary | ICD-10-CM | POA: Diagnosis not present

## 2016-09-28 DIAGNOSIS — Z89622 Acquired absence of left hip joint: Secondary | ICD-10-CM | POA: Diagnosis not present

## 2016-09-28 DIAGNOSIS — M4800 Spinal stenosis, site unspecified: Secondary | ICD-10-CM | POA: Diagnosis not present

## 2016-09-28 DIAGNOSIS — R1310 Dysphagia, unspecified: Secondary | ICD-10-CM | POA: Diagnosis not present

## 2016-10-01 DIAGNOSIS — R1310 Dysphagia, unspecified: Secondary | ICD-10-CM | POA: Diagnosis not present

## 2016-10-01 DIAGNOSIS — M4800 Spinal stenosis, site unspecified: Secondary | ICD-10-CM | POA: Diagnosis not present

## 2016-10-01 DIAGNOSIS — Z89622 Acquired absence of left hip joint: Secondary | ICD-10-CM | POA: Diagnosis not present

## 2016-10-01 DIAGNOSIS — M6281 Muscle weakness (generalized): Secondary | ICD-10-CM | POA: Diagnosis not present

## 2016-10-01 DIAGNOSIS — R41841 Cognitive communication deficit: Secondary | ICD-10-CM | POA: Diagnosis not present

## 2016-10-01 DIAGNOSIS — Z9889 Other specified postprocedural states: Secondary | ICD-10-CM | POA: Diagnosis not present

## 2016-10-02 DIAGNOSIS — Z9889 Other specified postprocedural states: Secondary | ICD-10-CM | POA: Diagnosis not present

## 2016-10-02 DIAGNOSIS — M4800 Spinal stenosis, site unspecified: Secondary | ICD-10-CM | POA: Diagnosis not present

## 2016-10-02 DIAGNOSIS — Z89622 Acquired absence of left hip joint: Secondary | ICD-10-CM | POA: Diagnosis not present

## 2016-10-02 DIAGNOSIS — M6281 Muscle weakness (generalized): Secondary | ICD-10-CM | POA: Diagnosis not present

## 2016-10-02 DIAGNOSIS — R41841 Cognitive communication deficit: Secondary | ICD-10-CM | POA: Diagnosis not present

## 2016-10-02 DIAGNOSIS — D649 Anemia, unspecified: Secondary | ICD-10-CM | POA: Diagnosis not present

## 2016-10-02 DIAGNOSIS — E039 Hypothyroidism, unspecified: Secondary | ICD-10-CM | POA: Diagnosis not present

## 2016-10-02 DIAGNOSIS — R1310 Dysphagia, unspecified: Secondary | ICD-10-CM | POA: Diagnosis not present

## 2016-10-03 DIAGNOSIS — R41841 Cognitive communication deficit: Secondary | ICD-10-CM | POA: Diagnosis not present

## 2016-10-03 DIAGNOSIS — Z9889 Other specified postprocedural states: Secondary | ICD-10-CM | POA: Diagnosis not present

## 2016-10-03 DIAGNOSIS — Z89622 Acquired absence of left hip joint: Secondary | ICD-10-CM | POA: Diagnosis not present

## 2016-10-03 DIAGNOSIS — M4800 Spinal stenosis, site unspecified: Secondary | ICD-10-CM | POA: Diagnosis not present

## 2016-10-03 DIAGNOSIS — R1310 Dysphagia, unspecified: Secondary | ICD-10-CM | POA: Diagnosis not present

## 2016-10-03 DIAGNOSIS — M6281 Muscle weakness (generalized): Secondary | ICD-10-CM | POA: Diagnosis not present

## 2016-10-04 DIAGNOSIS — R1310 Dysphagia, unspecified: Secondary | ICD-10-CM | POA: Diagnosis not present

## 2016-10-04 DIAGNOSIS — Z89622 Acquired absence of left hip joint: Secondary | ICD-10-CM | POA: Diagnosis not present

## 2016-10-04 DIAGNOSIS — M4800 Spinal stenosis, site unspecified: Secondary | ICD-10-CM | POA: Diagnosis not present

## 2016-10-04 DIAGNOSIS — R41841 Cognitive communication deficit: Secondary | ICD-10-CM | POA: Diagnosis not present

## 2016-10-04 DIAGNOSIS — Z9889 Other specified postprocedural states: Secondary | ICD-10-CM | POA: Diagnosis not present

## 2016-10-04 DIAGNOSIS — M6281 Muscle weakness (generalized): Secondary | ICD-10-CM | POA: Diagnosis not present

## 2016-10-05 DIAGNOSIS — R41841 Cognitive communication deficit: Secondary | ICD-10-CM | POA: Diagnosis not present

## 2016-10-05 DIAGNOSIS — M4800 Spinal stenosis, site unspecified: Secondary | ICD-10-CM | POA: Diagnosis not present

## 2016-10-05 DIAGNOSIS — M6281 Muscle weakness (generalized): Secondary | ICD-10-CM | POA: Diagnosis not present

## 2016-10-05 DIAGNOSIS — Z89622 Acquired absence of left hip joint: Secondary | ICD-10-CM | POA: Diagnosis not present

## 2016-10-05 DIAGNOSIS — R1310 Dysphagia, unspecified: Secondary | ICD-10-CM | POA: Diagnosis not present

## 2016-10-05 DIAGNOSIS — Z9889 Other specified postprocedural states: Secondary | ICD-10-CM | POA: Diagnosis not present

## 2016-10-08 DIAGNOSIS — M4800 Spinal stenosis, site unspecified: Secondary | ICD-10-CM | POA: Diagnosis not present

## 2016-10-08 DIAGNOSIS — M6281 Muscle weakness (generalized): Secondary | ICD-10-CM | POA: Diagnosis not present

## 2016-10-08 DIAGNOSIS — Z89622 Acquired absence of left hip joint: Secondary | ICD-10-CM | POA: Diagnosis not present

## 2016-10-08 DIAGNOSIS — R41841 Cognitive communication deficit: Secondary | ICD-10-CM | POA: Diagnosis not present

## 2016-10-08 DIAGNOSIS — Z9889 Other specified postprocedural states: Secondary | ICD-10-CM | POA: Diagnosis not present

## 2016-10-08 DIAGNOSIS — R1310 Dysphagia, unspecified: Secondary | ICD-10-CM | POA: Diagnosis not present

## 2016-10-09 DIAGNOSIS — R41841 Cognitive communication deficit: Secondary | ICD-10-CM | POA: Diagnosis not present

## 2016-10-09 DIAGNOSIS — R1310 Dysphagia, unspecified: Secondary | ICD-10-CM | POA: Diagnosis not present

## 2016-10-09 DIAGNOSIS — M6281 Muscle weakness (generalized): Secondary | ICD-10-CM | POA: Diagnosis not present

## 2016-10-09 DIAGNOSIS — M4800 Spinal stenosis, site unspecified: Secondary | ICD-10-CM | POA: Diagnosis not present

## 2016-10-09 DIAGNOSIS — Z9889 Other specified postprocedural states: Secondary | ICD-10-CM | POA: Diagnosis not present

## 2016-10-09 DIAGNOSIS — Z89622 Acquired absence of left hip joint: Secondary | ICD-10-CM | POA: Diagnosis not present

## 2016-10-10 DIAGNOSIS — R41841 Cognitive communication deficit: Secondary | ICD-10-CM | POA: Diagnosis not present

## 2016-10-10 DIAGNOSIS — M4800 Spinal stenosis, site unspecified: Secondary | ICD-10-CM | POA: Diagnosis not present

## 2016-10-10 DIAGNOSIS — R1310 Dysphagia, unspecified: Secondary | ICD-10-CM | POA: Diagnosis not present

## 2016-10-10 DIAGNOSIS — M6281 Muscle weakness (generalized): Secondary | ICD-10-CM | POA: Diagnosis not present

## 2016-10-10 DIAGNOSIS — Z9889 Other specified postprocedural states: Secondary | ICD-10-CM | POA: Diagnosis not present

## 2016-10-10 DIAGNOSIS — Z89622 Acquired absence of left hip joint: Secondary | ICD-10-CM | POA: Diagnosis not present

## 2016-10-11 DIAGNOSIS — M4800 Spinal stenosis, site unspecified: Secondary | ICD-10-CM | POA: Diagnosis not present

## 2016-10-11 DIAGNOSIS — Z9889 Other specified postprocedural states: Secondary | ICD-10-CM | POA: Diagnosis not present

## 2016-10-11 DIAGNOSIS — M6281 Muscle weakness (generalized): Secondary | ICD-10-CM | POA: Diagnosis not present

## 2016-10-11 DIAGNOSIS — Z89622 Acquired absence of left hip joint: Secondary | ICD-10-CM | POA: Diagnosis not present

## 2016-10-11 DIAGNOSIS — R1310 Dysphagia, unspecified: Secondary | ICD-10-CM | POA: Diagnosis not present

## 2016-10-11 DIAGNOSIS — R41841 Cognitive communication deficit: Secondary | ICD-10-CM | POA: Diagnosis not present

## 2016-10-12 DIAGNOSIS — M6281 Muscle weakness (generalized): Secondary | ICD-10-CM | POA: Diagnosis not present

## 2016-10-12 DIAGNOSIS — R41841 Cognitive communication deficit: Secondary | ICD-10-CM | POA: Diagnosis not present

## 2016-10-12 DIAGNOSIS — M4800 Spinal stenosis, site unspecified: Secondary | ICD-10-CM | POA: Diagnosis not present

## 2016-10-12 DIAGNOSIS — Z9889 Other specified postprocedural states: Secondary | ICD-10-CM | POA: Diagnosis not present

## 2016-10-12 DIAGNOSIS — R1310 Dysphagia, unspecified: Secondary | ICD-10-CM | POA: Diagnosis not present

## 2016-10-12 DIAGNOSIS — Z89622 Acquired absence of left hip joint: Secondary | ICD-10-CM | POA: Diagnosis not present

## 2016-10-15 DIAGNOSIS — Z9889 Other specified postprocedural states: Secondary | ICD-10-CM | POA: Diagnosis not present

## 2016-10-15 DIAGNOSIS — M4800 Spinal stenosis, site unspecified: Secondary | ICD-10-CM | POA: Diagnosis not present

## 2016-10-15 DIAGNOSIS — R1310 Dysphagia, unspecified: Secondary | ICD-10-CM | POA: Diagnosis not present

## 2016-10-15 DIAGNOSIS — Z89622 Acquired absence of left hip joint: Secondary | ICD-10-CM | POA: Diagnosis not present

## 2016-10-15 DIAGNOSIS — M6281 Muscle weakness (generalized): Secondary | ICD-10-CM | POA: Diagnosis not present

## 2016-10-15 DIAGNOSIS — R41841 Cognitive communication deficit: Secondary | ICD-10-CM | POA: Diagnosis not present

## 2016-10-16 DIAGNOSIS — R1310 Dysphagia, unspecified: Secondary | ICD-10-CM | POA: Diagnosis not present

## 2016-10-16 DIAGNOSIS — R41841 Cognitive communication deficit: Secondary | ICD-10-CM | POA: Diagnosis not present

## 2016-10-16 DIAGNOSIS — M6281 Muscle weakness (generalized): Secondary | ICD-10-CM | POA: Diagnosis not present

## 2016-10-16 DIAGNOSIS — M4800 Spinal stenosis, site unspecified: Secondary | ICD-10-CM | POA: Diagnosis not present

## 2016-10-16 DIAGNOSIS — Z9889 Other specified postprocedural states: Secondary | ICD-10-CM | POA: Diagnosis not present

## 2016-10-16 DIAGNOSIS — Z89622 Acquired absence of left hip joint: Secondary | ICD-10-CM | POA: Diagnosis not present

## 2016-10-19 ENCOUNTER — Ambulatory Visit (INDEPENDENT_AMBULATORY_CARE_PROVIDER_SITE_OTHER): Payer: Medicare Other | Admitting: Urology

## 2016-10-19 DIAGNOSIS — R338 Other retention of urine: Secondary | ICD-10-CM | POA: Diagnosis not present

## 2016-11-12 DIAGNOSIS — L308 Other specified dermatitis: Secondary | ICD-10-CM | POA: Diagnosis not present

## 2016-11-12 DIAGNOSIS — L821 Other seborrheic keratosis: Secondary | ICD-10-CM | POA: Diagnosis not present

## 2016-12-03 DIAGNOSIS — I10 Essential (primary) hypertension: Secondary | ICD-10-CM | POA: Diagnosis not present

## 2016-12-03 DIAGNOSIS — D649 Anemia, unspecified: Secondary | ICD-10-CM | POA: Diagnosis not present

## 2016-12-06 DIAGNOSIS — M4800 Spinal stenosis, site unspecified: Secondary | ICD-10-CM | POA: Diagnosis not present

## 2016-12-06 DIAGNOSIS — E039 Hypothyroidism, unspecified: Secondary | ICD-10-CM | POA: Diagnosis not present

## 2016-12-06 DIAGNOSIS — E785 Hyperlipidemia, unspecified: Secondary | ICD-10-CM | POA: Diagnosis not present

## 2016-12-06 DIAGNOSIS — F329 Major depressive disorder, single episode, unspecified: Secondary | ICD-10-CM | POA: Diagnosis not present

## 2016-12-31 DIAGNOSIS — D649 Anemia, unspecified: Secondary | ICD-10-CM | POA: Diagnosis not present

## 2016-12-31 DIAGNOSIS — I10 Essential (primary) hypertension: Secondary | ICD-10-CM | POA: Diagnosis not present

## 2017-01-01 DIAGNOSIS — M79674 Pain in right toe(s): Secondary | ICD-10-CM | POA: Diagnosis not present

## 2017-01-01 DIAGNOSIS — M79675 Pain in left toe(s): Secondary | ICD-10-CM | POA: Diagnosis not present

## 2017-01-01 DIAGNOSIS — B351 Tinea unguium: Secondary | ICD-10-CM | POA: Diagnosis not present

## 2017-01-29 DIAGNOSIS — M4800 Spinal stenosis, site unspecified: Secondary | ICD-10-CM | POA: Diagnosis not present

## 2017-01-29 DIAGNOSIS — F329 Major depressive disorder, single episode, unspecified: Secondary | ICD-10-CM | POA: Diagnosis not present

## 2017-01-29 DIAGNOSIS — E785 Hyperlipidemia, unspecified: Secondary | ICD-10-CM | POA: Diagnosis not present

## 2017-01-29 DIAGNOSIS — A809 Acute poliomyelitis, unspecified: Secondary | ICD-10-CM | POA: Diagnosis not present

## 2017-02-11 DIAGNOSIS — Z5181 Encounter for therapeutic drug level monitoring: Secondary | ICD-10-CM | POA: Diagnosis not present

## 2017-02-11 DIAGNOSIS — D649 Anemia, unspecified: Secondary | ICD-10-CM | POA: Diagnosis not present

## 2017-02-27 DIAGNOSIS — M6281 Muscle weakness (generalized): Secondary | ICD-10-CM | POA: Diagnosis not present

## 2017-02-27 DIAGNOSIS — Z89622 Acquired absence of left hip joint: Secondary | ICD-10-CM | POA: Diagnosis not present

## 2017-02-27 DIAGNOSIS — R1310 Dysphagia, unspecified: Secondary | ICD-10-CM | POA: Diagnosis not present

## 2017-02-27 DIAGNOSIS — G9340 Encephalopathy, unspecified: Secondary | ICD-10-CM | POA: Diagnosis not present

## 2017-02-27 DIAGNOSIS — R41841 Cognitive communication deficit: Secondary | ICD-10-CM | POA: Diagnosis not present

## 2017-02-27 DIAGNOSIS — Z96642 Presence of left artificial hip joint: Secondary | ICD-10-CM | POA: Diagnosis not present

## 2017-02-28 DIAGNOSIS — Z96642 Presence of left artificial hip joint: Secondary | ICD-10-CM | POA: Diagnosis not present

## 2017-02-28 DIAGNOSIS — M6281 Muscle weakness (generalized): Secondary | ICD-10-CM | POA: Diagnosis not present

## 2017-02-28 DIAGNOSIS — R1310 Dysphagia, unspecified: Secondary | ICD-10-CM | POA: Diagnosis not present

## 2017-02-28 DIAGNOSIS — R41841 Cognitive communication deficit: Secondary | ICD-10-CM | POA: Diagnosis not present

## 2017-02-28 DIAGNOSIS — G9340 Encephalopathy, unspecified: Secondary | ICD-10-CM | POA: Diagnosis not present

## 2017-02-28 DIAGNOSIS — Z89622 Acquired absence of left hip joint: Secondary | ICD-10-CM | POA: Diagnosis not present

## 2017-03-01 DIAGNOSIS — R1310 Dysphagia, unspecified: Secondary | ICD-10-CM | POA: Diagnosis not present

## 2017-03-01 DIAGNOSIS — G9340 Encephalopathy, unspecified: Secondary | ICD-10-CM | POA: Diagnosis not present

## 2017-03-01 DIAGNOSIS — D649 Anemia, unspecified: Secondary | ICD-10-CM | POA: Diagnosis not present

## 2017-03-01 DIAGNOSIS — R41841 Cognitive communication deficit: Secondary | ICD-10-CM | POA: Diagnosis not present

## 2017-03-01 DIAGNOSIS — Z96642 Presence of left artificial hip joint: Secondary | ICD-10-CM | POA: Diagnosis not present

## 2017-03-01 DIAGNOSIS — Z5181 Encounter for therapeutic drug level monitoring: Secondary | ICD-10-CM | POA: Diagnosis not present

## 2017-03-01 DIAGNOSIS — M6281 Muscle weakness (generalized): Secondary | ICD-10-CM | POA: Diagnosis not present

## 2017-03-01 DIAGNOSIS — Z89622 Acquired absence of left hip joint: Secondary | ICD-10-CM | POA: Diagnosis not present

## 2017-03-04 DIAGNOSIS — M6281 Muscle weakness (generalized): Secondary | ICD-10-CM | POA: Diagnosis not present

## 2017-03-04 DIAGNOSIS — Z89622 Acquired absence of left hip joint: Secondary | ICD-10-CM | POA: Diagnosis not present

## 2017-03-04 DIAGNOSIS — R41841 Cognitive communication deficit: Secondary | ICD-10-CM | POA: Diagnosis not present

## 2017-03-04 DIAGNOSIS — E875 Hyperkalemia: Secondary | ICD-10-CM | POA: Diagnosis not present

## 2017-03-04 DIAGNOSIS — G9340 Encephalopathy, unspecified: Secondary | ICD-10-CM | POA: Diagnosis not present

## 2017-03-04 DIAGNOSIS — D649 Anemia, unspecified: Secondary | ICD-10-CM | POA: Diagnosis not present

## 2017-03-04 DIAGNOSIS — Z96642 Presence of left artificial hip joint: Secondary | ICD-10-CM | POA: Diagnosis not present

## 2017-03-04 DIAGNOSIS — R1310 Dysphagia, unspecified: Secondary | ICD-10-CM | POA: Diagnosis not present

## 2017-03-05 DIAGNOSIS — Z89622 Acquired absence of left hip joint: Secondary | ICD-10-CM | POA: Diagnosis not present

## 2017-03-05 DIAGNOSIS — M6281 Muscle weakness (generalized): Secondary | ICD-10-CM | POA: Diagnosis not present

## 2017-03-05 DIAGNOSIS — G9340 Encephalopathy, unspecified: Secondary | ICD-10-CM | POA: Diagnosis not present

## 2017-03-05 DIAGNOSIS — R41841 Cognitive communication deficit: Secondary | ICD-10-CM | POA: Diagnosis not present

## 2017-03-05 DIAGNOSIS — R1310 Dysphagia, unspecified: Secondary | ICD-10-CM | POA: Diagnosis not present

## 2017-03-05 DIAGNOSIS — Z96642 Presence of left artificial hip joint: Secondary | ICD-10-CM | POA: Diagnosis not present

## 2017-03-06 DIAGNOSIS — R1310 Dysphagia, unspecified: Secondary | ICD-10-CM | POA: Diagnosis not present

## 2017-03-06 DIAGNOSIS — M6281 Muscle weakness (generalized): Secondary | ICD-10-CM | POA: Diagnosis not present

## 2017-03-06 DIAGNOSIS — R41841 Cognitive communication deficit: Secondary | ICD-10-CM | POA: Diagnosis not present

## 2017-03-06 DIAGNOSIS — Z89622 Acquired absence of left hip joint: Secondary | ICD-10-CM | POA: Diagnosis not present

## 2017-03-06 DIAGNOSIS — Z96642 Presence of left artificial hip joint: Secondary | ICD-10-CM | POA: Diagnosis not present

## 2017-03-06 DIAGNOSIS — G9340 Encephalopathy, unspecified: Secondary | ICD-10-CM | POA: Diagnosis not present

## 2017-03-07 DIAGNOSIS — Z96642 Presence of left artificial hip joint: Secondary | ICD-10-CM | POA: Diagnosis not present

## 2017-03-07 DIAGNOSIS — R1310 Dysphagia, unspecified: Secondary | ICD-10-CM | POA: Diagnosis not present

## 2017-03-07 DIAGNOSIS — G9340 Encephalopathy, unspecified: Secondary | ICD-10-CM | POA: Diagnosis not present

## 2017-03-07 DIAGNOSIS — Z89622 Acquired absence of left hip joint: Secondary | ICD-10-CM | POA: Diagnosis not present

## 2017-03-07 DIAGNOSIS — M6281 Muscle weakness (generalized): Secondary | ICD-10-CM | POA: Diagnosis not present

## 2017-03-07 DIAGNOSIS — R41841 Cognitive communication deficit: Secondary | ICD-10-CM | POA: Diagnosis not present

## 2017-03-08 DIAGNOSIS — M6281 Muscle weakness (generalized): Secondary | ICD-10-CM | POA: Diagnosis not present

## 2017-03-08 DIAGNOSIS — Z89622 Acquired absence of left hip joint: Secondary | ICD-10-CM | POA: Diagnosis not present

## 2017-03-08 DIAGNOSIS — R41841 Cognitive communication deficit: Secondary | ICD-10-CM | POA: Diagnosis not present

## 2017-03-08 DIAGNOSIS — G9340 Encephalopathy, unspecified: Secondary | ICD-10-CM | POA: Diagnosis not present

## 2017-03-08 DIAGNOSIS — Z96642 Presence of left artificial hip joint: Secondary | ICD-10-CM | POA: Diagnosis not present

## 2017-03-08 DIAGNOSIS — R1310 Dysphagia, unspecified: Secondary | ICD-10-CM | POA: Diagnosis not present

## 2017-03-10 DIAGNOSIS — Z96642 Presence of left artificial hip joint: Secondary | ICD-10-CM | POA: Diagnosis not present

## 2017-03-10 DIAGNOSIS — Z89622 Acquired absence of left hip joint: Secondary | ICD-10-CM | POA: Diagnosis not present

## 2017-03-10 DIAGNOSIS — R41841 Cognitive communication deficit: Secondary | ICD-10-CM | POA: Diagnosis not present

## 2017-03-10 DIAGNOSIS — M6281 Muscle weakness (generalized): Secondary | ICD-10-CM | POA: Diagnosis not present

## 2017-03-10 DIAGNOSIS — G9340 Encephalopathy, unspecified: Secondary | ICD-10-CM | POA: Diagnosis not present

## 2017-03-10 DIAGNOSIS — R1310 Dysphagia, unspecified: Secondary | ICD-10-CM | POA: Diagnosis not present

## 2017-03-11 DIAGNOSIS — M6281 Muscle weakness (generalized): Secondary | ICD-10-CM | POA: Diagnosis not present

## 2017-03-11 DIAGNOSIS — G9340 Encephalopathy, unspecified: Secondary | ICD-10-CM | POA: Diagnosis not present

## 2017-03-11 DIAGNOSIS — Z89622 Acquired absence of left hip joint: Secondary | ICD-10-CM | POA: Diagnosis not present

## 2017-03-11 DIAGNOSIS — R1310 Dysphagia, unspecified: Secondary | ICD-10-CM | POA: Diagnosis not present

## 2017-03-11 DIAGNOSIS — R41841 Cognitive communication deficit: Secondary | ICD-10-CM | POA: Diagnosis not present

## 2017-03-11 DIAGNOSIS — Z96642 Presence of left artificial hip joint: Secondary | ICD-10-CM | POA: Diagnosis not present

## 2017-03-13 DIAGNOSIS — Z89622 Acquired absence of left hip joint: Secondary | ICD-10-CM | POA: Diagnosis not present

## 2017-03-13 DIAGNOSIS — Z5181 Encounter for therapeutic drug level monitoring: Secondary | ICD-10-CM | POA: Diagnosis not present

## 2017-03-13 DIAGNOSIS — D649 Anemia, unspecified: Secondary | ICD-10-CM | POA: Diagnosis not present

## 2017-03-13 DIAGNOSIS — Z96642 Presence of left artificial hip joint: Secondary | ICD-10-CM | POA: Diagnosis not present

## 2017-03-13 DIAGNOSIS — R1310 Dysphagia, unspecified: Secondary | ICD-10-CM | POA: Diagnosis not present

## 2017-03-13 DIAGNOSIS — M6281 Muscle weakness (generalized): Secondary | ICD-10-CM | POA: Diagnosis not present

## 2017-03-13 DIAGNOSIS — R41841 Cognitive communication deficit: Secondary | ICD-10-CM | POA: Diagnosis not present

## 2017-03-13 DIAGNOSIS — G9340 Encephalopathy, unspecified: Secondary | ICD-10-CM | POA: Diagnosis not present

## 2017-03-14 DIAGNOSIS — G9340 Encephalopathy, unspecified: Secondary | ICD-10-CM | POA: Diagnosis not present

## 2017-03-14 DIAGNOSIS — Z89622 Acquired absence of left hip joint: Secondary | ICD-10-CM | POA: Diagnosis not present

## 2017-03-14 DIAGNOSIS — Z96642 Presence of left artificial hip joint: Secondary | ICD-10-CM | POA: Diagnosis not present

## 2017-03-14 DIAGNOSIS — M6281 Muscle weakness (generalized): Secondary | ICD-10-CM | POA: Diagnosis not present

## 2017-03-14 DIAGNOSIS — R1310 Dysphagia, unspecified: Secondary | ICD-10-CM | POA: Diagnosis not present

## 2017-03-14 DIAGNOSIS — R41841 Cognitive communication deficit: Secondary | ICD-10-CM | POA: Diagnosis not present

## 2017-03-15 DIAGNOSIS — Z89622 Acquired absence of left hip joint: Secondary | ICD-10-CM | POA: Diagnosis not present

## 2017-03-15 DIAGNOSIS — R1310 Dysphagia, unspecified: Secondary | ICD-10-CM | POA: Diagnosis not present

## 2017-03-15 DIAGNOSIS — R41841 Cognitive communication deficit: Secondary | ICD-10-CM | POA: Diagnosis not present

## 2017-03-15 DIAGNOSIS — M6281 Muscle weakness (generalized): Secondary | ICD-10-CM | POA: Diagnosis not present

## 2017-03-15 DIAGNOSIS — Z96642 Presence of left artificial hip joint: Secondary | ICD-10-CM | POA: Diagnosis not present

## 2017-03-15 DIAGNOSIS — G9340 Encephalopathy, unspecified: Secondary | ICD-10-CM | POA: Diagnosis not present

## 2017-03-18 DIAGNOSIS — M6281 Muscle weakness (generalized): Secondary | ICD-10-CM | POA: Diagnosis not present

## 2017-03-18 DIAGNOSIS — Z96642 Presence of left artificial hip joint: Secondary | ICD-10-CM | POA: Diagnosis not present

## 2017-03-18 DIAGNOSIS — G9340 Encephalopathy, unspecified: Secondary | ICD-10-CM | POA: Diagnosis not present

## 2017-03-18 DIAGNOSIS — R1310 Dysphagia, unspecified: Secondary | ICD-10-CM | POA: Diagnosis not present

## 2017-03-18 DIAGNOSIS — Z89622 Acquired absence of left hip joint: Secondary | ICD-10-CM | POA: Diagnosis not present

## 2017-03-18 DIAGNOSIS — R41841 Cognitive communication deficit: Secondary | ICD-10-CM | POA: Diagnosis not present

## 2017-03-19 DIAGNOSIS — G9340 Encephalopathy, unspecified: Secondary | ICD-10-CM | POA: Diagnosis not present

## 2017-03-19 DIAGNOSIS — M6281 Muscle weakness (generalized): Secondary | ICD-10-CM | POA: Diagnosis not present

## 2017-03-19 DIAGNOSIS — Z89622 Acquired absence of left hip joint: Secondary | ICD-10-CM | POA: Diagnosis not present

## 2017-03-20 DIAGNOSIS — M6281 Muscle weakness (generalized): Secondary | ICD-10-CM | POA: Diagnosis not present

## 2017-03-20 DIAGNOSIS — G9340 Encephalopathy, unspecified: Secondary | ICD-10-CM | POA: Diagnosis not present

## 2017-03-20 DIAGNOSIS — Z89622 Acquired absence of left hip joint: Secondary | ICD-10-CM | POA: Diagnosis not present

## 2017-03-21 DIAGNOSIS — L600 Ingrowing nail: Secondary | ICD-10-CM | POA: Diagnosis not present

## 2017-03-21 DIAGNOSIS — M79674 Pain in right toe(s): Secondary | ICD-10-CM | POA: Diagnosis not present

## 2017-03-21 DIAGNOSIS — M6281 Muscle weakness (generalized): Secondary | ICD-10-CM | POA: Diagnosis not present

## 2017-03-21 DIAGNOSIS — Z89622 Acquired absence of left hip joint: Secondary | ICD-10-CM | POA: Diagnosis not present

## 2017-03-21 DIAGNOSIS — G9340 Encephalopathy, unspecified: Secondary | ICD-10-CM | POA: Diagnosis not present

## 2017-03-22 DIAGNOSIS — G9340 Encephalopathy, unspecified: Secondary | ICD-10-CM | POA: Diagnosis not present

## 2017-03-22 DIAGNOSIS — M6281 Muscle weakness (generalized): Secondary | ICD-10-CM | POA: Diagnosis not present

## 2017-03-22 DIAGNOSIS — Z89622 Acquired absence of left hip joint: Secondary | ICD-10-CM | POA: Diagnosis not present

## 2017-03-25 DIAGNOSIS — M6281 Muscle weakness (generalized): Secondary | ICD-10-CM | POA: Diagnosis not present

## 2017-03-25 DIAGNOSIS — Z89622 Acquired absence of left hip joint: Secondary | ICD-10-CM | POA: Diagnosis not present

## 2017-03-25 DIAGNOSIS — G9340 Encephalopathy, unspecified: Secondary | ICD-10-CM | POA: Diagnosis not present

## 2017-03-26 DIAGNOSIS — E785 Hyperlipidemia, unspecified: Secondary | ICD-10-CM | POA: Diagnosis not present

## 2017-03-26 DIAGNOSIS — F329 Major depressive disorder, single episode, unspecified: Secondary | ICD-10-CM | POA: Diagnosis not present

## 2017-03-26 DIAGNOSIS — M6281 Muscle weakness (generalized): Secondary | ICD-10-CM | POA: Diagnosis not present

## 2017-03-26 DIAGNOSIS — D649 Anemia, unspecified: Secondary | ICD-10-CM | POA: Diagnosis not present

## 2017-03-26 DIAGNOSIS — Z89622 Acquired absence of left hip joint: Secondary | ICD-10-CM | POA: Diagnosis not present

## 2017-03-26 DIAGNOSIS — G9340 Encephalopathy, unspecified: Secondary | ICD-10-CM | POA: Diagnosis not present

## 2017-03-26 DIAGNOSIS — M4800 Spinal stenosis, site unspecified: Secondary | ICD-10-CM | POA: Diagnosis not present

## 2017-03-27 DIAGNOSIS — Z89622 Acquired absence of left hip joint: Secondary | ICD-10-CM | POA: Diagnosis not present

## 2017-03-27 DIAGNOSIS — M6281 Muscle weakness (generalized): Secondary | ICD-10-CM | POA: Diagnosis not present

## 2017-03-27 DIAGNOSIS — G9340 Encephalopathy, unspecified: Secondary | ICD-10-CM | POA: Diagnosis not present

## 2017-03-28 DIAGNOSIS — M6281 Muscle weakness (generalized): Secondary | ICD-10-CM | POA: Diagnosis not present

## 2017-03-28 DIAGNOSIS — G9340 Encephalopathy, unspecified: Secondary | ICD-10-CM | POA: Diagnosis not present

## 2017-03-28 DIAGNOSIS — Z89622 Acquired absence of left hip joint: Secondary | ICD-10-CM | POA: Diagnosis not present

## 2017-03-29 DIAGNOSIS — M6281 Muscle weakness (generalized): Secondary | ICD-10-CM | POA: Diagnosis not present

## 2017-03-29 DIAGNOSIS — Z89622 Acquired absence of left hip joint: Secondary | ICD-10-CM | POA: Diagnosis not present

## 2017-03-29 DIAGNOSIS — G9340 Encephalopathy, unspecified: Secondary | ICD-10-CM | POA: Diagnosis not present

## 2017-04-01 DIAGNOSIS — Z89622 Acquired absence of left hip joint: Secondary | ICD-10-CM | POA: Diagnosis not present

## 2017-04-01 DIAGNOSIS — M6281 Muscle weakness (generalized): Secondary | ICD-10-CM | POA: Diagnosis not present

## 2017-04-01 DIAGNOSIS — G9340 Encephalopathy, unspecified: Secondary | ICD-10-CM | POA: Diagnosis not present

## 2017-04-02 DIAGNOSIS — M6281 Muscle weakness (generalized): Secondary | ICD-10-CM | POA: Diagnosis not present

## 2017-04-02 DIAGNOSIS — Z89622 Acquired absence of left hip joint: Secondary | ICD-10-CM | POA: Diagnosis not present

## 2017-04-02 DIAGNOSIS — G9340 Encephalopathy, unspecified: Secondary | ICD-10-CM | POA: Diagnosis not present

## 2017-04-03 DIAGNOSIS — G9340 Encephalopathy, unspecified: Secondary | ICD-10-CM | POA: Diagnosis not present

## 2017-04-03 DIAGNOSIS — Z89622 Acquired absence of left hip joint: Secondary | ICD-10-CM | POA: Diagnosis not present

## 2017-04-03 DIAGNOSIS — M6281 Muscle weakness (generalized): Secondary | ICD-10-CM | POA: Diagnosis not present

## 2017-04-04 DIAGNOSIS — Z89622 Acquired absence of left hip joint: Secondary | ICD-10-CM | POA: Diagnosis not present

## 2017-04-04 DIAGNOSIS — G9340 Encephalopathy, unspecified: Secondary | ICD-10-CM | POA: Diagnosis not present

## 2017-04-04 DIAGNOSIS — M6281 Muscle weakness (generalized): Secondary | ICD-10-CM | POA: Diagnosis not present

## 2017-04-05 DIAGNOSIS — M6281 Muscle weakness (generalized): Secondary | ICD-10-CM | POA: Diagnosis not present

## 2017-04-05 DIAGNOSIS — G9340 Encephalopathy, unspecified: Secondary | ICD-10-CM | POA: Diagnosis not present

## 2017-04-05 DIAGNOSIS — Z89622 Acquired absence of left hip joint: Secondary | ICD-10-CM | POA: Diagnosis not present

## 2017-04-08 DIAGNOSIS — Z89622 Acquired absence of left hip joint: Secondary | ICD-10-CM | POA: Diagnosis not present

## 2017-04-08 DIAGNOSIS — G9340 Encephalopathy, unspecified: Secondary | ICD-10-CM | POA: Diagnosis not present

## 2017-04-08 DIAGNOSIS — M6281 Muscle weakness (generalized): Secondary | ICD-10-CM | POA: Diagnosis not present

## 2017-04-09 DIAGNOSIS — G9340 Encephalopathy, unspecified: Secondary | ICD-10-CM | POA: Diagnosis not present

## 2017-04-09 DIAGNOSIS — Z89622 Acquired absence of left hip joint: Secondary | ICD-10-CM | POA: Diagnosis not present

## 2017-04-09 DIAGNOSIS — M6281 Muscle weakness (generalized): Secondary | ICD-10-CM | POA: Diagnosis not present

## 2017-04-10 DIAGNOSIS — Z89622 Acquired absence of left hip joint: Secondary | ICD-10-CM | POA: Diagnosis not present

## 2017-04-10 DIAGNOSIS — G9340 Encephalopathy, unspecified: Secondary | ICD-10-CM | POA: Diagnosis not present

## 2017-04-10 DIAGNOSIS — M6281 Muscle weakness (generalized): Secondary | ICD-10-CM | POA: Diagnosis not present

## 2017-04-11 DIAGNOSIS — Z89622 Acquired absence of left hip joint: Secondary | ICD-10-CM | POA: Diagnosis not present

## 2017-04-11 DIAGNOSIS — G9340 Encephalopathy, unspecified: Secondary | ICD-10-CM | POA: Diagnosis not present

## 2017-04-11 DIAGNOSIS — M6281 Muscle weakness (generalized): Secondary | ICD-10-CM | POA: Diagnosis not present

## 2017-04-12 DIAGNOSIS — M6281 Muscle weakness (generalized): Secondary | ICD-10-CM | POA: Diagnosis not present

## 2017-04-12 DIAGNOSIS — G9340 Encephalopathy, unspecified: Secondary | ICD-10-CM | POA: Diagnosis not present

## 2017-04-12 DIAGNOSIS — Z89622 Acquired absence of left hip joint: Secondary | ICD-10-CM | POA: Diagnosis not present

## 2017-04-15 DIAGNOSIS — G9340 Encephalopathy, unspecified: Secondary | ICD-10-CM | POA: Diagnosis not present

## 2017-04-15 DIAGNOSIS — Z89622 Acquired absence of left hip joint: Secondary | ICD-10-CM | POA: Diagnosis not present

## 2017-04-15 DIAGNOSIS — M6281 Muscle weakness (generalized): Secondary | ICD-10-CM | POA: Diagnosis not present

## 2017-04-16 DIAGNOSIS — Z89622 Acquired absence of left hip joint: Secondary | ICD-10-CM | POA: Diagnosis not present

## 2017-04-16 DIAGNOSIS — M6281 Muscle weakness (generalized): Secondary | ICD-10-CM | POA: Diagnosis not present

## 2017-04-16 DIAGNOSIS — G9340 Encephalopathy, unspecified: Secondary | ICD-10-CM | POA: Diagnosis not present

## 2017-04-17 DIAGNOSIS — Z89622 Acquired absence of left hip joint: Secondary | ICD-10-CM | POA: Diagnosis not present

## 2017-04-17 DIAGNOSIS — M6281 Muscle weakness (generalized): Secondary | ICD-10-CM | POA: Diagnosis not present

## 2017-04-17 DIAGNOSIS — G9340 Encephalopathy, unspecified: Secondary | ICD-10-CM | POA: Diagnosis not present

## 2017-04-18 DIAGNOSIS — D649 Anemia, unspecified: Secondary | ICD-10-CM | POA: Diagnosis not present

## 2017-04-18 DIAGNOSIS — G9340 Encephalopathy, unspecified: Secondary | ICD-10-CM | POA: Diagnosis not present

## 2017-04-18 DIAGNOSIS — M6281 Muscle weakness (generalized): Secondary | ICD-10-CM | POA: Diagnosis not present

## 2017-04-18 DIAGNOSIS — Z89622 Acquired absence of left hip joint: Secondary | ICD-10-CM | POA: Diagnosis not present

## 2017-04-18 DIAGNOSIS — Z5181 Encounter for therapeutic drug level monitoring: Secondary | ICD-10-CM | POA: Diagnosis not present

## 2017-04-19 DIAGNOSIS — M6281 Muscle weakness (generalized): Secondary | ICD-10-CM | POA: Diagnosis not present

## 2017-04-19 DIAGNOSIS — G9340 Encephalopathy, unspecified: Secondary | ICD-10-CM | POA: Diagnosis not present

## 2017-04-19 DIAGNOSIS — Z89622 Acquired absence of left hip joint: Secondary | ICD-10-CM | POA: Diagnosis not present

## 2017-04-22 DIAGNOSIS — Z89622 Acquired absence of left hip joint: Secondary | ICD-10-CM | POA: Diagnosis not present

## 2017-04-22 DIAGNOSIS — M6281 Muscle weakness (generalized): Secondary | ICD-10-CM | POA: Diagnosis not present

## 2017-04-22 DIAGNOSIS — G9340 Encephalopathy, unspecified: Secondary | ICD-10-CM | POA: Diagnosis not present

## 2017-04-23 DIAGNOSIS — G9340 Encephalopathy, unspecified: Secondary | ICD-10-CM | POA: Diagnosis not present

## 2017-04-23 DIAGNOSIS — Z89622 Acquired absence of left hip joint: Secondary | ICD-10-CM | POA: Diagnosis not present

## 2017-04-23 DIAGNOSIS — M6281 Muscle weakness (generalized): Secondary | ICD-10-CM | POA: Diagnosis not present

## 2017-04-24 DIAGNOSIS — Z89622 Acquired absence of left hip joint: Secondary | ICD-10-CM | POA: Diagnosis not present

## 2017-04-24 DIAGNOSIS — M6281 Muscle weakness (generalized): Secondary | ICD-10-CM | POA: Diagnosis not present

## 2017-04-24 DIAGNOSIS — G9340 Encephalopathy, unspecified: Secondary | ICD-10-CM | POA: Diagnosis not present

## 2017-04-25 DIAGNOSIS — Z89622 Acquired absence of left hip joint: Secondary | ICD-10-CM | POA: Diagnosis not present

## 2017-04-25 DIAGNOSIS — G9340 Encephalopathy, unspecified: Secondary | ICD-10-CM | POA: Diagnosis not present

## 2017-04-25 DIAGNOSIS — M6281 Muscle weakness (generalized): Secondary | ICD-10-CM | POA: Diagnosis not present

## 2017-04-26 DIAGNOSIS — G9340 Encephalopathy, unspecified: Secondary | ICD-10-CM | POA: Diagnosis not present

## 2017-04-26 DIAGNOSIS — M6281 Muscle weakness (generalized): Secondary | ICD-10-CM | POA: Diagnosis not present

## 2017-04-26 DIAGNOSIS — Z89622 Acquired absence of left hip joint: Secondary | ICD-10-CM | POA: Diagnosis not present

## 2017-04-29 DIAGNOSIS — G9340 Encephalopathy, unspecified: Secondary | ICD-10-CM | POA: Diagnosis not present

## 2017-04-29 DIAGNOSIS — Z89622 Acquired absence of left hip joint: Secondary | ICD-10-CM | POA: Diagnosis not present

## 2017-04-29 DIAGNOSIS — M6281 Muscle weakness (generalized): Secondary | ICD-10-CM | POA: Diagnosis not present

## 2017-04-30 DIAGNOSIS — G9340 Encephalopathy, unspecified: Secondary | ICD-10-CM | POA: Diagnosis not present

## 2017-04-30 DIAGNOSIS — M6281 Muscle weakness (generalized): Secondary | ICD-10-CM | POA: Diagnosis not present

## 2017-04-30 DIAGNOSIS — Z89622 Acquired absence of left hip joint: Secondary | ICD-10-CM | POA: Diagnosis not present

## 2017-05-01 DIAGNOSIS — G9340 Encephalopathy, unspecified: Secondary | ICD-10-CM | POA: Diagnosis not present

## 2017-05-01 DIAGNOSIS — Z89622 Acquired absence of left hip joint: Secondary | ICD-10-CM | POA: Diagnosis not present

## 2017-05-01 DIAGNOSIS — M6281 Muscle weakness (generalized): Secondary | ICD-10-CM | POA: Diagnosis not present

## 2017-05-02 DIAGNOSIS — M6281 Muscle weakness (generalized): Secondary | ICD-10-CM | POA: Diagnosis not present

## 2017-05-02 DIAGNOSIS — Z89622 Acquired absence of left hip joint: Secondary | ICD-10-CM | POA: Diagnosis not present

## 2017-05-02 DIAGNOSIS — G9340 Encephalopathy, unspecified: Secondary | ICD-10-CM | POA: Diagnosis not present

## 2017-05-03 DIAGNOSIS — M6281 Muscle weakness (generalized): Secondary | ICD-10-CM | POA: Diagnosis not present

## 2017-05-03 DIAGNOSIS — Z89622 Acquired absence of left hip joint: Secondary | ICD-10-CM | POA: Diagnosis not present

## 2017-05-03 DIAGNOSIS — G9340 Encephalopathy, unspecified: Secondary | ICD-10-CM | POA: Diagnosis not present

## 2017-05-06 DIAGNOSIS — M6281 Muscle weakness (generalized): Secondary | ICD-10-CM | POA: Diagnosis not present

## 2017-05-06 DIAGNOSIS — G9340 Encephalopathy, unspecified: Secondary | ICD-10-CM | POA: Diagnosis not present

## 2017-05-06 DIAGNOSIS — Z89622 Acquired absence of left hip joint: Secondary | ICD-10-CM | POA: Diagnosis not present

## 2017-05-07 DIAGNOSIS — M6281 Muscle weakness (generalized): Secondary | ICD-10-CM | POA: Diagnosis not present

## 2017-05-07 DIAGNOSIS — Z89622 Acquired absence of left hip joint: Secondary | ICD-10-CM | POA: Diagnosis not present

## 2017-05-07 DIAGNOSIS — G9340 Encephalopathy, unspecified: Secondary | ICD-10-CM | POA: Diagnosis not present

## 2017-05-08 DIAGNOSIS — G9340 Encephalopathy, unspecified: Secondary | ICD-10-CM | POA: Diagnosis not present

## 2017-05-08 DIAGNOSIS — M6281 Muscle weakness (generalized): Secondary | ICD-10-CM | POA: Diagnosis not present

## 2017-05-08 DIAGNOSIS — Z89622 Acquired absence of left hip joint: Secondary | ICD-10-CM | POA: Diagnosis not present

## 2017-05-09 DIAGNOSIS — Z89622 Acquired absence of left hip joint: Secondary | ICD-10-CM | POA: Diagnosis not present

## 2017-05-09 DIAGNOSIS — M6281 Muscle weakness (generalized): Secondary | ICD-10-CM | POA: Diagnosis not present

## 2017-05-09 DIAGNOSIS — G9340 Encephalopathy, unspecified: Secondary | ICD-10-CM | POA: Diagnosis not present

## 2017-05-10 DIAGNOSIS — Z89622 Acquired absence of left hip joint: Secondary | ICD-10-CM | POA: Diagnosis not present

## 2017-05-10 DIAGNOSIS — G9340 Encephalopathy, unspecified: Secondary | ICD-10-CM | POA: Diagnosis not present

## 2017-05-10 DIAGNOSIS — M6281 Muscle weakness (generalized): Secondary | ICD-10-CM | POA: Diagnosis not present

## 2017-05-11 DIAGNOSIS — D649 Anemia, unspecified: Secondary | ICD-10-CM | POA: Diagnosis not present

## 2017-05-11 DIAGNOSIS — R319 Hematuria, unspecified: Secondary | ICD-10-CM | POA: Diagnosis not present

## 2017-05-11 DIAGNOSIS — R509 Fever, unspecified: Secondary | ICD-10-CM | POA: Diagnosis not present

## 2017-05-11 DIAGNOSIS — N390 Urinary tract infection, site not specified: Secondary | ICD-10-CM | POA: Diagnosis not present

## 2017-05-13 DIAGNOSIS — M6281 Muscle weakness (generalized): Secondary | ICD-10-CM | POA: Diagnosis not present

## 2017-05-13 DIAGNOSIS — G9340 Encephalopathy, unspecified: Secondary | ICD-10-CM | POA: Diagnosis not present

## 2017-05-13 DIAGNOSIS — Z89622 Acquired absence of left hip joint: Secondary | ICD-10-CM | POA: Diagnosis not present

## 2017-05-14 DIAGNOSIS — M6281 Muscle weakness (generalized): Secondary | ICD-10-CM | POA: Diagnosis not present

## 2017-05-14 DIAGNOSIS — Z89622 Acquired absence of left hip joint: Secondary | ICD-10-CM | POA: Diagnosis not present

## 2017-05-14 DIAGNOSIS — G9340 Encephalopathy, unspecified: Secondary | ICD-10-CM | POA: Diagnosis not present

## 2017-05-15 DIAGNOSIS — M4800 Spinal stenosis, site unspecified: Secondary | ICD-10-CM | POA: Diagnosis not present

## 2017-05-15 DIAGNOSIS — J449 Chronic obstructive pulmonary disease, unspecified: Secondary | ICD-10-CM | POA: Diagnosis not present

## 2017-05-15 DIAGNOSIS — G9340 Encephalopathy, unspecified: Secondary | ICD-10-CM | POA: Diagnosis not present

## 2017-05-15 DIAGNOSIS — Z89622 Acquired absence of left hip joint: Secondary | ICD-10-CM | POA: Diagnosis not present

## 2017-05-15 DIAGNOSIS — M6281 Muscle weakness (generalized): Secondary | ICD-10-CM | POA: Diagnosis not present

## 2017-05-15 DIAGNOSIS — E039 Hypothyroidism, unspecified: Secondary | ICD-10-CM | POA: Diagnosis not present

## 2017-05-16 DIAGNOSIS — D649 Anemia, unspecified: Secondary | ICD-10-CM | POA: Diagnosis not present

## 2017-05-16 DIAGNOSIS — G9340 Encephalopathy, unspecified: Secondary | ICD-10-CM | POA: Diagnosis not present

## 2017-05-16 DIAGNOSIS — Z5181 Encounter for therapeutic drug level monitoring: Secondary | ICD-10-CM | POA: Diagnosis not present

## 2017-05-16 DIAGNOSIS — Z89622 Acquired absence of left hip joint: Secondary | ICD-10-CM | POA: Diagnosis not present

## 2017-05-16 DIAGNOSIS — M6281 Muscle weakness (generalized): Secondary | ICD-10-CM | POA: Diagnosis not present

## 2017-05-17 DIAGNOSIS — M6281 Muscle weakness (generalized): Secondary | ICD-10-CM | POA: Diagnosis not present

## 2017-05-17 DIAGNOSIS — Z89622 Acquired absence of left hip joint: Secondary | ICD-10-CM | POA: Diagnosis not present

## 2017-05-17 DIAGNOSIS — G9340 Encephalopathy, unspecified: Secondary | ICD-10-CM | POA: Diagnosis not present

## 2017-05-20 DIAGNOSIS — G9340 Encephalopathy, unspecified: Secondary | ICD-10-CM | POA: Diagnosis not present

## 2017-05-20 DIAGNOSIS — M6281 Muscle weakness (generalized): Secondary | ICD-10-CM | POA: Diagnosis not present

## 2017-05-20 DIAGNOSIS — Z89622 Acquired absence of left hip joint: Secondary | ICD-10-CM | POA: Diagnosis not present

## 2017-05-21 DIAGNOSIS — M6281 Muscle weakness (generalized): Secondary | ICD-10-CM | POA: Diagnosis not present

## 2017-05-21 DIAGNOSIS — G9340 Encephalopathy, unspecified: Secondary | ICD-10-CM | POA: Diagnosis not present

## 2017-05-21 DIAGNOSIS — Z89622 Acquired absence of left hip joint: Secondary | ICD-10-CM | POA: Diagnosis not present

## 2017-05-22 DIAGNOSIS — M6281 Muscle weakness (generalized): Secondary | ICD-10-CM | POA: Diagnosis not present

## 2017-05-22 DIAGNOSIS — Z89622 Acquired absence of left hip joint: Secondary | ICD-10-CM | POA: Diagnosis not present

## 2017-05-22 DIAGNOSIS — G9340 Encephalopathy, unspecified: Secondary | ICD-10-CM | POA: Diagnosis not present

## 2017-05-23 DIAGNOSIS — G9340 Encephalopathy, unspecified: Secondary | ICD-10-CM | POA: Diagnosis not present

## 2017-05-23 DIAGNOSIS — Z89622 Acquired absence of left hip joint: Secondary | ICD-10-CM | POA: Diagnosis not present

## 2017-05-23 DIAGNOSIS — M6281 Muscle weakness (generalized): Secondary | ICD-10-CM | POA: Diagnosis not present

## 2017-05-24 DIAGNOSIS — M6281 Muscle weakness (generalized): Secondary | ICD-10-CM | POA: Diagnosis not present

## 2017-05-24 DIAGNOSIS — G9340 Encephalopathy, unspecified: Secondary | ICD-10-CM | POA: Diagnosis not present

## 2017-05-24 DIAGNOSIS — Z89622 Acquired absence of left hip joint: Secondary | ICD-10-CM | POA: Diagnosis not present

## 2017-05-27 DIAGNOSIS — M6281 Muscle weakness (generalized): Secondary | ICD-10-CM | POA: Diagnosis not present

## 2017-05-27 DIAGNOSIS — Z89622 Acquired absence of left hip joint: Secondary | ICD-10-CM | POA: Diagnosis not present

## 2017-05-27 DIAGNOSIS — G9340 Encephalopathy, unspecified: Secondary | ICD-10-CM | POA: Diagnosis not present

## 2017-05-28 DIAGNOSIS — Z89622 Acquired absence of left hip joint: Secondary | ICD-10-CM | POA: Diagnosis not present

## 2017-05-28 DIAGNOSIS — M6281 Muscle weakness (generalized): Secondary | ICD-10-CM | POA: Diagnosis not present

## 2017-05-28 DIAGNOSIS — G9340 Encephalopathy, unspecified: Secondary | ICD-10-CM | POA: Diagnosis not present

## 2017-05-29 DIAGNOSIS — Z89622 Acquired absence of left hip joint: Secondary | ICD-10-CM | POA: Diagnosis not present

## 2017-05-29 DIAGNOSIS — M6281 Muscle weakness (generalized): Secondary | ICD-10-CM | POA: Diagnosis not present

## 2017-05-29 DIAGNOSIS — G9340 Encephalopathy, unspecified: Secondary | ICD-10-CM | POA: Diagnosis not present

## 2017-05-30 DIAGNOSIS — M6281 Muscle weakness (generalized): Secondary | ICD-10-CM | POA: Diagnosis not present

## 2017-05-30 DIAGNOSIS — Z89622 Acquired absence of left hip joint: Secondary | ICD-10-CM | POA: Diagnosis not present

## 2017-05-30 DIAGNOSIS — G9340 Encephalopathy, unspecified: Secondary | ICD-10-CM | POA: Diagnosis not present

## 2017-05-31 DIAGNOSIS — G9340 Encephalopathy, unspecified: Secondary | ICD-10-CM | POA: Diagnosis not present

## 2017-05-31 DIAGNOSIS — Z89622 Acquired absence of left hip joint: Secondary | ICD-10-CM | POA: Diagnosis not present

## 2017-05-31 DIAGNOSIS — M6281 Muscle weakness (generalized): Secondary | ICD-10-CM | POA: Diagnosis not present

## 2017-06-03 DIAGNOSIS — M6281 Muscle weakness (generalized): Secondary | ICD-10-CM | POA: Diagnosis not present

## 2017-06-03 DIAGNOSIS — G9340 Encephalopathy, unspecified: Secondary | ICD-10-CM | POA: Diagnosis not present

## 2017-06-03 DIAGNOSIS — Z89622 Acquired absence of left hip joint: Secondary | ICD-10-CM | POA: Diagnosis not present

## 2017-06-04 DIAGNOSIS — G9340 Encephalopathy, unspecified: Secondary | ICD-10-CM | POA: Diagnosis not present

## 2017-06-04 DIAGNOSIS — Z89622 Acquired absence of left hip joint: Secondary | ICD-10-CM | POA: Diagnosis not present

## 2017-06-04 DIAGNOSIS — M6281 Muscle weakness (generalized): Secondary | ICD-10-CM | POA: Diagnosis not present

## 2017-06-05 DIAGNOSIS — M6281 Muscle weakness (generalized): Secondary | ICD-10-CM | POA: Diagnosis not present

## 2017-06-05 DIAGNOSIS — Z89622 Acquired absence of left hip joint: Secondary | ICD-10-CM | POA: Diagnosis not present

## 2017-06-05 DIAGNOSIS — G9340 Encephalopathy, unspecified: Secondary | ICD-10-CM | POA: Diagnosis not present

## 2017-06-06 DIAGNOSIS — Z89622 Acquired absence of left hip joint: Secondary | ICD-10-CM | POA: Diagnosis not present

## 2017-06-06 DIAGNOSIS — G9340 Encephalopathy, unspecified: Secondary | ICD-10-CM | POA: Diagnosis not present

## 2017-06-06 DIAGNOSIS — M6281 Muscle weakness (generalized): Secondary | ICD-10-CM | POA: Diagnosis not present

## 2017-06-07 DIAGNOSIS — M6281 Muscle weakness (generalized): Secondary | ICD-10-CM | POA: Diagnosis not present

## 2017-06-07 DIAGNOSIS — Z89622 Acquired absence of left hip joint: Secondary | ICD-10-CM | POA: Diagnosis not present

## 2017-06-07 DIAGNOSIS — G9340 Encephalopathy, unspecified: Secondary | ICD-10-CM | POA: Diagnosis not present

## 2017-06-10 DIAGNOSIS — M6281 Muscle weakness (generalized): Secondary | ICD-10-CM | POA: Diagnosis not present

## 2017-06-10 DIAGNOSIS — G9340 Encephalopathy, unspecified: Secondary | ICD-10-CM | POA: Diagnosis not present

## 2017-06-10 DIAGNOSIS — Z89622 Acquired absence of left hip joint: Secondary | ICD-10-CM | POA: Diagnosis not present

## 2017-06-11 DIAGNOSIS — G9340 Encephalopathy, unspecified: Secondary | ICD-10-CM | POA: Diagnosis not present

## 2017-06-11 DIAGNOSIS — M6281 Muscle weakness (generalized): Secondary | ICD-10-CM | POA: Diagnosis not present

## 2017-06-11 DIAGNOSIS — M79674 Pain in right toe(s): Secondary | ICD-10-CM | POA: Diagnosis not present

## 2017-06-11 DIAGNOSIS — Z89622 Acquired absence of left hip joint: Secondary | ICD-10-CM | POA: Diagnosis not present

## 2017-06-11 DIAGNOSIS — B351 Tinea unguium: Secondary | ICD-10-CM | POA: Diagnosis not present

## 2017-06-12 DIAGNOSIS — Z89622 Acquired absence of left hip joint: Secondary | ICD-10-CM | POA: Diagnosis not present

## 2017-06-12 DIAGNOSIS — G9340 Encephalopathy, unspecified: Secondary | ICD-10-CM | POA: Diagnosis not present

## 2017-06-12 DIAGNOSIS — M6281 Muscle weakness (generalized): Secondary | ICD-10-CM | POA: Diagnosis not present

## 2017-06-13 DIAGNOSIS — G9340 Encephalopathy, unspecified: Secondary | ICD-10-CM | POA: Diagnosis not present

## 2017-06-13 DIAGNOSIS — M6281 Muscle weakness (generalized): Secondary | ICD-10-CM | POA: Diagnosis not present

## 2017-06-13 DIAGNOSIS — Z89622 Acquired absence of left hip joint: Secondary | ICD-10-CM | POA: Diagnosis not present

## 2017-06-14 DIAGNOSIS — G9340 Encephalopathy, unspecified: Secondary | ICD-10-CM | POA: Diagnosis not present

## 2017-06-14 DIAGNOSIS — Z89622 Acquired absence of left hip joint: Secondary | ICD-10-CM | POA: Diagnosis not present

## 2017-06-14 DIAGNOSIS — M6281 Muscle weakness (generalized): Secondary | ICD-10-CM | POA: Diagnosis not present

## 2017-06-17 DIAGNOSIS — M6281 Muscle weakness (generalized): Secondary | ICD-10-CM | POA: Diagnosis not present

## 2017-06-17 DIAGNOSIS — Z89622 Acquired absence of left hip joint: Secondary | ICD-10-CM | POA: Diagnosis not present

## 2017-06-17 DIAGNOSIS — G9340 Encephalopathy, unspecified: Secondary | ICD-10-CM | POA: Diagnosis not present

## 2017-06-17 DIAGNOSIS — B351 Tinea unguium: Secondary | ICD-10-CM | POA: Diagnosis not present

## 2017-06-18 DIAGNOSIS — M6281 Muscle weakness (generalized): Secondary | ICD-10-CM | POA: Diagnosis not present

## 2017-06-18 DIAGNOSIS — Z89622 Acquired absence of left hip joint: Secondary | ICD-10-CM | POA: Diagnosis not present

## 2017-06-18 DIAGNOSIS — G9340 Encephalopathy, unspecified: Secondary | ICD-10-CM | POA: Diagnosis not present

## 2017-06-18 DIAGNOSIS — D649 Anemia, unspecified: Secondary | ICD-10-CM | POA: Diagnosis not present

## 2017-06-18 DIAGNOSIS — Z5181 Encounter for therapeutic drug level monitoring: Secondary | ICD-10-CM | POA: Diagnosis not present

## 2017-06-19 DIAGNOSIS — Z89622 Acquired absence of left hip joint: Secondary | ICD-10-CM | POA: Diagnosis not present

## 2017-06-19 DIAGNOSIS — M6281 Muscle weakness (generalized): Secondary | ICD-10-CM | POA: Diagnosis not present

## 2017-06-19 DIAGNOSIS — G9340 Encephalopathy, unspecified: Secondary | ICD-10-CM | POA: Diagnosis not present

## 2017-06-20 DIAGNOSIS — G9340 Encephalopathy, unspecified: Secondary | ICD-10-CM | POA: Diagnosis not present

## 2017-06-20 DIAGNOSIS — M6281 Muscle weakness (generalized): Secondary | ICD-10-CM | POA: Diagnosis not present

## 2017-06-20 DIAGNOSIS — Z89622 Acquired absence of left hip joint: Secondary | ICD-10-CM | POA: Diagnosis not present

## 2017-06-21 DIAGNOSIS — G9340 Encephalopathy, unspecified: Secondary | ICD-10-CM | POA: Diagnosis not present

## 2017-06-21 DIAGNOSIS — Z89622 Acquired absence of left hip joint: Secondary | ICD-10-CM | POA: Diagnosis not present

## 2017-06-21 DIAGNOSIS — M6281 Muscle weakness (generalized): Secondary | ICD-10-CM | POA: Diagnosis not present

## 2017-06-24 DIAGNOSIS — Z89622 Acquired absence of left hip joint: Secondary | ICD-10-CM | POA: Diagnosis not present

## 2017-06-24 DIAGNOSIS — G9340 Encephalopathy, unspecified: Secondary | ICD-10-CM | POA: Diagnosis not present

## 2017-06-24 DIAGNOSIS — M6281 Muscle weakness (generalized): Secondary | ICD-10-CM | POA: Diagnosis not present

## 2017-06-25 DIAGNOSIS — G9340 Encephalopathy, unspecified: Secondary | ICD-10-CM | POA: Diagnosis not present

## 2017-06-25 DIAGNOSIS — M6281 Muscle weakness (generalized): Secondary | ICD-10-CM | POA: Diagnosis not present

## 2017-06-25 DIAGNOSIS — Z89622 Acquired absence of left hip joint: Secondary | ICD-10-CM | POA: Diagnosis not present

## 2017-06-26 DIAGNOSIS — G9340 Encephalopathy, unspecified: Secondary | ICD-10-CM | POA: Diagnosis not present

## 2017-06-26 DIAGNOSIS — Z89622 Acquired absence of left hip joint: Secondary | ICD-10-CM | POA: Diagnosis not present

## 2017-06-26 DIAGNOSIS — M6281 Muscle weakness (generalized): Secondary | ICD-10-CM | POA: Diagnosis not present

## 2017-06-27 DIAGNOSIS — Z89622 Acquired absence of left hip joint: Secondary | ICD-10-CM | POA: Diagnosis not present

## 2017-06-27 DIAGNOSIS — M6281 Muscle weakness (generalized): Secondary | ICD-10-CM | POA: Diagnosis not present

## 2017-06-27 DIAGNOSIS — G9340 Encephalopathy, unspecified: Secondary | ICD-10-CM | POA: Diagnosis not present

## 2017-06-28 DIAGNOSIS — G9340 Encephalopathy, unspecified: Secondary | ICD-10-CM | POA: Diagnosis not present

## 2017-06-28 DIAGNOSIS — Z89622 Acquired absence of left hip joint: Secondary | ICD-10-CM | POA: Diagnosis not present

## 2017-06-28 DIAGNOSIS — M6281 Muscle weakness (generalized): Secondary | ICD-10-CM | POA: Diagnosis not present

## 2017-07-01 DIAGNOSIS — Z89622 Acquired absence of left hip joint: Secondary | ICD-10-CM | POA: Diagnosis not present

## 2017-07-01 DIAGNOSIS — M6281 Muscle weakness (generalized): Secondary | ICD-10-CM | POA: Diagnosis not present

## 2017-07-01 DIAGNOSIS — G9340 Encephalopathy, unspecified: Secondary | ICD-10-CM | POA: Diagnosis not present

## 2017-07-02 DIAGNOSIS — G9340 Encephalopathy, unspecified: Secondary | ICD-10-CM | POA: Diagnosis not present

## 2017-07-02 DIAGNOSIS — M6281 Muscle weakness (generalized): Secondary | ICD-10-CM | POA: Diagnosis not present

## 2017-07-02 DIAGNOSIS — Z89622 Acquired absence of left hip joint: Secondary | ICD-10-CM | POA: Diagnosis not present

## 2017-07-03 DIAGNOSIS — D509 Iron deficiency anemia, unspecified: Secondary | ICD-10-CM | POA: Diagnosis not present

## 2017-07-03 DIAGNOSIS — M6281 Muscle weakness (generalized): Secondary | ICD-10-CM | POA: Diagnosis not present

## 2017-07-03 DIAGNOSIS — D649 Anemia, unspecified: Secondary | ICD-10-CM | POA: Diagnosis not present

## 2017-07-03 DIAGNOSIS — J449 Chronic obstructive pulmonary disease, unspecified: Secondary | ICD-10-CM | POA: Diagnosis not present

## 2017-07-03 DIAGNOSIS — Z89622 Acquired absence of left hip joint: Secondary | ICD-10-CM | POA: Diagnosis not present

## 2017-07-03 DIAGNOSIS — G9340 Encephalopathy, unspecified: Secondary | ICD-10-CM | POA: Diagnosis not present

## 2017-07-04 DIAGNOSIS — Z89622 Acquired absence of left hip joint: Secondary | ICD-10-CM | POA: Diagnosis not present

## 2017-07-04 DIAGNOSIS — G9340 Encephalopathy, unspecified: Secondary | ICD-10-CM | POA: Diagnosis not present

## 2017-07-04 DIAGNOSIS — M6281 Muscle weakness (generalized): Secondary | ICD-10-CM | POA: Diagnosis not present

## 2017-07-05 DIAGNOSIS — M6281 Muscle weakness (generalized): Secondary | ICD-10-CM | POA: Diagnosis not present

## 2017-07-05 DIAGNOSIS — Z89622 Acquired absence of left hip joint: Secondary | ICD-10-CM | POA: Diagnosis not present

## 2017-07-05 DIAGNOSIS — G9340 Encephalopathy, unspecified: Secondary | ICD-10-CM | POA: Diagnosis not present

## 2017-07-08 DIAGNOSIS — M6281 Muscle weakness (generalized): Secondary | ICD-10-CM | POA: Diagnosis not present

## 2017-07-08 DIAGNOSIS — Z89622 Acquired absence of left hip joint: Secondary | ICD-10-CM | POA: Diagnosis not present

## 2017-07-08 DIAGNOSIS — G9340 Encephalopathy, unspecified: Secondary | ICD-10-CM | POA: Diagnosis not present

## 2017-07-09 DIAGNOSIS — M6281 Muscle weakness (generalized): Secondary | ICD-10-CM | POA: Diagnosis not present

## 2017-07-09 DIAGNOSIS — G9340 Encephalopathy, unspecified: Secondary | ICD-10-CM | POA: Diagnosis not present

## 2017-07-09 DIAGNOSIS — M4800 Spinal stenosis, site unspecified: Secondary | ICD-10-CM | POA: Diagnosis not present

## 2017-07-09 DIAGNOSIS — F329 Major depressive disorder, single episode, unspecified: Secondary | ICD-10-CM | POA: Diagnosis not present

## 2017-07-09 DIAGNOSIS — R52 Pain, unspecified: Secondary | ICD-10-CM | POA: Diagnosis not present

## 2017-07-09 DIAGNOSIS — Z89622 Acquired absence of left hip joint: Secondary | ICD-10-CM | POA: Diagnosis not present

## 2017-07-09 DIAGNOSIS — D649 Anemia, unspecified: Secondary | ICD-10-CM | POA: Diagnosis not present

## 2017-07-16 DIAGNOSIS — D649 Anemia, unspecified: Secondary | ICD-10-CM | POA: Diagnosis not present

## 2017-07-16 DIAGNOSIS — E039 Hypothyroidism, unspecified: Secondary | ICD-10-CM | POA: Diagnosis not present

## 2017-07-16 DIAGNOSIS — I10 Essential (primary) hypertension: Secondary | ICD-10-CM | POA: Diagnosis not present

## 2017-07-16 DIAGNOSIS — Z5181 Encounter for therapeutic drug level monitoring: Secondary | ICD-10-CM | POA: Diagnosis not present

## 2017-07-16 DIAGNOSIS — E785 Hyperlipidemia, unspecified: Secondary | ICD-10-CM | POA: Diagnosis not present

## 2017-07-19 DIAGNOSIS — Z5181 Encounter for therapeutic drug level monitoring: Secondary | ICD-10-CM | POA: Diagnosis not present

## 2017-07-19 DIAGNOSIS — D649 Anemia, unspecified: Secondary | ICD-10-CM | POA: Diagnosis not present

## 2017-08-19 DIAGNOSIS — Z5181 Encounter for therapeutic drug level monitoring: Secondary | ICD-10-CM | POA: Diagnosis not present

## 2017-08-19 DIAGNOSIS — D649 Anemia, unspecified: Secondary | ICD-10-CM | POA: Diagnosis not present

## 2017-08-26 DIAGNOSIS — E039 Hypothyroidism, unspecified: Secondary | ICD-10-CM | POA: Diagnosis not present

## 2017-08-26 DIAGNOSIS — E785 Hyperlipidemia, unspecified: Secondary | ICD-10-CM | POA: Diagnosis not present

## 2017-08-26 DIAGNOSIS — G8929 Other chronic pain: Secondary | ICD-10-CM | POA: Diagnosis not present

## 2017-08-28 DIAGNOSIS — M79675 Pain in left toe(s): Secondary | ICD-10-CM | POA: Diagnosis not present

## 2017-08-28 DIAGNOSIS — B351 Tinea unguium: Secondary | ICD-10-CM | POA: Diagnosis not present

## 2017-08-28 DIAGNOSIS — M79674 Pain in right toe(s): Secondary | ICD-10-CM | POA: Diagnosis not present

## 2017-09-03 DIAGNOSIS — E119 Type 2 diabetes mellitus without complications: Secondary | ICD-10-CM | POA: Diagnosis not present

## 2017-09-03 DIAGNOSIS — Z961 Presence of intraocular lens: Secondary | ICD-10-CM | POA: Diagnosis not present

## 2017-09-03 DIAGNOSIS — H26493 Other secondary cataract, bilateral: Secondary | ICD-10-CM | POA: Diagnosis not present

## 2017-09-03 DIAGNOSIS — Z7951 Long term (current) use of inhaled steroids: Secondary | ICD-10-CM | POA: Diagnosis not present

## 2017-09-09 DIAGNOSIS — J329 Chronic sinusitis, unspecified: Secondary | ICD-10-CM | POA: Diagnosis not present

## 2017-09-21 DIAGNOSIS — D649 Anemia, unspecified: Secondary | ICD-10-CM | POA: Diagnosis not present

## 2017-09-21 DIAGNOSIS — Z5181 Encounter for therapeutic drug level monitoring: Secondary | ICD-10-CM | POA: Diagnosis not present

## 2017-10-22 DIAGNOSIS — E785 Hyperlipidemia, unspecified: Secondary | ICD-10-CM | POA: Diagnosis not present

## 2017-10-22 DIAGNOSIS — G8929 Other chronic pain: Secondary | ICD-10-CM | POA: Diagnosis not present

## 2017-10-22 DIAGNOSIS — F329 Major depressive disorder, single episode, unspecified: Secondary | ICD-10-CM | POA: Diagnosis not present

## 2017-10-22 DIAGNOSIS — Z5181 Encounter for therapeutic drug level monitoring: Secondary | ICD-10-CM | POA: Diagnosis not present

## 2017-10-22 DIAGNOSIS — D649 Anemia, unspecified: Secondary | ICD-10-CM | POA: Diagnosis not present

## 2017-11-20 DIAGNOSIS — M79674 Pain in right toe(s): Secondary | ICD-10-CM | POA: Diagnosis not present

## 2017-11-20 DIAGNOSIS — M79675 Pain in left toe(s): Secondary | ICD-10-CM | POA: Diagnosis not present

## 2017-11-20 DIAGNOSIS — B351 Tinea unguium: Secondary | ICD-10-CM | POA: Diagnosis not present

## 2017-11-23 DIAGNOSIS — J9811 Atelectasis: Secondary | ICD-10-CM | POA: Diagnosis not present

## 2017-11-25 DIAGNOSIS — D649 Anemia, unspecified: Secondary | ICD-10-CM | POA: Diagnosis not present

## 2017-11-25 DIAGNOSIS — Z5181 Encounter for therapeutic drug level monitoring: Secondary | ICD-10-CM | POA: Diagnosis not present

## 2017-12-11 DIAGNOSIS — Z89622 Acquired absence of left hip joint: Secondary | ICD-10-CM | POA: Diagnosis not present

## 2017-12-11 DIAGNOSIS — M6281 Muscle weakness (generalized): Secondary | ICD-10-CM | POA: Diagnosis not present

## 2017-12-11 DIAGNOSIS — G9340 Encephalopathy, unspecified: Secondary | ICD-10-CM | POA: Diagnosis not present

## 2017-12-12 DIAGNOSIS — D649 Anemia, unspecified: Secondary | ICD-10-CM | POA: Diagnosis not present

## 2017-12-12 DIAGNOSIS — M6281 Muscle weakness (generalized): Secondary | ICD-10-CM | POA: Diagnosis not present

## 2017-12-12 DIAGNOSIS — Z89622 Acquired absence of left hip joint: Secondary | ICD-10-CM | POA: Diagnosis not present

## 2017-12-12 DIAGNOSIS — E039 Hypothyroidism, unspecified: Secondary | ICD-10-CM | POA: Diagnosis not present

## 2017-12-12 DIAGNOSIS — J449 Chronic obstructive pulmonary disease, unspecified: Secondary | ICD-10-CM | POA: Diagnosis not present

## 2017-12-12 DIAGNOSIS — G9340 Encephalopathy, unspecified: Secondary | ICD-10-CM | POA: Diagnosis not present

## 2017-12-12 DIAGNOSIS — E559 Vitamin D deficiency, unspecified: Secondary | ICD-10-CM | POA: Diagnosis not present

## 2017-12-13 DIAGNOSIS — M6281 Muscle weakness (generalized): Secondary | ICD-10-CM | POA: Diagnosis not present

## 2017-12-13 DIAGNOSIS — G9340 Encephalopathy, unspecified: Secondary | ICD-10-CM | POA: Diagnosis not present

## 2017-12-13 DIAGNOSIS — Z89622 Acquired absence of left hip joint: Secondary | ICD-10-CM | POA: Diagnosis not present

## 2017-12-16 DIAGNOSIS — M6281 Muscle weakness (generalized): Secondary | ICD-10-CM | POA: Diagnosis not present

## 2017-12-16 DIAGNOSIS — Z89622 Acquired absence of left hip joint: Secondary | ICD-10-CM | POA: Diagnosis not present

## 2017-12-16 DIAGNOSIS — G9340 Encephalopathy, unspecified: Secondary | ICD-10-CM | POA: Diagnosis not present

## 2017-12-17 DIAGNOSIS — M6281 Muscle weakness (generalized): Secondary | ICD-10-CM | POA: Diagnosis not present

## 2017-12-18 DIAGNOSIS — M6281 Muscle weakness (generalized): Secondary | ICD-10-CM | POA: Diagnosis not present

## 2017-12-19 DIAGNOSIS — M6281 Muscle weakness (generalized): Secondary | ICD-10-CM | POA: Diagnosis not present

## 2017-12-20 DIAGNOSIS — E785 Hyperlipidemia, unspecified: Secondary | ICD-10-CM | POA: Diagnosis not present

## 2017-12-20 DIAGNOSIS — Z79899 Other long term (current) drug therapy: Secondary | ICD-10-CM | POA: Diagnosis not present

## 2017-12-20 DIAGNOSIS — E559 Vitamin D deficiency, unspecified: Secondary | ICD-10-CM | POA: Diagnosis not present

## 2017-12-20 DIAGNOSIS — M6281 Muscle weakness (generalized): Secondary | ICD-10-CM | POA: Diagnosis not present

## 2017-12-20 DIAGNOSIS — E039 Hypothyroidism, unspecified: Secondary | ICD-10-CM | POA: Diagnosis not present

## 2017-12-20 DIAGNOSIS — J449 Chronic obstructive pulmonary disease, unspecified: Secondary | ICD-10-CM | POA: Diagnosis not present

## 2017-12-20 DIAGNOSIS — I10 Essential (primary) hypertension: Secondary | ICD-10-CM | POA: Diagnosis not present

## 2017-12-20 DIAGNOSIS — D649 Anemia, unspecified: Secondary | ICD-10-CM | POA: Diagnosis not present

## 2017-12-23 DIAGNOSIS — D649 Anemia, unspecified: Secondary | ICD-10-CM | POA: Diagnosis not present

## 2017-12-23 DIAGNOSIS — K219 Gastro-esophageal reflux disease without esophagitis: Secondary | ICD-10-CM | POA: Diagnosis not present

## 2017-12-23 DIAGNOSIS — E039 Hypothyroidism, unspecified: Secondary | ICD-10-CM | POA: Diagnosis not present

## 2017-12-23 DIAGNOSIS — M6281 Muscle weakness (generalized): Secondary | ICD-10-CM | POA: Diagnosis not present

## 2017-12-24 DIAGNOSIS — M6281 Muscle weakness (generalized): Secondary | ICD-10-CM | POA: Diagnosis not present

## 2017-12-25 DIAGNOSIS — D649 Anemia, unspecified: Secondary | ICD-10-CM | POA: Diagnosis not present

## 2017-12-25 DIAGNOSIS — M6281 Muscle weakness (generalized): Secondary | ICD-10-CM | POA: Diagnosis not present

## 2017-12-25 DIAGNOSIS — Z5181 Encounter for therapeutic drug level monitoring: Secondary | ICD-10-CM | POA: Diagnosis not present

## 2017-12-26 DIAGNOSIS — M6281 Muscle weakness (generalized): Secondary | ICD-10-CM | POA: Diagnosis not present

## 2017-12-27 DIAGNOSIS — M6281 Muscle weakness (generalized): Secondary | ICD-10-CM | POA: Diagnosis not present

## 2017-12-30 DIAGNOSIS — M6281 Muscle weakness (generalized): Secondary | ICD-10-CM | POA: Diagnosis not present

## 2017-12-31 DIAGNOSIS — M6281 Muscle weakness (generalized): Secondary | ICD-10-CM | POA: Diagnosis not present

## 2018-01-01 DIAGNOSIS — M6281 Muscle weakness (generalized): Secondary | ICD-10-CM | POA: Diagnosis not present

## 2018-01-02 DIAGNOSIS — M6281 Muscle weakness (generalized): Secondary | ICD-10-CM | POA: Diagnosis not present

## 2018-01-03 DIAGNOSIS — M6281 Muscle weakness (generalized): Secondary | ICD-10-CM | POA: Diagnosis not present

## 2018-01-06 DIAGNOSIS — M6281 Muscle weakness (generalized): Secondary | ICD-10-CM | POA: Diagnosis not present

## 2018-01-07 DIAGNOSIS — M6281 Muscle weakness (generalized): Secondary | ICD-10-CM | POA: Diagnosis not present

## 2018-01-08 DIAGNOSIS — M6281 Muscle weakness (generalized): Secondary | ICD-10-CM | POA: Diagnosis not present

## 2018-01-09 DIAGNOSIS — M6281 Muscle weakness (generalized): Secondary | ICD-10-CM | POA: Diagnosis not present

## 2018-01-10 DIAGNOSIS — M6281 Muscle weakness (generalized): Secondary | ICD-10-CM | POA: Diagnosis not present

## 2018-01-13 DIAGNOSIS — M6281 Muscle weakness (generalized): Secondary | ICD-10-CM | POA: Diagnosis not present

## 2018-01-14 DIAGNOSIS — M6281 Muscle weakness (generalized): Secondary | ICD-10-CM | POA: Diagnosis not present

## 2018-01-15 DIAGNOSIS — M6281 Muscle weakness (generalized): Secondary | ICD-10-CM | POA: Diagnosis not present

## 2018-01-16 DIAGNOSIS — M6281 Muscle weakness (generalized): Secondary | ICD-10-CM | POA: Diagnosis not present

## 2018-01-17 DIAGNOSIS — M6281 Muscle weakness (generalized): Secondary | ICD-10-CM | POA: Diagnosis not present

## 2018-01-20 DIAGNOSIS — M6281 Muscle weakness (generalized): Secondary | ICD-10-CM | POA: Diagnosis not present

## 2018-01-21 DIAGNOSIS — M6281 Muscle weakness (generalized): Secondary | ICD-10-CM | POA: Diagnosis not present

## 2018-01-22 DIAGNOSIS — E785 Hyperlipidemia, unspecified: Secondary | ICD-10-CM | POA: Diagnosis not present

## 2018-01-22 DIAGNOSIS — E039 Hypothyroidism, unspecified: Secondary | ICD-10-CM | POA: Diagnosis not present

## 2018-01-22 DIAGNOSIS — N400 Enlarged prostate without lower urinary tract symptoms: Secondary | ICD-10-CM | POA: Diagnosis not present

## 2018-01-22 DIAGNOSIS — M6281 Muscle weakness (generalized): Secondary | ICD-10-CM | POA: Diagnosis not present

## 2018-01-23 DIAGNOSIS — D509 Iron deficiency anemia, unspecified: Secondary | ICD-10-CM | POA: Diagnosis not present

## 2018-01-23 DIAGNOSIS — M6281 Muscle weakness (generalized): Secondary | ICD-10-CM | POA: Diagnosis not present

## 2018-01-23 DIAGNOSIS — D649 Anemia, unspecified: Secondary | ICD-10-CM | POA: Diagnosis not present

## 2018-01-24 DIAGNOSIS — M6281 Muscle weakness (generalized): Secondary | ICD-10-CM | POA: Diagnosis not present

## 2018-01-25 DIAGNOSIS — D649 Anemia, unspecified: Secondary | ICD-10-CM | POA: Diagnosis not present

## 2018-01-25 DIAGNOSIS — Z5181 Encounter for therapeutic drug level monitoring: Secondary | ICD-10-CM | POA: Diagnosis not present

## 2018-01-27 DIAGNOSIS — M6281 Muscle weakness (generalized): Secondary | ICD-10-CM | POA: Diagnosis not present

## 2018-01-28 DIAGNOSIS — M6281 Muscle weakness (generalized): Secondary | ICD-10-CM | POA: Diagnosis not present

## 2018-01-29 DIAGNOSIS — M6281 Muscle weakness (generalized): Secondary | ICD-10-CM | POA: Diagnosis not present

## 2018-01-30 DIAGNOSIS — M6281 Muscle weakness (generalized): Secondary | ICD-10-CM | POA: Diagnosis not present

## 2018-01-31 DIAGNOSIS — M6281 Muscle weakness (generalized): Secondary | ICD-10-CM | POA: Diagnosis not present

## 2018-02-03 DIAGNOSIS — D649 Anemia, unspecified: Secondary | ICD-10-CM | POA: Diagnosis not present

## 2018-02-03 DIAGNOSIS — M6281 Muscle weakness (generalized): Secondary | ICD-10-CM | POA: Diagnosis not present

## 2018-02-03 DIAGNOSIS — J449 Chronic obstructive pulmonary disease, unspecified: Secondary | ICD-10-CM | POA: Diagnosis not present

## 2018-02-03 DIAGNOSIS — K219 Gastro-esophageal reflux disease without esophagitis: Secondary | ICD-10-CM | POA: Diagnosis not present

## 2018-02-04 DIAGNOSIS — M6281 Muscle weakness (generalized): Secondary | ICD-10-CM | POA: Diagnosis not present

## 2018-02-05 DIAGNOSIS — M79675 Pain in left toe(s): Secondary | ICD-10-CM | POA: Diagnosis not present

## 2018-02-05 DIAGNOSIS — B351 Tinea unguium: Secondary | ICD-10-CM | POA: Diagnosis not present

## 2018-02-05 DIAGNOSIS — M6281 Muscle weakness (generalized): Secondary | ICD-10-CM | POA: Diagnosis not present

## 2018-02-05 DIAGNOSIS — M79674 Pain in right toe(s): Secondary | ICD-10-CM | POA: Diagnosis not present

## 2018-02-06 DIAGNOSIS — M6281 Muscle weakness (generalized): Secondary | ICD-10-CM | POA: Diagnosis not present

## 2018-02-07 DIAGNOSIS — M6281 Muscle weakness (generalized): Secondary | ICD-10-CM | POA: Diagnosis not present

## 2018-02-10 DIAGNOSIS — M6281 Muscle weakness (generalized): Secondary | ICD-10-CM | POA: Diagnosis not present

## 2018-02-11 DIAGNOSIS — M6281 Muscle weakness (generalized): Secondary | ICD-10-CM | POA: Diagnosis not present

## 2018-02-12 DIAGNOSIS — M6281 Muscle weakness (generalized): Secondary | ICD-10-CM | POA: Diagnosis not present

## 2018-02-13 DIAGNOSIS — M6281 Muscle weakness (generalized): Secondary | ICD-10-CM | POA: Diagnosis not present

## 2018-02-14 DIAGNOSIS — M6281 Muscle weakness (generalized): Secondary | ICD-10-CM | POA: Diagnosis not present

## 2018-02-17 DIAGNOSIS — M6281 Muscle weakness (generalized): Secondary | ICD-10-CM | POA: Diagnosis not present

## 2018-02-18 DIAGNOSIS — M6281 Muscle weakness (generalized): Secondary | ICD-10-CM | POA: Diagnosis not present

## 2018-02-19 DIAGNOSIS — M6281 Muscle weakness (generalized): Secondary | ICD-10-CM | POA: Diagnosis not present

## 2018-02-20 DIAGNOSIS — M6281 Muscle weakness (generalized): Secondary | ICD-10-CM | POA: Diagnosis not present

## 2018-02-21 DIAGNOSIS — M6281 Muscle weakness (generalized): Secondary | ICD-10-CM | POA: Diagnosis not present

## 2018-02-24 DIAGNOSIS — M6281 Muscle weakness (generalized): Secondary | ICD-10-CM | POA: Diagnosis not present

## 2018-02-25 DIAGNOSIS — M6281 Muscle weakness (generalized): Secondary | ICD-10-CM | POA: Diagnosis not present

## 2018-02-26 DIAGNOSIS — M6281 Muscle weakness (generalized): Secondary | ICD-10-CM | POA: Diagnosis not present

## 2018-02-27 DIAGNOSIS — M6281 Muscle weakness (generalized): Secondary | ICD-10-CM | POA: Diagnosis not present

## 2018-02-28 DIAGNOSIS — M6281 Muscle weakness (generalized): Secondary | ICD-10-CM | POA: Diagnosis not present

## 2018-03-03 DIAGNOSIS — F79 Unspecified intellectual disabilities: Secondary | ICD-10-CM | POA: Diagnosis not present

## 2018-03-03 DIAGNOSIS — R413 Other amnesia: Secondary | ICD-10-CM | POA: Diagnosis not present

## 2018-03-03 DIAGNOSIS — M6281 Muscle weakness (generalized): Secondary | ICD-10-CM | POA: Diagnosis not present

## 2018-03-03 DIAGNOSIS — R471 Dysarthria and anarthria: Secondary | ICD-10-CM | POA: Diagnosis not present

## 2018-03-03 DIAGNOSIS — F0390 Unspecified dementia without behavioral disturbance: Secondary | ICD-10-CM | POA: Diagnosis not present

## 2018-03-04 DIAGNOSIS — M6281 Muscle weakness (generalized): Secondary | ICD-10-CM | POA: Diagnosis not present

## 2018-03-05 ENCOUNTER — Other Ambulatory Visit (HOSPITAL_COMMUNITY): Payer: Self-pay | Admitting: Neurology

## 2018-03-05 DIAGNOSIS — G309 Alzheimer's disease, unspecified: Secondary | ICD-10-CM

## 2018-03-05 DIAGNOSIS — F0280 Dementia in other diseases classified elsewhere without behavioral disturbance: Secondary | ICD-10-CM

## 2018-03-05 DIAGNOSIS — M6281 Muscle weakness (generalized): Secondary | ICD-10-CM | POA: Diagnosis not present

## 2018-03-05 DIAGNOSIS — R413 Other amnesia: Secondary | ICD-10-CM

## 2018-03-05 DIAGNOSIS — R471 Dysarthria and anarthria: Secondary | ICD-10-CM

## 2018-03-06 DIAGNOSIS — M6281 Muscle weakness (generalized): Secondary | ICD-10-CM | POA: Diagnosis not present

## 2018-03-07 DIAGNOSIS — M6281 Muscle weakness (generalized): Secondary | ICD-10-CM | POA: Diagnosis not present

## 2018-03-10 DIAGNOSIS — M6281 Muscle weakness (generalized): Secondary | ICD-10-CM | POA: Diagnosis not present

## 2018-03-11 DIAGNOSIS — M6281 Muscle weakness (generalized): Secondary | ICD-10-CM | POA: Diagnosis not present

## 2018-03-13 DIAGNOSIS — M6281 Muscle weakness (generalized): Secondary | ICD-10-CM | POA: Diagnosis not present

## 2018-03-14 DIAGNOSIS — M6281 Muscle weakness (generalized): Secondary | ICD-10-CM | POA: Diagnosis not present

## 2018-03-15 DIAGNOSIS — M6281 Muscle weakness (generalized): Secondary | ICD-10-CM | POA: Diagnosis not present

## 2018-03-17 ENCOUNTER — Ambulatory Visit (HOSPITAL_COMMUNITY)
Admission: RE | Admit: 2018-03-17 | Discharge: 2018-03-17 | Disposition: A | Discharge status: Home / Self Care | Payer: Medicare Other | Source: Ambulatory Visit | Attending: Neurology | Admitting: Neurology

## 2018-03-17 DIAGNOSIS — R4182 Altered mental status, unspecified: Secondary | ICD-10-CM | POA: Diagnosis not present

## 2018-03-17 DIAGNOSIS — G309 Alzheimer's disease, unspecified: Secondary | ICD-10-CM | POA: Insufficient documentation

## 2018-03-17 DIAGNOSIS — R471 Dysarthria and anarthria: Secondary | ICD-10-CM

## 2018-03-17 DIAGNOSIS — J449 Chronic obstructive pulmonary disease, unspecified: Secondary | ICD-10-CM | POA: Diagnosis not present

## 2018-03-17 DIAGNOSIS — E559 Vitamin D deficiency, unspecified: Secondary | ICD-10-CM | POA: Diagnosis not present

## 2018-03-17 DIAGNOSIS — F0280 Dementia in other diseases classified elsewhere without behavioral disturbance: Secondary | ICD-10-CM | POA: Insufficient documentation

## 2018-03-17 DIAGNOSIS — M6281 Muscle weakness (generalized): Secondary | ICD-10-CM | POA: Diagnosis not present

## 2018-03-17 DIAGNOSIS — R413 Other amnesia: Secondary | ICD-10-CM | POA: Insufficient documentation

## 2018-03-17 DIAGNOSIS — K219 Gastro-esophageal reflux disease without esophagitis: Secondary | ICD-10-CM | POA: Diagnosis not present

## 2018-03-18 DIAGNOSIS — M6281 Muscle weakness (generalized): Secondary | ICD-10-CM | POA: Diagnosis not present

## 2018-03-19 DIAGNOSIS — M6281 Muscle weakness (generalized): Secondary | ICD-10-CM | POA: Diagnosis not present

## 2018-03-20 DIAGNOSIS — M6281 Muscle weakness (generalized): Secondary | ICD-10-CM | POA: Diagnosis not present

## 2018-03-21 DIAGNOSIS — M6281 Muscle weakness (generalized): Secondary | ICD-10-CM | POA: Diagnosis not present

## 2018-03-24 DIAGNOSIS — M6281 Muscle weakness (generalized): Secondary | ICD-10-CM | POA: Diagnosis not present

## 2018-03-25 DIAGNOSIS — M6281 Muscle weakness (generalized): Secondary | ICD-10-CM | POA: Diagnosis not present

## 2018-03-26 DIAGNOSIS — M6281 Muscle weakness (generalized): Secondary | ICD-10-CM | POA: Diagnosis not present

## 2018-03-27 DIAGNOSIS — M6281 Muscle weakness (generalized): Secondary | ICD-10-CM | POA: Diagnosis not present

## 2018-03-28 DIAGNOSIS — M6281 Muscle weakness (generalized): Secondary | ICD-10-CM | POA: Diagnosis not present

## 2018-03-31 DIAGNOSIS — I6990 Unspecified sequelae of unspecified cerebrovascular disease: Secondary | ICD-10-CM | POA: Diagnosis not present

## 2018-03-31 DIAGNOSIS — M6281 Muscle weakness (generalized): Secondary | ICD-10-CM | POA: Diagnosis not present

## 2018-03-31 DIAGNOSIS — F79 Unspecified intellectual disabilities: Secondary | ICD-10-CM | POA: Diagnosis not present

## 2018-03-31 DIAGNOSIS — R471 Dysarthria and anarthria: Secondary | ICD-10-CM | POA: Diagnosis not present

## 2018-04-01 DIAGNOSIS — M6281 Muscle weakness (generalized): Secondary | ICD-10-CM | POA: Diagnosis not present

## 2018-04-02 DIAGNOSIS — M6281 Muscle weakness (generalized): Secondary | ICD-10-CM | POA: Diagnosis not present

## 2018-04-03 DIAGNOSIS — M6281 Muscle weakness (generalized): Secondary | ICD-10-CM | POA: Diagnosis not present

## 2018-04-03 DIAGNOSIS — I6523 Occlusion and stenosis of bilateral carotid arteries: Secondary | ICD-10-CM | POA: Diagnosis not present

## 2018-04-30 DIAGNOSIS — B351 Tinea unguium: Secondary | ICD-10-CM | POA: Diagnosis not present

## 2018-04-30 DIAGNOSIS — M79674 Pain in right toe(s): Secondary | ICD-10-CM | POA: Diagnosis not present

## 2018-04-30 DIAGNOSIS — M79675 Pain in left toe(s): Secondary | ICD-10-CM | POA: Diagnosis not present

## 2018-05-02 DIAGNOSIS — J449 Chronic obstructive pulmonary disease, unspecified: Secondary | ICD-10-CM | POA: Diagnosis not present

## 2018-05-02 DIAGNOSIS — F0390 Unspecified dementia without behavioral disturbance: Secondary | ICD-10-CM | POA: Diagnosis not present

## 2018-05-02 DIAGNOSIS — M4800 Spinal stenosis, site unspecified: Secondary | ICD-10-CM | POA: Diagnosis not present

## 2018-06-04 IMAGING — DX DG SHOULDER 2+V*R*
2 series · 2 of 2 positions shown · non-contrast
Comparison: None.

CLINICAL DATA: Fall out of bed at [HOSPITAL]. Right shoulder
pain.

EXAM:
RIGHT SHOULDER - 2+ VIEW

[shoulder grashey]
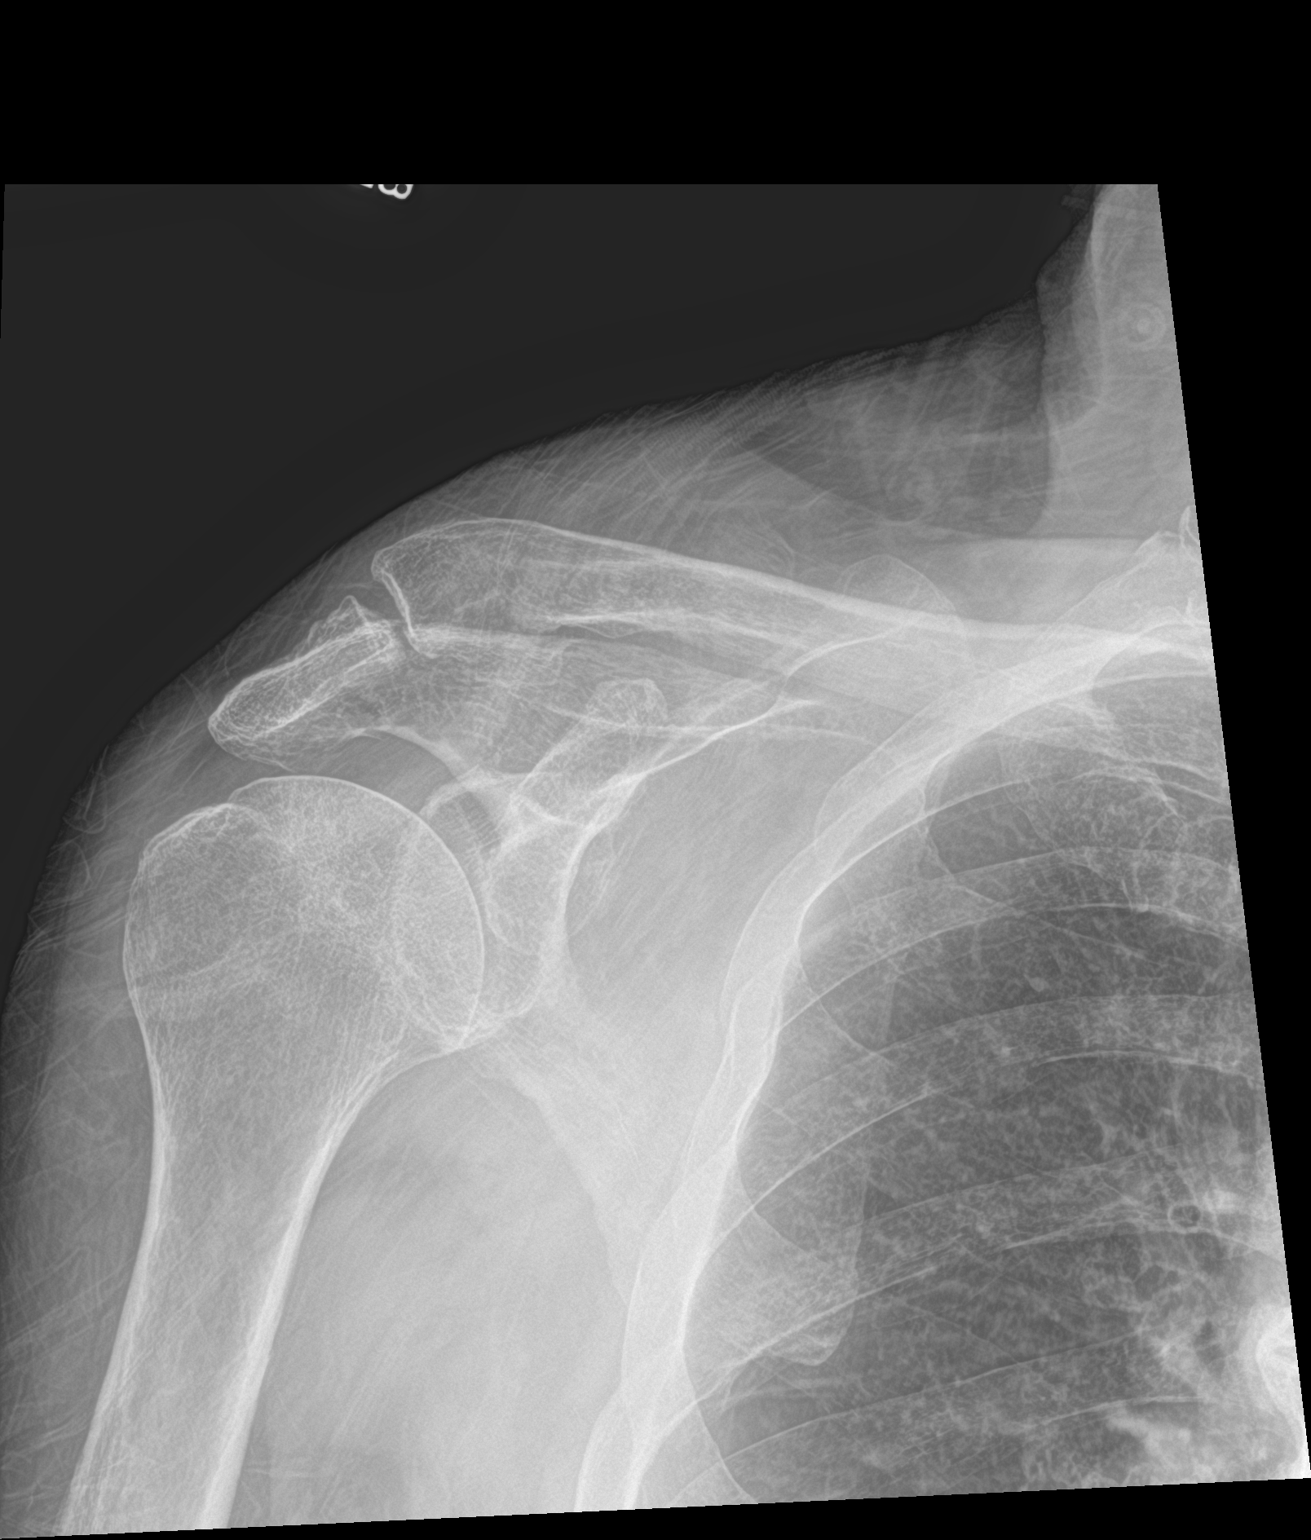

[shoulder y view]
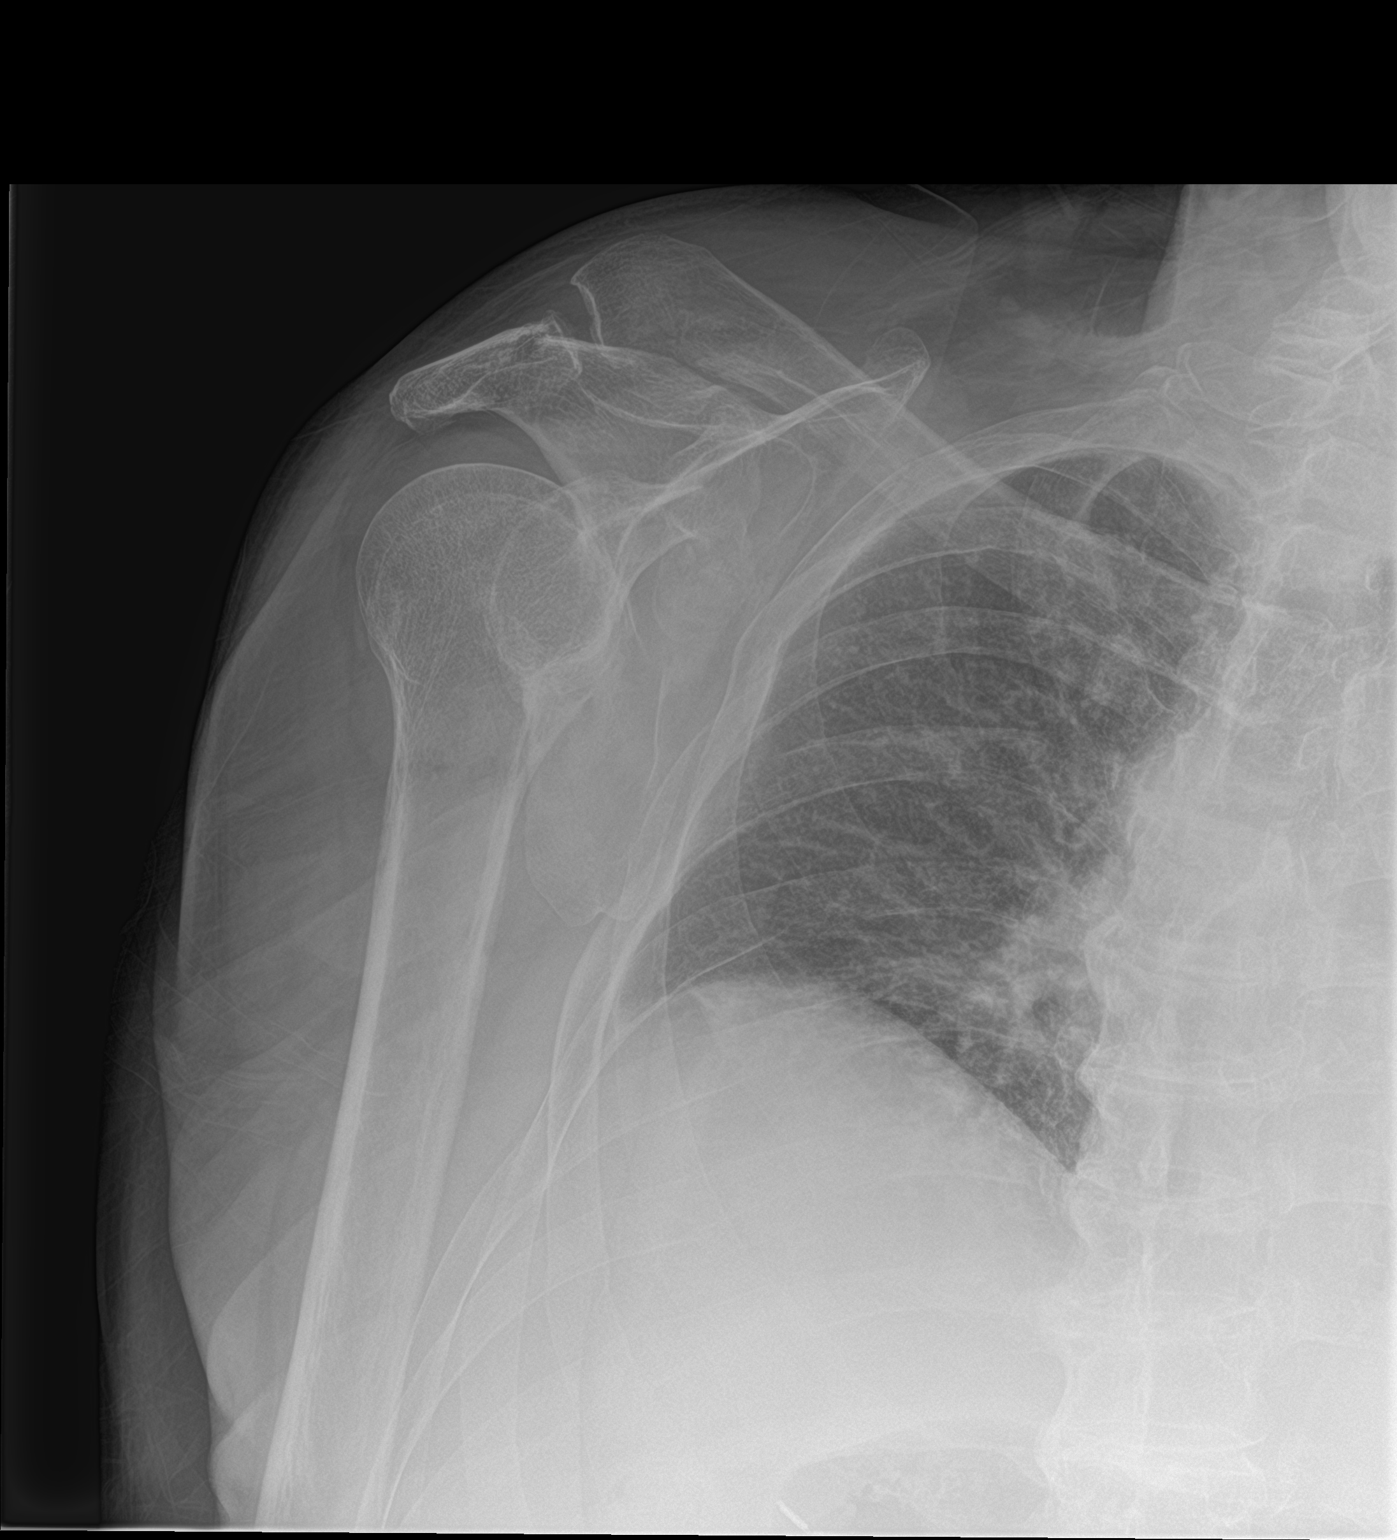

[2 of 2 positions shown; findings below may reference images not displayed]

FINDINGS: Suspect nondisplaced distal clavicle fracture. This is suboptimally
assessed due to patient positioning. Acromioclavicular and
coracoclavicular joint spaces are maintained. Glenohumeral alignment
is suboptimally assessed. Remote right rib fracture.
IMPRESSION: Exam limited due to positioning. Suspect nondisplaced distal
clavicle fracture.

## 2018-06-13 DIAGNOSIS — M4800 Spinal stenosis, site unspecified: Secondary | ICD-10-CM | POA: Diagnosis not present

## 2018-06-13 DIAGNOSIS — M6281 Muscle weakness (generalized): Secondary | ICD-10-CM | POA: Diagnosis not present

## 2018-06-13 DIAGNOSIS — R293 Abnormal posture: Secondary | ICD-10-CM | POA: Diagnosis not present

## 2018-06-16 DIAGNOSIS — M6281 Muscle weakness (generalized): Secondary | ICD-10-CM | POA: Diagnosis not present

## 2018-06-16 DIAGNOSIS — R293 Abnormal posture: Secondary | ICD-10-CM | POA: Diagnosis not present

## 2018-06-16 DIAGNOSIS — M4800 Spinal stenosis, site unspecified: Secondary | ICD-10-CM | POA: Diagnosis not present

## 2018-06-17 DIAGNOSIS — R293 Abnormal posture: Secondary | ICD-10-CM | POA: Diagnosis not present

## 2018-06-17 DIAGNOSIS — M6281 Muscle weakness (generalized): Secondary | ICD-10-CM | POA: Diagnosis not present

## 2018-06-17 DIAGNOSIS — M4800 Spinal stenosis, site unspecified: Secondary | ICD-10-CM | POA: Diagnosis not present

## 2018-06-18 DIAGNOSIS — R293 Abnormal posture: Secondary | ICD-10-CM | POA: Diagnosis not present

## 2018-06-18 DIAGNOSIS — M6281 Muscle weakness (generalized): Secondary | ICD-10-CM | POA: Diagnosis not present

## 2018-06-18 DIAGNOSIS — M4800 Spinal stenosis, site unspecified: Secondary | ICD-10-CM | POA: Diagnosis not present

## 2018-06-19 DIAGNOSIS — M4800 Spinal stenosis, site unspecified: Secondary | ICD-10-CM | POA: Diagnosis not present

## 2018-06-19 DIAGNOSIS — R293 Abnormal posture: Secondary | ICD-10-CM | POA: Diagnosis not present

## 2018-06-19 DIAGNOSIS — M6281 Muscle weakness (generalized): Secondary | ICD-10-CM | POA: Diagnosis not present

## 2018-06-20 DIAGNOSIS — M6281 Muscle weakness (generalized): Secondary | ICD-10-CM | POA: Diagnosis not present

## 2018-06-20 DIAGNOSIS — M4800 Spinal stenosis, site unspecified: Secondary | ICD-10-CM | POA: Diagnosis not present

## 2018-06-20 DIAGNOSIS — R293 Abnormal posture: Secondary | ICD-10-CM | POA: Diagnosis not present

## 2018-06-23 DIAGNOSIS — M6281 Muscle weakness (generalized): Secondary | ICD-10-CM | POA: Diagnosis not present

## 2018-06-23 DIAGNOSIS — M4800 Spinal stenosis, site unspecified: Secondary | ICD-10-CM | POA: Diagnosis not present

## 2018-06-23 DIAGNOSIS — R293 Abnormal posture: Secondary | ICD-10-CM | POA: Diagnosis not present

## 2018-06-24 DIAGNOSIS — M6281 Muscle weakness (generalized): Secondary | ICD-10-CM | POA: Diagnosis not present

## 2018-06-24 DIAGNOSIS — M4800 Spinal stenosis, site unspecified: Secondary | ICD-10-CM | POA: Diagnosis not present

## 2018-06-24 DIAGNOSIS — R293 Abnormal posture: Secondary | ICD-10-CM | POA: Diagnosis not present

## 2018-06-25 DIAGNOSIS — M6281 Muscle weakness (generalized): Secondary | ICD-10-CM | POA: Diagnosis not present

## 2018-06-25 DIAGNOSIS — M4800 Spinal stenosis, site unspecified: Secondary | ICD-10-CM | POA: Diagnosis not present

## 2018-06-25 DIAGNOSIS — R293 Abnormal posture: Secondary | ICD-10-CM | POA: Diagnosis not present

## 2018-06-26 DIAGNOSIS — R293 Abnormal posture: Secondary | ICD-10-CM | POA: Diagnosis not present

## 2018-06-26 DIAGNOSIS — M6281 Muscle weakness (generalized): Secondary | ICD-10-CM | POA: Diagnosis not present

## 2018-06-26 DIAGNOSIS — M4800 Spinal stenosis, site unspecified: Secondary | ICD-10-CM | POA: Diagnosis not present

## 2018-06-27 DIAGNOSIS — M6281 Muscle weakness (generalized): Secondary | ICD-10-CM | POA: Diagnosis not present

## 2018-06-27 DIAGNOSIS — M4800 Spinal stenosis, site unspecified: Secondary | ICD-10-CM | POA: Diagnosis not present

## 2018-06-27 DIAGNOSIS — R293 Abnormal posture: Secondary | ICD-10-CM | POA: Diagnosis not present

## 2018-06-30 DIAGNOSIS — R293 Abnormal posture: Secondary | ICD-10-CM | POA: Diagnosis not present

## 2018-06-30 DIAGNOSIS — M4800 Spinal stenosis, site unspecified: Secondary | ICD-10-CM | POA: Diagnosis not present

## 2018-06-30 DIAGNOSIS — M6281 Muscle weakness (generalized): Secondary | ICD-10-CM | POA: Diagnosis not present

## 2018-07-01 DIAGNOSIS — R293 Abnormal posture: Secondary | ICD-10-CM | POA: Diagnosis not present

## 2018-07-01 DIAGNOSIS — M4800 Spinal stenosis, site unspecified: Secondary | ICD-10-CM | POA: Diagnosis not present

## 2018-07-01 DIAGNOSIS — M6281 Muscle weakness (generalized): Secondary | ICD-10-CM | POA: Diagnosis not present

## 2018-07-02 DIAGNOSIS — M4800 Spinal stenosis, site unspecified: Secondary | ICD-10-CM | POA: Diagnosis not present

## 2018-07-02 DIAGNOSIS — M6281 Muscle weakness (generalized): Secondary | ICD-10-CM | POA: Diagnosis not present

## 2018-07-02 DIAGNOSIS — R293 Abnormal posture: Secondary | ICD-10-CM | POA: Diagnosis not present

## 2018-07-03 DIAGNOSIS — R293 Abnormal posture: Secondary | ICD-10-CM | POA: Diagnosis not present

## 2018-07-03 DIAGNOSIS — M4800 Spinal stenosis, site unspecified: Secondary | ICD-10-CM | POA: Diagnosis not present

## 2018-07-03 DIAGNOSIS — M6281 Muscle weakness (generalized): Secondary | ICD-10-CM | POA: Diagnosis not present

## 2018-07-04 DIAGNOSIS — M6281 Muscle weakness (generalized): Secondary | ICD-10-CM | POA: Diagnosis not present

## 2018-07-04 DIAGNOSIS — R293 Abnormal posture: Secondary | ICD-10-CM | POA: Diagnosis not present

## 2018-07-04 DIAGNOSIS — M4800 Spinal stenosis, site unspecified: Secondary | ICD-10-CM | POA: Diagnosis not present

## 2018-07-07 DIAGNOSIS — K219 Gastro-esophageal reflux disease without esophagitis: Secondary | ICD-10-CM | POA: Diagnosis not present

## 2018-07-07 DIAGNOSIS — R293 Abnormal posture: Secondary | ICD-10-CM | POA: Diagnosis not present

## 2018-07-07 DIAGNOSIS — E039 Hypothyroidism, unspecified: Secondary | ICD-10-CM | POA: Diagnosis not present

## 2018-07-07 DIAGNOSIS — M4800 Spinal stenosis, site unspecified: Secondary | ICD-10-CM | POA: Diagnosis not present

## 2018-07-07 DIAGNOSIS — M6281 Muscle weakness (generalized): Secondary | ICD-10-CM | POA: Diagnosis not present

## 2018-07-07 DIAGNOSIS — D649 Anemia, unspecified: Secondary | ICD-10-CM | POA: Diagnosis not present

## 2018-07-07 DIAGNOSIS — E785 Hyperlipidemia, unspecified: Secondary | ICD-10-CM | POA: Diagnosis not present

## 2018-07-08 DIAGNOSIS — M6281 Muscle weakness (generalized): Secondary | ICD-10-CM | POA: Diagnosis not present

## 2018-07-08 DIAGNOSIS — M4800 Spinal stenosis, site unspecified: Secondary | ICD-10-CM | POA: Diagnosis not present

## 2018-07-08 DIAGNOSIS — R293 Abnormal posture: Secondary | ICD-10-CM | POA: Diagnosis not present

## 2018-07-09 DIAGNOSIS — M6281 Muscle weakness (generalized): Secondary | ICD-10-CM | POA: Diagnosis not present

## 2018-07-09 DIAGNOSIS — M4800 Spinal stenosis, site unspecified: Secondary | ICD-10-CM | POA: Diagnosis not present

## 2018-07-09 DIAGNOSIS — R293 Abnormal posture: Secondary | ICD-10-CM | POA: Diagnosis not present

## 2018-07-10 DIAGNOSIS — M4800 Spinal stenosis, site unspecified: Secondary | ICD-10-CM | POA: Diagnosis not present

## 2018-07-10 DIAGNOSIS — D509 Iron deficiency anemia, unspecified: Secondary | ICD-10-CM | POA: Diagnosis not present

## 2018-07-10 DIAGNOSIS — I10 Essential (primary) hypertension: Secondary | ICD-10-CM | POA: Diagnosis not present

## 2018-07-10 DIAGNOSIS — Z79899 Other long term (current) drug therapy: Secondary | ICD-10-CM | POA: Diagnosis not present

## 2018-07-10 DIAGNOSIS — D649 Anemia, unspecified: Secondary | ICD-10-CM | POA: Diagnosis not present

## 2018-07-10 DIAGNOSIS — M6281 Muscle weakness (generalized): Secondary | ICD-10-CM | POA: Diagnosis not present

## 2018-07-10 DIAGNOSIS — E039 Hypothyroidism, unspecified: Secondary | ICD-10-CM | POA: Diagnosis not present

## 2018-07-10 DIAGNOSIS — E559 Vitamin D deficiency, unspecified: Secondary | ICD-10-CM | POA: Diagnosis not present

## 2018-07-10 DIAGNOSIS — R293 Abnormal posture: Secondary | ICD-10-CM | POA: Diagnosis not present

## 2018-07-10 DIAGNOSIS — E785 Hyperlipidemia, unspecified: Secondary | ICD-10-CM | POA: Diagnosis not present

## 2018-07-11 DIAGNOSIS — R293 Abnormal posture: Secondary | ICD-10-CM | POA: Diagnosis not present

## 2018-07-11 DIAGNOSIS — M6281 Muscle weakness (generalized): Secondary | ICD-10-CM | POA: Diagnosis not present

## 2018-07-11 DIAGNOSIS — M4800 Spinal stenosis, site unspecified: Secondary | ICD-10-CM | POA: Diagnosis not present

## 2018-08-06 DIAGNOSIS — E785 Hyperlipidemia, unspecified: Secondary | ICD-10-CM | POA: Diagnosis not present

## 2018-08-06 DIAGNOSIS — E039 Hypothyroidism, unspecified: Secondary | ICD-10-CM | POA: Diagnosis not present

## 2018-08-06 DIAGNOSIS — F329 Major depressive disorder, single episode, unspecified: Secondary | ICD-10-CM | POA: Diagnosis not present

## 2018-08-07 DIAGNOSIS — E039 Hypothyroidism, unspecified: Secondary | ICD-10-CM | POA: Diagnosis not present

## 2018-08-14 DIAGNOSIS — E039 Hypothyroidism, unspecified: Secondary | ICD-10-CM | POA: Diagnosis not present

## 2018-09-15 DIAGNOSIS — R41841 Cognitive communication deficit: Secondary | ICD-10-CM | POA: Diagnosis not present

## 2018-09-15 DIAGNOSIS — J449 Chronic obstructive pulmonary disease, unspecified: Secondary | ICD-10-CM | POA: Diagnosis not present

## 2018-09-15 DIAGNOSIS — G9340 Encephalopathy, unspecified: Secondary | ICD-10-CM | POA: Diagnosis not present

## 2018-09-16 DIAGNOSIS — J449 Chronic obstructive pulmonary disease, unspecified: Secondary | ICD-10-CM | POA: Diagnosis not present

## 2018-09-16 DIAGNOSIS — R41841 Cognitive communication deficit: Secondary | ICD-10-CM | POA: Diagnosis not present

## 2018-09-16 DIAGNOSIS — G9340 Encephalopathy, unspecified: Secondary | ICD-10-CM | POA: Diagnosis not present

## 2018-09-17 DIAGNOSIS — J449 Chronic obstructive pulmonary disease, unspecified: Secondary | ICD-10-CM | POA: Diagnosis not present

## 2018-09-17 DIAGNOSIS — R41841 Cognitive communication deficit: Secondary | ICD-10-CM | POA: Diagnosis not present

## 2018-09-17 DIAGNOSIS — G9340 Encephalopathy, unspecified: Secondary | ICD-10-CM | POA: Diagnosis not present

## 2018-09-18 DIAGNOSIS — J449 Chronic obstructive pulmonary disease, unspecified: Secondary | ICD-10-CM | POA: Diagnosis not present

## 2018-09-18 DIAGNOSIS — G9340 Encephalopathy, unspecified: Secondary | ICD-10-CM | POA: Diagnosis not present

## 2018-09-18 DIAGNOSIS — R41841 Cognitive communication deficit: Secondary | ICD-10-CM | POA: Diagnosis not present

## 2018-09-19 DIAGNOSIS — J449 Chronic obstructive pulmonary disease, unspecified: Secondary | ICD-10-CM | POA: Diagnosis not present

## 2018-09-19 DIAGNOSIS — R41841 Cognitive communication deficit: Secondary | ICD-10-CM | POA: Diagnosis not present

## 2018-09-19 DIAGNOSIS — G9340 Encephalopathy, unspecified: Secondary | ICD-10-CM | POA: Diagnosis not present

## 2018-09-22 DIAGNOSIS — G9340 Encephalopathy, unspecified: Secondary | ICD-10-CM | POA: Diagnosis not present

## 2018-09-22 DIAGNOSIS — R41841 Cognitive communication deficit: Secondary | ICD-10-CM | POA: Diagnosis not present

## 2018-09-22 DIAGNOSIS — J449 Chronic obstructive pulmonary disease, unspecified: Secondary | ICD-10-CM | POA: Diagnosis not present

## 2018-09-23 DIAGNOSIS — R471 Dysarthria and anarthria: Secondary | ICD-10-CM | POA: Diagnosis not present

## 2018-09-23 DIAGNOSIS — R413 Other amnesia: Secondary | ICD-10-CM | POA: Diagnosis not present

## 2018-09-23 DIAGNOSIS — F79 Unspecified intellectual disabilities: Secondary | ICD-10-CM | POA: Diagnosis not present

## 2018-09-23 DIAGNOSIS — R41841 Cognitive communication deficit: Secondary | ICD-10-CM | POA: Diagnosis not present

## 2018-09-23 DIAGNOSIS — J449 Chronic obstructive pulmonary disease, unspecified: Secondary | ICD-10-CM | POA: Diagnosis not present

## 2018-09-23 DIAGNOSIS — I6990 Unspecified sequelae of unspecified cerebrovascular disease: Secondary | ICD-10-CM | POA: Diagnosis not present

## 2018-09-23 DIAGNOSIS — G9340 Encephalopathy, unspecified: Secondary | ICD-10-CM | POA: Diagnosis not present

## 2018-09-24 DIAGNOSIS — N184 Chronic kidney disease, stage 4 (severe): Secondary | ICD-10-CM | POA: Diagnosis not present

## 2018-09-24 DIAGNOSIS — J449 Chronic obstructive pulmonary disease, unspecified: Secondary | ICD-10-CM | POA: Diagnosis not present

## 2018-09-24 DIAGNOSIS — G9340 Encephalopathy, unspecified: Secondary | ICD-10-CM | POA: Diagnosis not present

## 2018-09-24 DIAGNOSIS — R41841 Cognitive communication deficit: Secondary | ICD-10-CM | POA: Diagnosis not present

## 2018-09-24 DIAGNOSIS — D649 Anemia, unspecified: Secondary | ICD-10-CM | POA: Diagnosis not present

## 2018-09-25 DIAGNOSIS — G9340 Encephalopathy, unspecified: Secondary | ICD-10-CM | POA: Diagnosis not present

## 2018-09-25 DIAGNOSIS — R41841 Cognitive communication deficit: Secondary | ICD-10-CM | POA: Diagnosis not present

## 2018-09-25 DIAGNOSIS — J449 Chronic obstructive pulmonary disease, unspecified: Secondary | ICD-10-CM | POA: Diagnosis not present

## 2018-09-26 DIAGNOSIS — E039 Hypothyroidism, unspecified: Secondary | ICD-10-CM | POA: Diagnosis not present

## 2018-09-26 DIAGNOSIS — G9340 Encephalopathy, unspecified: Secondary | ICD-10-CM | POA: Diagnosis not present

## 2018-09-26 DIAGNOSIS — R41841 Cognitive communication deficit: Secondary | ICD-10-CM | POA: Diagnosis not present

## 2018-09-26 DIAGNOSIS — J449 Chronic obstructive pulmonary disease, unspecified: Secondary | ICD-10-CM | POA: Diagnosis not present

## 2018-09-29 DIAGNOSIS — J449 Chronic obstructive pulmonary disease, unspecified: Secondary | ICD-10-CM | POA: Diagnosis not present

## 2018-09-29 DIAGNOSIS — R41841 Cognitive communication deficit: Secondary | ICD-10-CM | POA: Diagnosis not present

## 2018-09-29 DIAGNOSIS — E039 Hypothyroidism, unspecified: Secondary | ICD-10-CM | POA: Diagnosis not present

## 2018-09-29 DIAGNOSIS — G9340 Encephalopathy, unspecified: Secondary | ICD-10-CM | POA: Diagnosis not present

## 2018-09-30 DIAGNOSIS — G9340 Encephalopathy, unspecified: Secondary | ICD-10-CM | POA: Diagnosis not present

## 2018-09-30 DIAGNOSIS — J449 Chronic obstructive pulmonary disease, unspecified: Secondary | ICD-10-CM | POA: Diagnosis not present

## 2018-09-30 DIAGNOSIS — R41841 Cognitive communication deficit: Secondary | ICD-10-CM | POA: Diagnosis not present

## 2018-10-01 DIAGNOSIS — R41841 Cognitive communication deficit: Secondary | ICD-10-CM | POA: Diagnosis not present

## 2018-10-01 DIAGNOSIS — G9340 Encephalopathy, unspecified: Secondary | ICD-10-CM | POA: Diagnosis not present

## 2018-10-01 DIAGNOSIS — J449 Chronic obstructive pulmonary disease, unspecified: Secondary | ICD-10-CM | POA: Diagnosis not present

## 2018-10-02 DIAGNOSIS — G9340 Encephalopathy, unspecified: Secondary | ICD-10-CM | POA: Diagnosis not present

## 2018-10-02 DIAGNOSIS — R41841 Cognitive communication deficit: Secondary | ICD-10-CM | POA: Diagnosis not present

## 2018-10-02 DIAGNOSIS — J449 Chronic obstructive pulmonary disease, unspecified: Secondary | ICD-10-CM | POA: Diagnosis not present

## 2018-10-03 DIAGNOSIS — G9340 Encephalopathy, unspecified: Secondary | ICD-10-CM | POA: Diagnosis not present

## 2018-10-03 DIAGNOSIS — R41841 Cognitive communication deficit: Secondary | ICD-10-CM | POA: Diagnosis not present

## 2018-10-03 DIAGNOSIS — J449 Chronic obstructive pulmonary disease, unspecified: Secondary | ICD-10-CM | POA: Diagnosis not present

## 2018-10-06 DIAGNOSIS — R41841 Cognitive communication deficit: Secondary | ICD-10-CM | POA: Diagnosis not present

## 2018-10-06 DIAGNOSIS — J449 Chronic obstructive pulmonary disease, unspecified: Secondary | ICD-10-CM | POA: Diagnosis not present

## 2018-10-06 DIAGNOSIS — G9340 Encephalopathy, unspecified: Secondary | ICD-10-CM | POA: Diagnosis not present

## 2018-10-07 DIAGNOSIS — R41841 Cognitive communication deficit: Secondary | ICD-10-CM | POA: Diagnosis not present

## 2018-10-07 DIAGNOSIS — G9340 Encephalopathy, unspecified: Secondary | ICD-10-CM | POA: Diagnosis not present

## 2018-10-07 DIAGNOSIS — J449 Chronic obstructive pulmonary disease, unspecified: Secondary | ICD-10-CM | POA: Diagnosis not present

## 2018-10-08 DIAGNOSIS — J449 Chronic obstructive pulmonary disease, unspecified: Secondary | ICD-10-CM | POA: Diagnosis not present

## 2018-10-08 DIAGNOSIS — G9340 Encephalopathy, unspecified: Secondary | ICD-10-CM | POA: Diagnosis not present

## 2018-10-08 DIAGNOSIS — R41841 Cognitive communication deficit: Secondary | ICD-10-CM | POA: Diagnosis not present

## 2018-10-09 DIAGNOSIS — R41841 Cognitive communication deficit: Secondary | ICD-10-CM | POA: Diagnosis not present

## 2018-10-09 DIAGNOSIS — K219 Gastro-esophageal reflux disease without esophagitis: Secondary | ICD-10-CM | POA: Diagnosis not present

## 2018-10-09 DIAGNOSIS — J449 Chronic obstructive pulmonary disease, unspecified: Secondary | ICD-10-CM | POA: Diagnosis not present

## 2018-10-09 DIAGNOSIS — G9340 Encephalopathy, unspecified: Secondary | ICD-10-CM | POA: Diagnosis not present

## 2018-10-10 DIAGNOSIS — J449 Chronic obstructive pulmonary disease, unspecified: Secondary | ICD-10-CM | POA: Diagnosis not present

## 2018-10-10 DIAGNOSIS — G9340 Encephalopathy, unspecified: Secondary | ICD-10-CM | POA: Diagnosis not present

## 2018-10-10 DIAGNOSIS — R41841 Cognitive communication deficit: Secondary | ICD-10-CM | POA: Diagnosis not present

## 2018-10-13 DIAGNOSIS — R41841 Cognitive communication deficit: Secondary | ICD-10-CM | POA: Diagnosis not present

## 2018-10-13 DIAGNOSIS — G9340 Encephalopathy, unspecified: Secondary | ICD-10-CM | POA: Diagnosis not present

## 2018-10-13 DIAGNOSIS — J449 Chronic obstructive pulmonary disease, unspecified: Secondary | ICD-10-CM | POA: Diagnosis not present

## 2018-10-14 DIAGNOSIS — R41841 Cognitive communication deficit: Secondary | ICD-10-CM | POA: Diagnosis not present

## 2018-10-14 DIAGNOSIS — G9340 Encephalopathy, unspecified: Secondary | ICD-10-CM | POA: Diagnosis not present

## 2018-10-14 DIAGNOSIS — J449 Chronic obstructive pulmonary disease, unspecified: Secondary | ICD-10-CM | POA: Diagnosis not present

## 2018-10-16 ENCOUNTER — Other Ambulatory Visit: Payer: Self-pay

## 2018-10-27 DIAGNOSIS — D649 Anemia, unspecified: Secondary | ICD-10-CM | POA: Diagnosis not present

## 2018-10-27 DIAGNOSIS — K219 Gastro-esophageal reflux disease without esophagitis: Secondary | ICD-10-CM | POA: Diagnosis not present

## 2018-10-27 DIAGNOSIS — E785 Hyperlipidemia, unspecified: Secondary | ICD-10-CM | POA: Diagnosis not present

## 2018-10-27 DIAGNOSIS — E559 Vitamin D deficiency, unspecified: Secondary | ICD-10-CM | POA: Diagnosis not present

## 2018-10-31 DIAGNOSIS — Z20828 Contact with and (suspected) exposure to other viral communicable diseases: Secondary | ICD-10-CM | POA: Diagnosis not present

## 2018-11-10 DIAGNOSIS — G8929 Other chronic pain: Secondary | ICD-10-CM | POA: Diagnosis not present

## 2018-11-10 DIAGNOSIS — E039 Hypothyroidism, unspecified: Secondary | ICD-10-CM | POA: Diagnosis not present

## 2018-11-10 DIAGNOSIS — F0390 Unspecified dementia without behavioral disturbance: Secondary | ICD-10-CM | POA: Diagnosis not present

## 2018-11-12 DIAGNOSIS — S30820D Blister (nonthermal) of lower back and pelvis, subsequent encounter: Secondary | ICD-10-CM | POA: Diagnosis not present

## 2018-11-12 DIAGNOSIS — Z7409 Other reduced mobility: Secondary | ICD-10-CM | POA: Diagnosis not present

## 2018-11-12 DIAGNOSIS — M6281 Muscle weakness (generalized): Secondary | ICD-10-CM | POA: Diagnosis not present

## 2018-11-19 DIAGNOSIS — M6281 Muscle weakness (generalized): Secondary | ICD-10-CM | POA: Diagnosis not present

## 2018-11-19 DIAGNOSIS — S30820D Blister (nonthermal) of lower back and pelvis, subsequent encounter: Secondary | ICD-10-CM | POA: Diagnosis not present

## 2018-11-19 DIAGNOSIS — Z7409 Other reduced mobility: Secondary | ICD-10-CM | POA: Diagnosis not present

## 2018-11-26 DIAGNOSIS — M4800 Spinal stenosis, site unspecified: Secondary | ICD-10-CM | POA: Diagnosis not present

## 2018-11-26 DIAGNOSIS — F0390 Unspecified dementia without behavioral disturbance: Secondary | ICD-10-CM | POA: Diagnosis not present

## 2018-11-26 DIAGNOSIS — E039 Hypothyroidism, unspecified: Secondary | ICD-10-CM | POA: Diagnosis not present

## 2018-11-28 DIAGNOSIS — Z7409 Other reduced mobility: Secondary | ICD-10-CM | POA: Diagnosis not present

## 2018-11-28 DIAGNOSIS — S30820D Blister (nonthermal) of lower back and pelvis, subsequent encounter: Secondary | ICD-10-CM | POA: Diagnosis not present

## 2018-11-28 DIAGNOSIS — M6281 Muscle weakness (generalized): Secondary | ICD-10-CM | POA: Diagnosis not present

## 2018-12-01 DIAGNOSIS — Z20828 Contact with and (suspected) exposure to other viral communicable diseases: Secondary | ICD-10-CM | POA: Diagnosis not present

## 2018-12-08 DIAGNOSIS — Z20828 Contact with and (suspected) exposure to other viral communicable diseases: Secondary | ICD-10-CM | POA: Diagnosis not present

## 2018-12-15 DIAGNOSIS — Z20828 Contact with and (suspected) exposure to other viral communicable diseases: Secondary | ICD-10-CM | POA: Diagnosis not present

## 2018-12-22 DIAGNOSIS — D509 Iron deficiency anemia, unspecified: Secondary | ICD-10-CM | POA: Diagnosis not present

## 2018-12-22 DIAGNOSIS — N184 Chronic kidney disease, stage 4 (severe): Secondary | ICD-10-CM | POA: Diagnosis not present

## 2018-12-22 DIAGNOSIS — E559 Vitamin D deficiency, unspecified: Secondary | ICD-10-CM | POA: Diagnosis not present

## 2018-12-22 DIAGNOSIS — E785 Hyperlipidemia, unspecified: Secondary | ICD-10-CM | POA: Diagnosis not present

## 2018-12-22 DIAGNOSIS — E782 Mixed hyperlipidemia: Secondary | ICD-10-CM | POA: Diagnosis not present

## 2018-12-22 DIAGNOSIS — D649 Anemia, unspecified: Secondary | ICD-10-CM | POA: Diagnosis not present

## 2018-12-22 DIAGNOSIS — E039 Hypothyroidism, unspecified: Secondary | ICD-10-CM | POA: Diagnosis not present

## 2018-12-23 DIAGNOSIS — Z20828 Contact with and (suspected) exposure to other viral communicable diseases: Secondary | ICD-10-CM | POA: Diagnosis not present

## 2018-12-26 DIAGNOSIS — Z20828 Contact with and (suspected) exposure to other viral communicable diseases: Secondary | ICD-10-CM | POA: Diagnosis not present

## 2018-12-30 DIAGNOSIS — E039 Hypothyroidism, unspecified: Secondary | ICD-10-CM | POA: Diagnosis not present

## 2018-12-30 DIAGNOSIS — D649 Anemia, unspecified: Secondary | ICD-10-CM | POA: Diagnosis not present

## 2018-12-30 DIAGNOSIS — E785 Hyperlipidemia, unspecified: Secondary | ICD-10-CM | POA: Diagnosis not present

## 2018-12-30 DIAGNOSIS — E559 Vitamin D deficiency, unspecified: Secondary | ICD-10-CM | POA: Diagnosis not present

## 2018-12-31 DIAGNOSIS — G9340 Encephalopathy, unspecified: Secondary | ICD-10-CM | POA: Diagnosis not present

## 2018-12-31 DIAGNOSIS — R41841 Cognitive communication deficit: Secondary | ICD-10-CM | POA: Diagnosis not present

## 2018-12-31 DIAGNOSIS — Z20828 Contact with and (suspected) exposure to other viral communicable diseases: Secondary | ICD-10-CM | POA: Diagnosis not present

## 2018-12-31 DIAGNOSIS — J449 Chronic obstructive pulmonary disease, unspecified: Secondary | ICD-10-CM | POA: Diagnosis not present

## 2019-01-01 DIAGNOSIS — R41841 Cognitive communication deficit: Secondary | ICD-10-CM | POA: Diagnosis not present

## 2019-01-01 DIAGNOSIS — G9340 Encephalopathy, unspecified: Secondary | ICD-10-CM | POA: Diagnosis not present

## 2019-01-01 DIAGNOSIS — J449 Chronic obstructive pulmonary disease, unspecified: Secondary | ICD-10-CM | POA: Diagnosis not present

## 2019-01-02 DIAGNOSIS — R41841 Cognitive communication deficit: Secondary | ICD-10-CM | POA: Diagnosis not present

## 2019-01-02 DIAGNOSIS — G9340 Encephalopathy, unspecified: Secondary | ICD-10-CM | POA: Diagnosis not present

## 2019-01-02 DIAGNOSIS — J449 Chronic obstructive pulmonary disease, unspecified: Secondary | ICD-10-CM | POA: Diagnosis not present

## 2019-01-04 DIAGNOSIS — Z20828 Contact with and (suspected) exposure to other viral communicable diseases: Secondary | ICD-10-CM | POA: Diagnosis not present

## 2019-01-05 DIAGNOSIS — J449 Chronic obstructive pulmonary disease, unspecified: Secondary | ICD-10-CM | POA: Diagnosis not present

## 2019-01-05 DIAGNOSIS — R41841 Cognitive communication deficit: Secondary | ICD-10-CM | POA: Diagnosis not present

## 2019-01-05 DIAGNOSIS — G9340 Encephalopathy, unspecified: Secondary | ICD-10-CM | POA: Diagnosis not present

## 2019-01-06 DIAGNOSIS — G9340 Encephalopathy, unspecified: Secondary | ICD-10-CM | POA: Diagnosis not present

## 2019-01-06 DIAGNOSIS — R41841 Cognitive communication deficit: Secondary | ICD-10-CM | POA: Diagnosis not present

## 2019-01-06 DIAGNOSIS — J449 Chronic obstructive pulmonary disease, unspecified: Secondary | ICD-10-CM | POA: Diagnosis not present

## 2019-01-07 DIAGNOSIS — G9340 Encephalopathy, unspecified: Secondary | ICD-10-CM | POA: Diagnosis not present

## 2019-01-07 DIAGNOSIS — J449 Chronic obstructive pulmonary disease, unspecified: Secondary | ICD-10-CM | POA: Diagnosis not present

## 2019-01-07 DIAGNOSIS — R41841 Cognitive communication deficit: Secondary | ICD-10-CM | POA: Diagnosis not present

## 2019-01-08 DIAGNOSIS — R41841 Cognitive communication deficit: Secondary | ICD-10-CM | POA: Diagnosis not present

## 2019-01-08 DIAGNOSIS — G9340 Encephalopathy, unspecified: Secondary | ICD-10-CM | POA: Diagnosis not present

## 2019-01-08 DIAGNOSIS — J449 Chronic obstructive pulmonary disease, unspecified: Secondary | ICD-10-CM | POA: Diagnosis not present

## 2019-01-09 DIAGNOSIS — J449 Chronic obstructive pulmonary disease, unspecified: Secondary | ICD-10-CM | POA: Diagnosis not present

## 2019-01-09 DIAGNOSIS — G9340 Encephalopathy, unspecified: Secondary | ICD-10-CM | POA: Diagnosis not present

## 2019-01-09 DIAGNOSIS — R41841 Cognitive communication deficit: Secondary | ICD-10-CM | POA: Diagnosis not present

## 2019-01-12 DIAGNOSIS — G9340 Encephalopathy, unspecified: Secondary | ICD-10-CM | POA: Diagnosis not present

## 2019-01-12 DIAGNOSIS — J449 Chronic obstructive pulmonary disease, unspecified: Secondary | ICD-10-CM | POA: Diagnosis not present

## 2019-01-12 DIAGNOSIS — Z20828 Contact with and (suspected) exposure to other viral communicable diseases: Secondary | ICD-10-CM | POA: Diagnosis not present

## 2019-01-12 DIAGNOSIS — R41841 Cognitive communication deficit: Secondary | ICD-10-CM | POA: Diagnosis not present

## 2019-01-13 DIAGNOSIS — R41841 Cognitive communication deficit: Secondary | ICD-10-CM | POA: Diagnosis not present

## 2019-01-13 DIAGNOSIS — J449 Chronic obstructive pulmonary disease, unspecified: Secondary | ICD-10-CM | POA: Diagnosis not present

## 2019-01-13 DIAGNOSIS — G9340 Encephalopathy, unspecified: Secondary | ICD-10-CM | POA: Diagnosis not present

## 2019-01-14 DIAGNOSIS — J449 Chronic obstructive pulmonary disease, unspecified: Secondary | ICD-10-CM | POA: Diagnosis not present

## 2019-01-14 DIAGNOSIS — G9340 Encephalopathy, unspecified: Secondary | ICD-10-CM | POA: Diagnosis not present

## 2019-01-14 DIAGNOSIS — R41841 Cognitive communication deficit: Secondary | ICD-10-CM | POA: Diagnosis not present

## 2019-01-15 DIAGNOSIS — R41841 Cognitive communication deficit: Secondary | ICD-10-CM | POA: Diagnosis not present

## 2019-01-15 DIAGNOSIS — J449 Chronic obstructive pulmonary disease, unspecified: Secondary | ICD-10-CM | POA: Diagnosis not present

## 2019-01-15 DIAGNOSIS — G9340 Encephalopathy, unspecified: Secondary | ICD-10-CM | POA: Diagnosis not present

## 2019-01-17 DIAGNOSIS — Z20828 Contact with and (suspected) exposure to other viral communicable diseases: Secondary | ICD-10-CM | POA: Diagnosis not present

## 2019-01-18 DIAGNOSIS — J449 Chronic obstructive pulmonary disease, unspecified: Secondary | ICD-10-CM | POA: Diagnosis not present

## 2019-01-18 DIAGNOSIS — G9340 Encephalopathy, unspecified: Secondary | ICD-10-CM | POA: Diagnosis not present

## 2019-01-18 DIAGNOSIS — R41841 Cognitive communication deficit: Secondary | ICD-10-CM | POA: Diagnosis not present

## 2019-01-20 DIAGNOSIS — R41841 Cognitive communication deficit: Secondary | ICD-10-CM | POA: Diagnosis not present

## 2019-01-20 DIAGNOSIS — G9340 Encephalopathy, unspecified: Secondary | ICD-10-CM | POA: Diagnosis not present

## 2019-01-20 DIAGNOSIS — J449 Chronic obstructive pulmonary disease, unspecified: Secondary | ICD-10-CM | POA: Diagnosis not present

## 2019-01-21 DIAGNOSIS — J449 Chronic obstructive pulmonary disease, unspecified: Secondary | ICD-10-CM | POA: Diagnosis not present

## 2019-01-21 DIAGNOSIS — R41841 Cognitive communication deficit: Secondary | ICD-10-CM | POA: Diagnosis not present

## 2019-01-21 DIAGNOSIS — G9340 Encephalopathy, unspecified: Secondary | ICD-10-CM | POA: Diagnosis not present

## 2019-01-22 ENCOUNTER — Ambulatory Visit (INDEPENDENT_AMBULATORY_CARE_PROVIDER_SITE_OTHER): Payer: Medicare Other | Admitting: Otolaryngology

## 2019-01-22 DIAGNOSIS — G9340 Encephalopathy, unspecified: Secondary | ICD-10-CM | POA: Diagnosis not present

## 2019-01-22 DIAGNOSIS — R41841 Cognitive communication deficit: Secondary | ICD-10-CM | POA: Diagnosis not present

## 2019-01-22 DIAGNOSIS — J449 Chronic obstructive pulmonary disease, unspecified: Secondary | ICD-10-CM | POA: Diagnosis not present

## 2019-01-23 DIAGNOSIS — J449 Chronic obstructive pulmonary disease, unspecified: Secondary | ICD-10-CM | POA: Diagnosis not present

## 2019-01-23 DIAGNOSIS — G9340 Encephalopathy, unspecified: Secondary | ICD-10-CM | POA: Diagnosis not present

## 2019-01-23 DIAGNOSIS — R41841 Cognitive communication deficit: Secondary | ICD-10-CM | POA: Diagnosis not present

## 2019-01-28 DIAGNOSIS — B351 Tinea unguium: Secondary | ICD-10-CM | POA: Diagnosis not present

## 2019-01-28 DIAGNOSIS — M79675 Pain in left toe(s): Secondary | ICD-10-CM | POA: Diagnosis not present

## 2019-01-28 DIAGNOSIS — M79674 Pain in right toe(s): Secondary | ICD-10-CM | POA: Diagnosis not present

## 2019-02-08 DIAGNOSIS — R05 Cough: Secondary | ICD-10-CM | POA: Diagnosis not present

## 2019-02-16 DIAGNOSIS — K219 Gastro-esophageal reflux disease without esophagitis: Secondary | ICD-10-CM | POA: Diagnosis not present

## 2019-02-16 DIAGNOSIS — D649 Anemia, unspecified: Secondary | ICD-10-CM | POA: Diagnosis not present

## 2019-02-16 DIAGNOSIS — J449 Chronic obstructive pulmonary disease, unspecified: Secondary | ICD-10-CM | POA: Diagnosis not present

## 2019-02-16 DIAGNOSIS — E785 Hyperlipidemia, unspecified: Secondary | ICD-10-CM | POA: Diagnosis not present

## 2019-03-06 IMAGING — MR MR HIP*L* W/O CM
4 of 6 series · 11 of 40 positions shown · non-contrast
Comparison: 01/25/2015 radiographs of the pelvis and both hips, MRI
10/13/2013

CLINICAL DATA: Removal of left hip arthroplasty, after care
followup.

EXAM:
MR OF THE LEFT HIP WITHOUT CONTRAST
TECHNIQUE: Multiplanar, multisequence MR imaging was performed. No intravenous
contrast was administered.

[Series 4: t1_ir_cor · coronal · 4.5mm · 0.62mm/px · 3 of 28 slices shown]
[im 1/28]
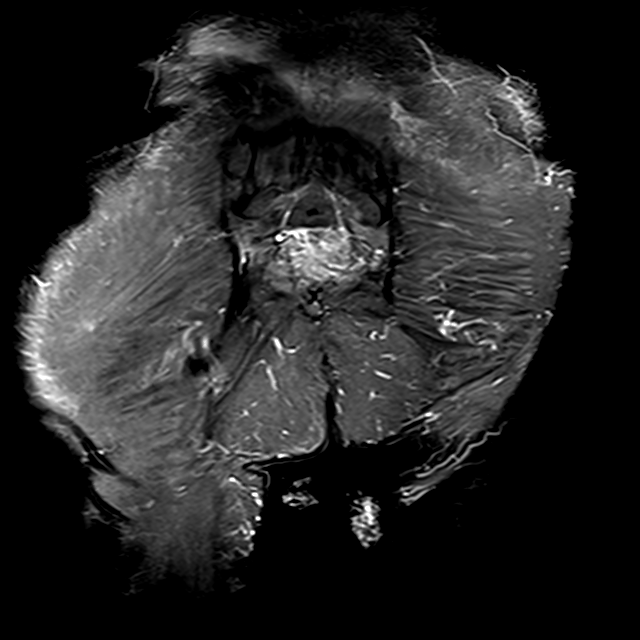
[im 14/28]
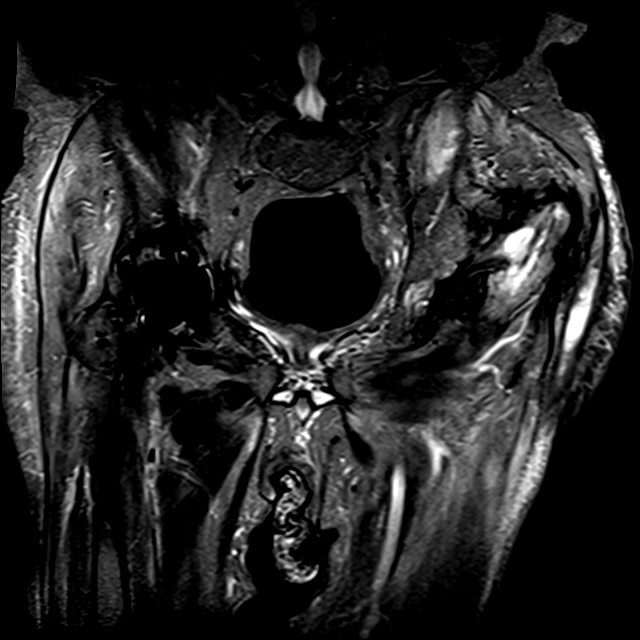
[im 28/28]
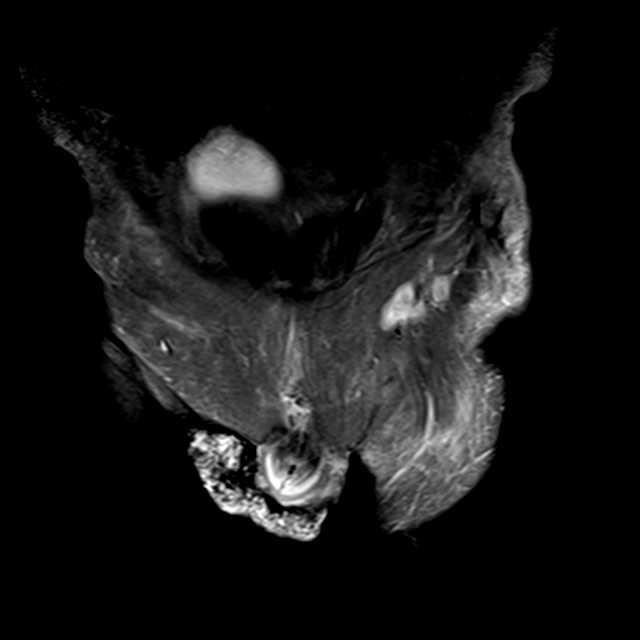

[Series 5: t1_tse_cor · coronal · 4.5mm · 0.52mm/px · 3 of 28 slices shown]
[im 1/28]
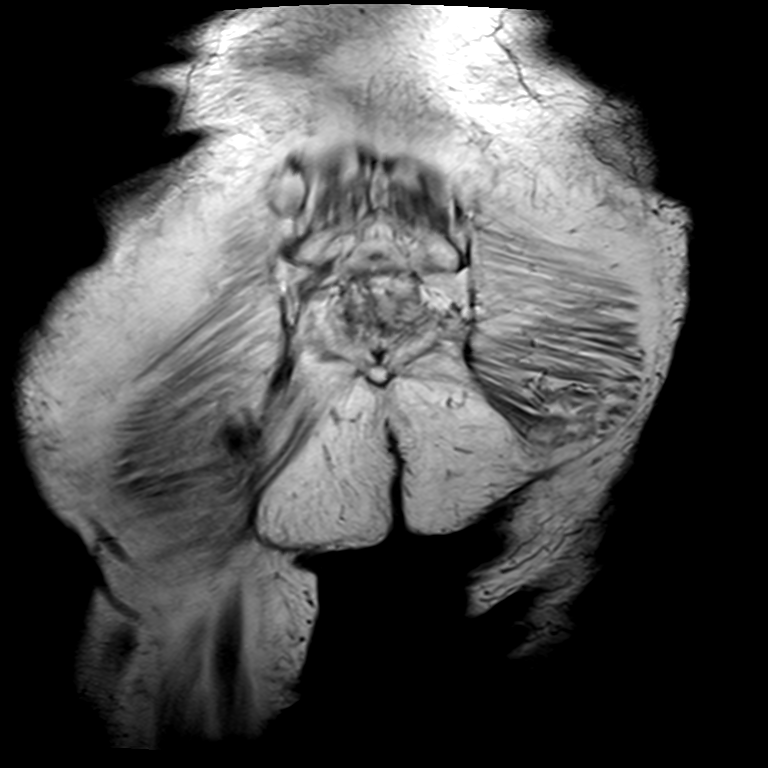
[im 14/28]
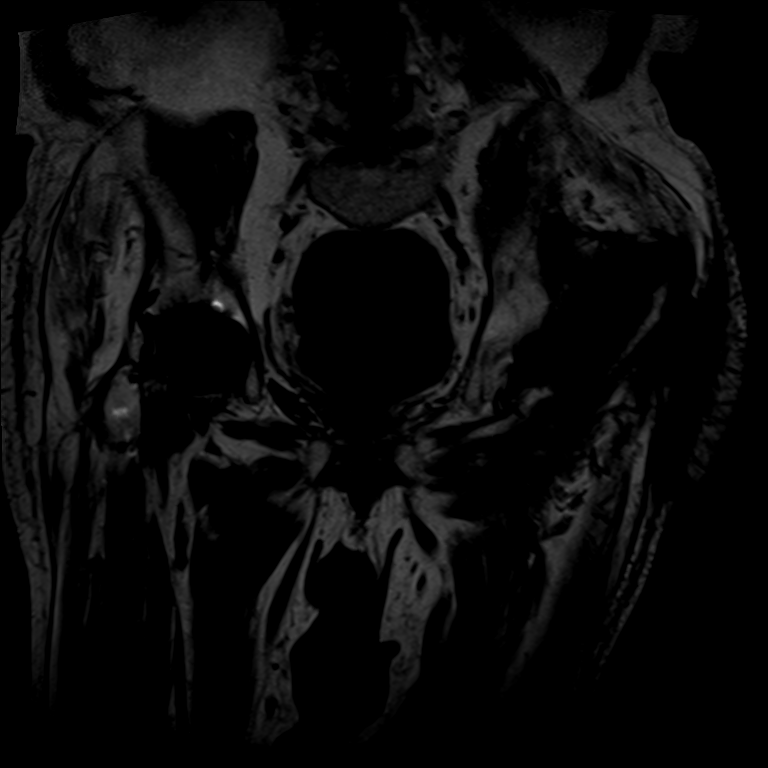
[im 28/28]
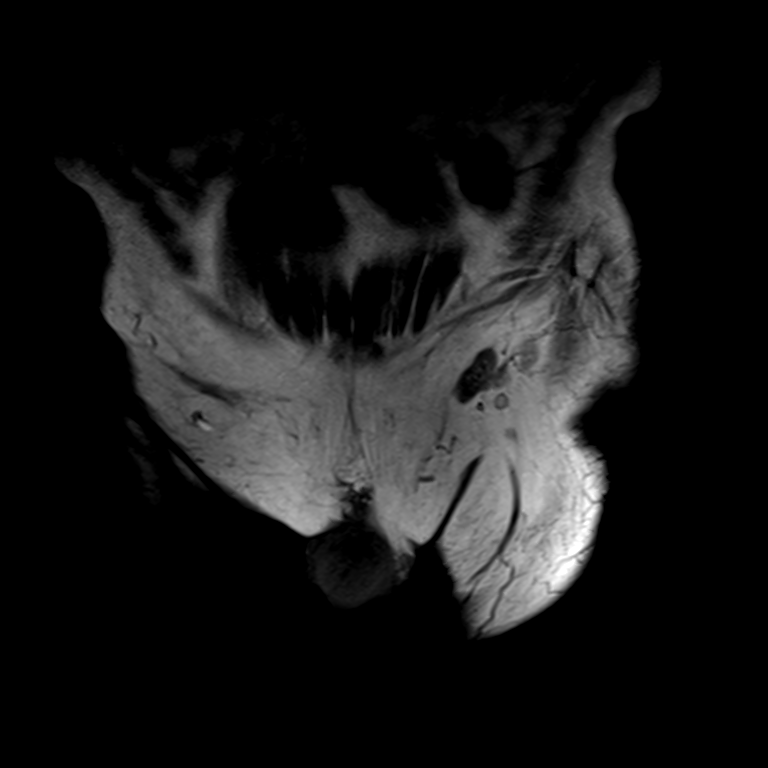

[Series 6: ir axial · axial · 5.0mm · 1.38mm/px · z∈[+1,+165]mm · 3 of 40 slices shown]
[im 6/40]
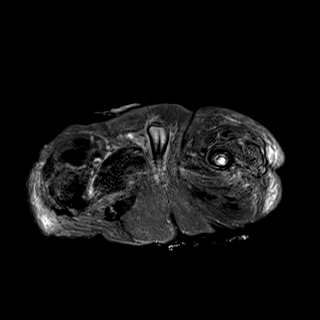
[im 23/40]
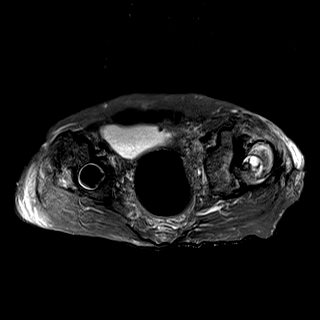
[im 34/40]
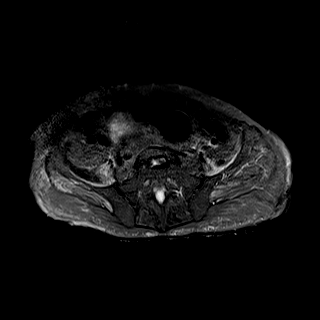

[Series 7: t2_tse_tra · axial · 5.0mm · 0.49mm/px · z∈[+1,+101]mm · 2 of 40 slices shown]
[im 6/40]
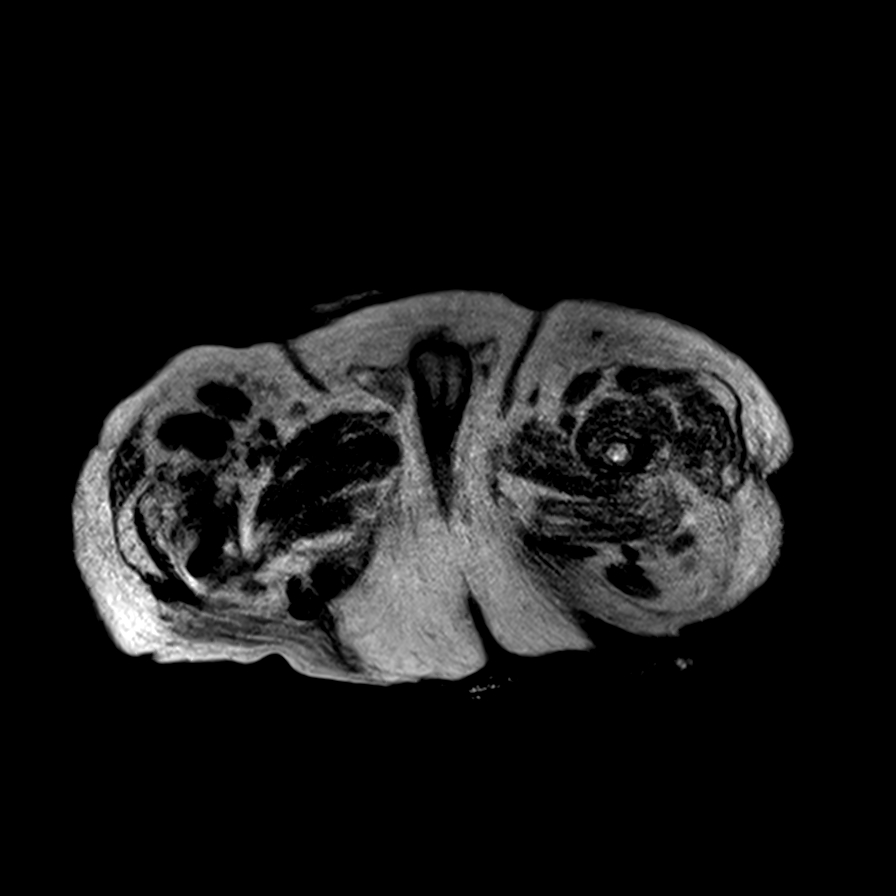
[im 23/40]
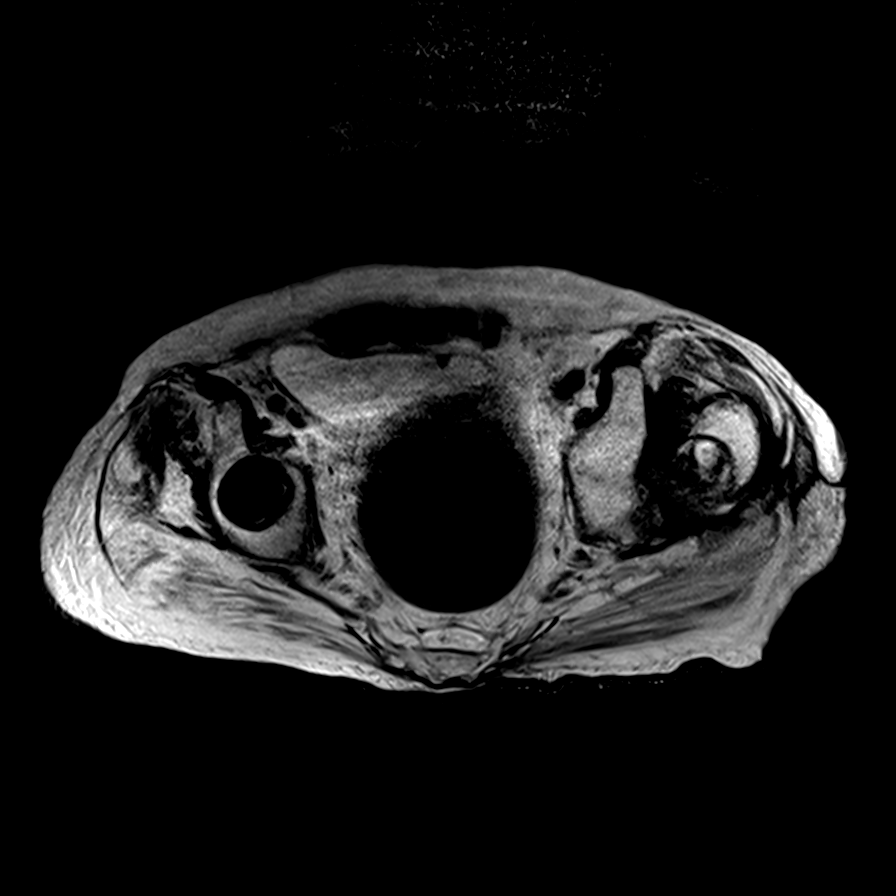

[11 of 40 positions shown; findings below may reference images not displayed]

FINDINGS: Bones: The patient has undergone Massayoshi Medrado procedure on the left
with removal of pre-existing arthroplasty. Reactive marrow edema
secondary to reaming within the medullary compartment of the
proximal left femur is noted as well as postop reactive edema at the
remaining trochanteric portion. Susceptibility artifact limits
assessment of the native right hip. No findings of acute fracture
nor frank bone destruction. No definite evidence of osteomyelitis.

Articular cartilage and labrum

Articular cartilage:  Not applicable

Labrum:  Not applicable

Joint or bursal effusion

Joint effusion:  No significant joint effusion identified.

Bursae:  No bursal fluid.

Muscles and tendons

Muscles and tendons: Muscular atrophy about both hips more so
involving the gluteal muscles.

Other findings

Miscellaneous: Subcutaneous edema is seen about both greater
trochanters bilaterally with small crescentic postop fluid
collection overlying the lateral aspect of the left hip measuring
2.9 x 1.3 x 9.7 cm overlying the left gluteus medius and vastus
lateralis.

Large stool burden in the rectal vault.
IMPRESSION: 1. Status Saku Tammaro procedure of the left hip with postop
change in the overlying soft tissues as above. Small amount of fluid
is seen overlying the vastus lateralis and gluteus medius on the
left likely postop.
2. Mild soft tissue edema overlies the lateral aspect of the right
hip. Susceptibility artifact from the right hip known total hip
arthroplasty.

## 2019-03-15 DIAGNOSIS — Z89622 Acquired absence of left hip joint: Secondary | ICD-10-CM | POA: Diagnosis not present

## 2019-03-15 DIAGNOSIS — Z96642 Presence of left artificial hip joint: Secondary | ICD-10-CM | POA: Diagnosis present

## 2019-03-15 DIAGNOSIS — E785 Hyperlipidemia, unspecified: Secondary | ICD-10-CM | POA: Diagnosis present

## 2019-03-15 DIAGNOSIS — G9340 Encephalopathy, unspecified: Secondary | ICD-10-CM | POA: Diagnosis present

## 2019-03-15 DIAGNOSIS — J309 Allergic rhinitis, unspecified: Secondary | ICD-10-CM | POA: Diagnosis present

## 2019-03-15 DIAGNOSIS — F09 Unspecified mental disorder due to known physiological condition: Secondary | ICD-10-CM | POA: Diagnosis present

## 2019-03-15 DIAGNOSIS — R1310 Dysphagia, unspecified: Secondary | ICD-10-CM | POA: Diagnosis present

## 2019-03-15 DIAGNOSIS — E039 Hypothyroidism, unspecified: Secondary | ICD-10-CM | POA: Diagnosis present

## 2019-03-15 DIAGNOSIS — H9193 Unspecified hearing loss, bilateral: Secondary | ICD-10-CM | POA: Diagnosis present

## 2019-03-15 DIAGNOSIS — K5900 Constipation, unspecified: Secondary | ICD-10-CM | POA: Diagnosis present

## 2019-03-15 DIAGNOSIS — E162 Hypoglycemia, unspecified: Secondary | ICD-10-CM | POA: Diagnosis not present

## 2019-03-15 DIAGNOSIS — F419 Anxiety disorder, unspecified: Secondary | ICD-10-CM | POA: Diagnosis present

## 2019-03-15 DIAGNOSIS — E568 Deficiency of other vitamins: Secondary | ICD-10-CM | POA: Diagnosis present

## 2019-03-15 DIAGNOSIS — U071 COVID-19: Secondary | ICD-10-CM | POA: Diagnosis not present

## 2019-03-15 DIAGNOSIS — D509 Iron deficiency anemia, unspecified: Secondary | ICD-10-CM | POA: Diagnosis present

## 2019-03-15 DIAGNOSIS — Z8612 Personal history of poliomyelitis: Secondary | ICD-10-CM | POA: Diagnosis not present

## 2019-03-15 DIAGNOSIS — J449 Chronic obstructive pulmonary disease, unspecified: Secondary | ICD-10-CM | POA: Diagnosis present

## 2019-03-15 DIAGNOSIS — I10 Essential (primary) hypertension: Secondary | ICD-10-CM | POA: Diagnosis present

## 2019-03-15 DIAGNOSIS — N401 Enlarged prostate with lower urinary tract symptoms: Secondary | ICD-10-CM | POA: Diagnosis present

## 2019-03-15 DIAGNOSIS — R630 Anorexia: Secondary | ICD-10-CM | POA: Diagnosis not present

## 2019-03-15 DIAGNOSIS — R41841 Cognitive communication deficit: Secondary | ICD-10-CM | POA: Diagnosis present

## 2019-03-15 DIAGNOSIS — Z9889 Other specified postprocedural states: Secondary | ICD-10-CM | POA: Diagnosis present

## 2019-03-15 DIAGNOSIS — F339 Major depressive disorder, recurrent, unspecified: Secondary | ICD-10-CM | POA: Diagnosis present

## 2019-03-15 DIAGNOSIS — H3552 Pigmentary retinal dystrophy: Secondary | ICD-10-CM | POA: Diagnosis present

## 2019-03-15 DIAGNOSIS — E559 Vitamin D deficiency, unspecified: Secondary | ICD-10-CM | POA: Diagnosis present

## 2019-03-15 DIAGNOSIS — D72829 Elevated white blood cell count, unspecified: Secondary | ICD-10-CM | POA: Diagnosis not present

## 2019-03-15 DIAGNOSIS — R1909 Other intra-abdominal and pelvic swelling, mass and lump: Secondary | ICD-10-CM | POA: Diagnosis present

## 2019-03-15 DIAGNOSIS — M6281 Muscle weakness (generalized): Secondary | ICD-10-CM | POA: Diagnosis present

## 2019-03-15 DIAGNOSIS — M4800 Spinal stenosis, site unspecified: Secondary | ICD-10-CM | POA: Diagnosis present

## 2019-03-26 DIAGNOSIS — D72829 Elevated white blood cell count, unspecified: Secondary | ICD-10-CM | POA: Diagnosis not present

## 2019-03-26 DIAGNOSIS — E162 Hypoglycemia, unspecified: Secondary | ICD-10-CM | POA: Diagnosis not present

## 2019-03-26 DIAGNOSIS — U071 COVID-19: Secondary | ICD-10-CM | POA: Diagnosis not present

## 2019-03-26 DIAGNOSIS — R630 Anorexia: Secondary | ICD-10-CM | POA: Diagnosis not present

## 2019-04-01 DIAGNOSIS — J449 Chronic obstructive pulmonary disease, unspecified: Secondary | ICD-10-CM | POA: Diagnosis not present

## 2019-04-01 DIAGNOSIS — R1312 Dysphagia, oropharyngeal phase: Secondary | ICD-10-CM | POA: Diagnosis not present

## 2019-04-02 DIAGNOSIS — D649 Anemia, unspecified: Secondary | ICD-10-CM | POA: Diagnosis not present

## 2019-04-02 DIAGNOSIS — D509 Iron deficiency anemia, unspecified: Secondary | ICD-10-CM | POA: Diagnosis not present

## 2019-04-02 DIAGNOSIS — R1312 Dysphagia, oropharyngeal phase: Secondary | ICD-10-CM | POA: Diagnosis not present

## 2019-04-02 DIAGNOSIS — N184 Chronic kidney disease, stage 4 (severe): Secondary | ICD-10-CM | POA: Diagnosis not present

## 2019-04-02 DIAGNOSIS — J449 Chronic obstructive pulmonary disease, unspecified: Secondary | ICD-10-CM | POA: Diagnosis not present

## 2019-04-03 DIAGNOSIS — R1312 Dysphagia, oropharyngeal phase: Secondary | ICD-10-CM | POA: Diagnosis not present

## 2019-04-03 DIAGNOSIS — J449 Chronic obstructive pulmonary disease, unspecified: Secondary | ICD-10-CM | POA: Diagnosis not present

## 2019-04-06 DIAGNOSIS — R1312 Dysphagia, oropharyngeal phase: Secondary | ICD-10-CM | POA: Diagnosis not present

## 2019-04-06 DIAGNOSIS — J449 Chronic obstructive pulmonary disease, unspecified: Secondary | ICD-10-CM | POA: Diagnosis not present

## 2019-04-07 DIAGNOSIS — R1312 Dysphagia, oropharyngeal phase: Secondary | ICD-10-CM | POA: Diagnosis not present

## 2019-04-07 DIAGNOSIS — J449 Chronic obstructive pulmonary disease, unspecified: Secondary | ICD-10-CM | POA: Diagnosis not present

## 2019-04-07 DIAGNOSIS — U071 COVID-19: Secondary | ICD-10-CM | POA: Diagnosis not present

## 2019-04-07 DIAGNOSIS — F0390 Unspecified dementia without behavioral disturbance: Secondary | ICD-10-CM | POA: Diagnosis not present

## 2019-04-08 DIAGNOSIS — R1312 Dysphagia, oropharyngeal phase: Secondary | ICD-10-CM | POA: Diagnosis not present

## 2019-04-08 DIAGNOSIS — J449 Chronic obstructive pulmonary disease, unspecified: Secondary | ICD-10-CM | POA: Diagnosis not present

## 2019-04-09 DIAGNOSIS — J449 Chronic obstructive pulmonary disease, unspecified: Secondary | ICD-10-CM | POA: Diagnosis not present

## 2019-04-09 DIAGNOSIS — R1312 Dysphagia, oropharyngeal phase: Secondary | ICD-10-CM | POA: Diagnosis not present

## 2019-04-10 DIAGNOSIS — R1312 Dysphagia, oropharyngeal phase: Secondary | ICD-10-CM | POA: Diagnosis not present

## 2019-04-10 DIAGNOSIS — J449 Chronic obstructive pulmonary disease, unspecified: Secondary | ICD-10-CM | POA: Diagnosis not present

## 2019-04-13 DIAGNOSIS — R1312 Dysphagia, oropharyngeal phase: Secondary | ICD-10-CM | POA: Diagnosis not present

## 2019-04-13 DIAGNOSIS — J449 Chronic obstructive pulmonary disease, unspecified: Secondary | ICD-10-CM | POA: Diagnosis not present

## 2019-04-14 DIAGNOSIS — J449 Chronic obstructive pulmonary disease, unspecified: Secondary | ICD-10-CM | POA: Diagnosis not present

## 2019-04-14 DIAGNOSIS — R1312 Dysphagia, oropharyngeal phase: Secondary | ICD-10-CM | POA: Diagnosis not present

## 2019-04-15 DIAGNOSIS — R1312 Dysphagia, oropharyngeal phase: Secondary | ICD-10-CM | POA: Diagnosis not present

## 2019-04-15 DIAGNOSIS — J449 Chronic obstructive pulmonary disease, unspecified: Secondary | ICD-10-CM | POA: Diagnosis not present

## 2019-04-16 DIAGNOSIS — R1312 Dysphagia, oropharyngeal phase: Secondary | ICD-10-CM | POA: Diagnosis not present

## 2019-04-16 DIAGNOSIS — J449 Chronic obstructive pulmonary disease, unspecified: Secondary | ICD-10-CM | POA: Diagnosis not present

## 2019-04-18 DIAGNOSIS — J449 Chronic obstructive pulmonary disease, unspecified: Secondary | ICD-10-CM | POA: Diagnosis not present

## 2019-04-18 DIAGNOSIS — R1312 Dysphagia, oropharyngeal phase: Secondary | ICD-10-CM | POA: Diagnosis not present

## 2019-04-20 DIAGNOSIS — J449 Chronic obstructive pulmonary disease, unspecified: Secondary | ICD-10-CM | POA: Diagnosis not present

## 2019-04-20 DIAGNOSIS — R1312 Dysphagia, oropharyngeal phase: Secondary | ICD-10-CM | POA: Diagnosis not present

## 2019-04-21 DIAGNOSIS — R1312 Dysphagia, oropharyngeal phase: Secondary | ICD-10-CM | POA: Diagnosis not present

## 2019-04-21 DIAGNOSIS — J449 Chronic obstructive pulmonary disease, unspecified: Secondary | ICD-10-CM | POA: Diagnosis not present

## 2019-04-21 DIAGNOSIS — Z23 Encounter for immunization: Secondary | ICD-10-CM | POA: Diagnosis not present

## 2019-04-22 DIAGNOSIS — J449 Chronic obstructive pulmonary disease, unspecified: Secondary | ICD-10-CM | POA: Diagnosis not present

## 2019-04-22 DIAGNOSIS — R1312 Dysphagia, oropharyngeal phase: Secondary | ICD-10-CM | POA: Diagnosis not present

## 2019-04-23 DIAGNOSIS — R1312 Dysphagia, oropharyngeal phase: Secondary | ICD-10-CM | POA: Diagnosis not present

## 2019-04-23 DIAGNOSIS — J449 Chronic obstructive pulmonary disease, unspecified: Secondary | ICD-10-CM | POA: Diagnosis not present

## 2019-04-24 DIAGNOSIS — J449 Chronic obstructive pulmonary disease, unspecified: Secondary | ICD-10-CM | POA: Diagnosis not present

## 2019-04-24 DIAGNOSIS — R1312 Dysphagia, oropharyngeal phase: Secondary | ICD-10-CM | POA: Diagnosis not present

## 2019-04-27 DIAGNOSIS — J449 Chronic obstructive pulmonary disease, unspecified: Secondary | ICD-10-CM | POA: Diagnosis not present

## 2019-04-27 DIAGNOSIS — R1312 Dysphagia, oropharyngeal phase: Secondary | ICD-10-CM | POA: Diagnosis not present

## 2019-04-28 DIAGNOSIS — R1312 Dysphagia, oropharyngeal phase: Secondary | ICD-10-CM | POA: Diagnosis not present

## 2019-04-28 DIAGNOSIS — J449 Chronic obstructive pulmonary disease, unspecified: Secondary | ICD-10-CM | POA: Diagnosis not present

## 2019-04-29 DIAGNOSIS — R1312 Dysphagia, oropharyngeal phase: Secondary | ICD-10-CM | POA: Diagnosis not present

## 2019-04-29 DIAGNOSIS — J449 Chronic obstructive pulmonary disease, unspecified: Secondary | ICD-10-CM | POA: Diagnosis not present

## 2019-04-30 DIAGNOSIS — R1312 Dysphagia, oropharyngeal phase: Secondary | ICD-10-CM | POA: Diagnosis not present

## 2019-04-30 DIAGNOSIS — J449 Chronic obstructive pulmonary disease, unspecified: Secondary | ICD-10-CM | POA: Diagnosis not present

## 2019-05-01 DIAGNOSIS — R1312 Dysphagia, oropharyngeal phase: Secondary | ICD-10-CM | POA: Diagnosis not present

## 2019-05-01 DIAGNOSIS — J449 Chronic obstructive pulmonary disease, unspecified: Secondary | ICD-10-CM | POA: Diagnosis not present

## 2019-05-14 DIAGNOSIS — B351 Tinea unguium: Secondary | ICD-10-CM | POA: Diagnosis not present

## 2019-05-14 DIAGNOSIS — M79674 Pain in right toe(s): Secondary | ICD-10-CM | POA: Diagnosis not present

## 2019-05-14 DIAGNOSIS — M79675 Pain in left toe(s): Secondary | ICD-10-CM | POA: Diagnosis not present

## 2019-06-05 DIAGNOSIS — J449 Chronic obstructive pulmonary disease, unspecified: Secondary | ICD-10-CM | POA: Diagnosis not present

## 2019-06-05 DIAGNOSIS — E039 Hypothyroidism, unspecified: Secondary | ICD-10-CM | POA: Diagnosis not present

## 2019-06-05 DIAGNOSIS — F0390 Unspecified dementia without behavioral disturbance: Secondary | ICD-10-CM | POA: Diagnosis not present

## 2019-06-05 DIAGNOSIS — E785 Hyperlipidemia, unspecified: Secondary | ICD-10-CM | POA: Diagnosis not present

## 2019-06-10 DIAGNOSIS — Z8612 Personal history of poliomyelitis: Secondary | ICD-10-CM | POA: Diagnosis not present

## 2019-06-10 DIAGNOSIS — M6281 Muscle weakness (generalized): Secondary | ICD-10-CM | POA: Diagnosis not present

## 2019-06-10 DIAGNOSIS — Z8616 Personal history of COVID-19: Secondary | ICD-10-CM | POA: Diagnosis not present

## 2019-06-10 DIAGNOSIS — R293 Abnormal posture: Secondary | ICD-10-CM | POA: Diagnosis not present

## 2019-06-11 DIAGNOSIS — M6281 Muscle weakness (generalized): Secondary | ICD-10-CM | POA: Diagnosis not present

## 2019-06-11 DIAGNOSIS — Z8616 Personal history of COVID-19: Secondary | ICD-10-CM | POA: Diagnosis not present

## 2019-06-11 DIAGNOSIS — R293 Abnormal posture: Secondary | ICD-10-CM | POA: Diagnosis not present

## 2019-06-11 DIAGNOSIS — Z8612 Personal history of poliomyelitis: Secondary | ICD-10-CM | POA: Diagnosis not present

## 2019-06-12 DIAGNOSIS — Z8612 Personal history of poliomyelitis: Secondary | ICD-10-CM | POA: Diagnosis not present

## 2019-06-12 DIAGNOSIS — Z8616 Personal history of COVID-19: Secondary | ICD-10-CM | POA: Diagnosis not present

## 2019-06-12 DIAGNOSIS — M6281 Muscle weakness (generalized): Secondary | ICD-10-CM | POA: Diagnosis not present

## 2019-06-12 DIAGNOSIS — R293 Abnormal posture: Secondary | ICD-10-CM | POA: Diagnosis not present

## 2019-06-15 DIAGNOSIS — M6281 Muscle weakness (generalized): Secondary | ICD-10-CM | POA: Diagnosis not present

## 2019-06-15 DIAGNOSIS — R293 Abnormal posture: Secondary | ICD-10-CM | POA: Diagnosis not present

## 2019-06-15 DIAGNOSIS — Z8616 Personal history of COVID-19: Secondary | ICD-10-CM | POA: Diagnosis not present

## 2019-06-15 DIAGNOSIS — Z8612 Personal history of poliomyelitis: Secondary | ICD-10-CM | POA: Diagnosis not present

## 2019-06-16 DIAGNOSIS — Z8616 Personal history of COVID-19: Secondary | ICD-10-CM | POA: Diagnosis not present

## 2019-06-16 DIAGNOSIS — Z8612 Personal history of poliomyelitis: Secondary | ICD-10-CM | POA: Diagnosis not present

## 2019-06-16 DIAGNOSIS — M6281 Muscle weakness (generalized): Secondary | ICD-10-CM | POA: Diagnosis not present

## 2019-06-16 DIAGNOSIS — R293 Abnormal posture: Secondary | ICD-10-CM | POA: Diagnosis not present

## 2019-06-17 DIAGNOSIS — Z8616 Personal history of COVID-19: Secondary | ICD-10-CM | POA: Diagnosis not present

## 2019-06-17 DIAGNOSIS — R293 Abnormal posture: Secondary | ICD-10-CM | POA: Diagnosis not present

## 2019-06-17 DIAGNOSIS — Z8612 Personal history of poliomyelitis: Secondary | ICD-10-CM | POA: Diagnosis not present

## 2019-06-17 DIAGNOSIS — M6281 Muscle weakness (generalized): Secondary | ICD-10-CM | POA: Diagnosis not present

## 2019-06-18 DIAGNOSIS — Z8616 Personal history of COVID-19: Secondary | ICD-10-CM | POA: Diagnosis not present

## 2019-06-18 DIAGNOSIS — M6281 Muscle weakness (generalized): Secondary | ICD-10-CM | POA: Diagnosis not present

## 2019-06-18 DIAGNOSIS — R293 Abnormal posture: Secondary | ICD-10-CM | POA: Diagnosis not present

## 2019-06-18 DIAGNOSIS — Z8612 Personal history of poliomyelitis: Secondary | ICD-10-CM | POA: Diagnosis not present

## 2019-06-19 DIAGNOSIS — Z8612 Personal history of poliomyelitis: Secondary | ICD-10-CM | POA: Diagnosis not present

## 2019-06-19 DIAGNOSIS — Z8616 Personal history of COVID-19: Secondary | ICD-10-CM | POA: Diagnosis not present

## 2019-06-19 DIAGNOSIS — M6281 Muscle weakness (generalized): Secondary | ICD-10-CM | POA: Diagnosis not present

## 2019-06-19 DIAGNOSIS — R293 Abnormal posture: Secondary | ICD-10-CM | POA: Diagnosis not present

## 2019-06-22 DIAGNOSIS — E785 Hyperlipidemia, unspecified: Secondary | ICD-10-CM | POA: Diagnosis not present

## 2019-06-22 DIAGNOSIS — D509 Iron deficiency anemia, unspecified: Secondary | ICD-10-CM | POA: Diagnosis not present

## 2019-06-22 DIAGNOSIS — R293 Abnormal posture: Secondary | ICD-10-CM | POA: Diagnosis not present

## 2019-06-22 DIAGNOSIS — E039 Hypothyroidism, unspecified: Secondary | ICD-10-CM | POA: Diagnosis not present

## 2019-06-22 DIAGNOSIS — N184 Chronic kidney disease, stage 4 (severe): Secondary | ICD-10-CM | POA: Diagnosis not present

## 2019-06-22 DIAGNOSIS — Z8612 Personal history of poliomyelitis: Secondary | ICD-10-CM | POA: Diagnosis not present

## 2019-06-22 DIAGNOSIS — D649 Anemia, unspecified: Secondary | ICD-10-CM | POA: Diagnosis not present

## 2019-06-22 DIAGNOSIS — M6281 Muscle weakness (generalized): Secondary | ICD-10-CM | POA: Diagnosis not present

## 2019-06-22 DIAGNOSIS — Z8616 Personal history of COVID-19: Secondary | ICD-10-CM | POA: Diagnosis not present

## 2019-06-22 DIAGNOSIS — E782 Mixed hyperlipidemia: Secondary | ICD-10-CM | POA: Diagnosis not present

## 2019-06-23 DIAGNOSIS — Z79899 Other long term (current) drug therapy: Secondary | ICD-10-CM | POA: Diagnosis not present

## 2019-06-23 DIAGNOSIS — R293 Abnormal posture: Secondary | ICD-10-CM | POA: Diagnosis not present

## 2019-06-23 DIAGNOSIS — Z8612 Personal history of poliomyelitis: Secondary | ICD-10-CM | POA: Diagnosis not present

## 2019-06-23 DIAGNOSIS — M6281 Muscle weakness (generalized): Secondary | ICD-10-CM | POA: Diagnosis not present

## 2019-06-23 DIAGNOSIS — E039 Hypothyroidism, unspecified: Secondary | ICD-10-CM | POA: Diagnosis not present

## 2019-06-23 DIAGNOSIS — Z8616 Personal history of COVID-19: Secondary | ICD-10-CM | POA: Diagnosis not present

## 2019-06-24 DIAGNOSIS — M6281 Muscle weakness (generalized): Secondary | ICD-10-CM | POA: Diagnosis not present

## 2019-06-24 DIAGNOSIS — R293 Abnormal posture: Secondary | ICD-10-CM | POA: Diagnosis not present

## 2019-06-24 DIAGNOSIS — Z8612 Personal history of poliomyelitis: Secondary | ICD-10-CM | POA: Diagnosis not present

## 2019-06-24 DIAGNOSIS — Z8616 Personal history of COVID-19: Secondary | ICD-10-CM | POA: Diagnosis not present

## 2019-06-25 DIAGNOSIS — M6281 Muscle weakness (generalized): Secondary | ICD-10-CM | POA: Diagnosis not present

## 2019-06-25 DIAGNOSIS — R293 Abnormal posture: Secondary | ICD-10-CM | POA: Diagnosis not present

## 2019-06-25 DIAGNOSIS — Z8616 Personal history of COVID-19: Secondary | ICD-10-CM | POA: Diagnosis not present

## 2019-06-25 DIAGNOSIS — Z8612 Personal history of poliomyelitis: Secondary | ICD-10-CM | POA: Diagnosis not present

## 2019-06-26 DIAGNOSIS — Z8612 Personal history of poliomyelitis: Secondary | ICD-10-CM | POA: Diagnosis not present

## 2019-06-26 DIAGNOSIS — Z8616 Personal history of COVID-19: Secondary | ICD-10-CM | POA: Diagnosis not present

## 2019-06-26 DIAGNOSIS — M6281 Muscle weakness (generalized): Secondary | ICD-10-CM | POA: Diagnosis not present

## 2019-06-26 DIAGNOSIS — R293 Abnormal posture: Secondary | ICD-10-CM | POA: Diagnosis not present

## 2019-06-29 DIAGNOSIS — Z8616 Personal history of COVID-19: Secondary | ICD-10-CM | POA: Diagnosis not present

## 2019-06-29 DIAGNOSIS — R293 Abnormal posture: Secondary | ICD-10-CM | POA: Diagnosis not present

## 2019-06-29 DIAGNOSIS — M6281 Muscle weakness (generalized): Secondary | ICD-10-CM | POA: Diagnosis not present

## 2019-06-29 DIAGNOSIS — Z8612 Personal history of poliomyelitis: Secondary | ICD-10-CM | POA: Diagnosis not present

## 2019-06-30 DIAGNOSIS — R293 Abnormal posture: Secondary | ICD-10-CM | POA: Diagnosis not present

## 2019-06-30 DIAGNOSIS — M6281 Muscle weakness (generalized): Secondary | ICD-10-CM | POA: Diagnosis not present

## 2019-06-30 DIAGNOSIS — Z8612 Personal history of poliomyelitis: Secondary | ICD-10-CM | POA: Diagnosis not present

## 2019-06-30 DIAGNOSIS — Z8616 Personal history of COVID-19: Secondary | ICD-10-CM | POA: Diagnosis not present

## 2019-07-01 DIAGNOSIS — Z8612 Personal history of poliomyelitis: Secondary | ICD-10-CM | POA: Diagnosis not present

## 2019-07-01 DIAGNOSIS — Z8616 Personal history of COVID-19: Secondary | ICD-10-CM | POA: Diagnosis not present

## 2019-07-01 DIAGNOSIS — R293 Abnormal posture: Secondary | ICD-10-CM | POA: Diagnosis not present

## 2019-07-01 DIAGNOSIS — M6281 Muscle weakness (generalized): Secondary | ICD-10-CM | POA: Diagnosis not present

## 2019-07-02 DIAGNOSIS — Z8612 Personal history of poliomyelitis: Secondary | ICD-10-CM | POA: Diagnosis not present

## 2019-07-02 DIAGNOSIS — M6281 Muscle weakness (generalized): Secondary | ICD-10-CM | POA: Diagnosis not present

## 2019-07-02 DIAGNOSIS — D649 Anemia, unspecified: Secondary | ICD-10-CM | POA: Diagnosis not present

## 2019-07-02 DIAGNOSIS — R293 Abnormal posture: Secondary | ICD-10-CM | POA: Diagnosis not present

## 2019-07-02 DIAGNOSIS — N186 End stage renal disease: Secondary | ICD-10-CM | POA: Diagnosis not present

## 2019-07-02 DIAGNOSIS — Z8616 Personal history of COVID-19: Secondary | ICD-10-CM | POA: Diagnosis not present

## 2019-07-03 ENCOUNTER — Emergency Department (HOSPITAL_COMMUNITY)
Admission: EM | Admit: 2019-07-03 | Discharge: 2019-07-03 | Disposition: A | Discharge status: Home / Self Care | Payer: Medicare Other | Attending: Emergency Medicine | Admitting: Emergency Medicine

## 2019-07-03 ENCOUNTER — Other Ambulatory Visit: Payer: Self-pay

## 2019-07-03 DIAGNOSIS — Z8616 Personal history of COVID-19: Secondary | ICD-10-CM | POA: Diagnosis not present

## 2019-07-03 DIAGNOSIS — R899 Unspecified abnormal finding in specimens from other organs, systems and tissues: Secondary | ICD-10-CM

## 2019-07-03 DIAGNOSIS — Z743 Need for continuous supervision: Secondary | ICD-10-CM | POA: Diagnosis not present

## 2019-07-03 DIAGNOSIS — I959 Hypotension, unspecified: Secondary | ICD-10-CM | POA: Diagnosis not present

## 2019-07-03 DIAGNOSIS — Z8612 Personal history of poliomyelitis: Secondary | ICD-10-CM | POA: Diagnosis not present

## 2019-07-03 DIAGNOSIS — E871 Hypo-osmolality and hyponatremia: Secondary | ICD-10-CM | POA: Diagnosis not present

## 2019-07-03 DIAGNOSIS — Z96643 Presence of artificial hip joint, bilateral: Secondary | ICD-10-CM | POA: Insufficient documentation

## 2019-07-03 DIAGNOSIS — J449 Chronic obstructive pulmonary disease, unspecified: Secondary | ICD-10-CM | POA: Diagnosis not present

## 2019-07-03 DIAGNOSIS — R404 Transient alteration of awareness: Secondary | ICD-10-CM | POA: Diagnosis not present

## 2019-07-03 DIAGNOSIS — Z7401 Bed confinement status: Secondary | ICD-10-CM | POA: Diagnosis not present

## 2019-07-03 DIAGNOSIS — M6281 Muscle weakness (generalized): Secondary | ICD-10-CM | POA: Diagnosis not present

## 2019-07-03 DIAGNOSIS — R293 Abnormal posture: Secondary | ICD-10-CM | POA: Diagnosis not present

## 2019-07-03 DIAGNOSIS — E039 Hypothyroidism, unspecified: Secondary | ICD-10-CM | POA: Diagnosis not present

## 2019-07-03 DIAGNOSIS — R7989 Other specified abnormal findings of blood chemistry: Secondary | ICD-10-CM | POA: Diagnosis present

## 2019-07-03 DIAGNOSIS — R456 Violent behavior: Secondary | ICD-10-CM | POA: Diagnosis not present

## 2019-07-03 DIAGNOSIS — R799 Abnormal finding of blood chemistry, unspecified: Secondary | ICD-10-CM | POA: Diagnosis not present

## 2019-07-03 LAB — CBC
HCT: 36.2 % — ABNORMAL LOW (ref 39.0–52.0)
Hemoglobin: 11.5 g/dL — ABNORMAL LOW (ref 13.0–17.0)
MCH: 30.6 pg (ref 26.0–34.0)
MCHC: 31.8 g/dL (ref 30.0–36.0)
MCV: 96.3 fL (ref 80.0–100.0)
Platelets: 257 10*3/uL (ref 150–400)
RBC: 3.76 MIL/uL — ABNORMAL LOW (ref 4.22–5.81)
RDW: 12.7 % (ref 11.5–15.5)
WBC: 6 10*3/uL (ref 4.0–10.5)
nRBC: 0 % (ref 0.0–0.2)

## 2019-07-03 LAB — COMPREHENSIVE METABOLIC PANEL
ALT: 13 U/L (ref 0–44)
AST: 22 U/L (ref 15–41)
Albumin: 3.6 g/dL (ref 3.5–5.0)
Alkaline Phosphatase: 69 U/L (ref 38–126)
Anion gap: 9 (ref 5–15)
BUN: 19 mg/dL (ref 8–23)
CO2: 25 mmol/L (ref 22–32)
Calcium: 8.7 mg/dL — ABNORMAL LOW (ref 8.9–10.3)
Chloride: 100 mmol/L (ref 98–111)
Creatinine, Ser: 0.51 mg/dL — ABNORMAL LOW (ref 0.61–1.24)
GFR calc Af Amer: >60 mL/min (ref >60)
GFR calc non Af Amer: >60 mL/min (ref >60)
Glucose, Bld: 90 mg/dL (ref 70–99)
Potassium: 4.5 mmol/L (ref 3.5–5.1)
Sodium: 134 mmol/L — ABNORMAL LOW (ref 135–145)
Total Bilirubin: 0.8 mg/dL (ref 0.3–1.2)
Total Protein: 6.8 g/dL (ref 6.5–8.1)

## 2019-07-03 LAB — LACTIC ACID, PLASMA: Lactic Acid, Venous: 1.2 mmol/L (ref 0.5–1.9)

## 2019-07-03 MED ORDER — SODIUM CHLORIDE 0.9 % IV BOLUS
500.0000 mL | Freq: Once | INTRAVENOUS | Status: AC
  Administered 2019-07-03: 12:00:00 500 mL via INTRAVENOUS

## 2019-07-03 NOTE — ED Notes (Signed)
Dc paperwork and info given to ems to bring pt back to facility, reviewed d/c with brother, he verbalized understanding, report to ems, pt unable to sign for d/c pt from dpt via ems

## 2019-07-03 NOTE — ED Notes (Signed)
Pt in bed, brother at bedside, brother states that pt is at baseline, states that he had a cognitive injury at 1, states that pt is also hard of hearing.  Pt is verbal, pt asked for food, pt not answering questions, pt calm and cooperative.  Bed rails up call bell within reach, brother at bedside.  States that pt is non ambulatory, pt had depends in place, pt has some redness and skin breakdown to groin.

## 2019-07-03 NOTE — ED Provider Notes (Signed)
[facility] Emergency Department Provider Note MRN:  [id number]  Arrival date & time: 07/03/19     Chief Complaint   Abnormal lab History of Present Illness   Alexander Hanna is a 79 y.o. year-old male with a history of cognitive impairment COPD, hyperlipidemia presenting to the ED with chief complaint of abnormal lab.  Patient is sent here from [facility] for downtrending sodium, reportedly at 124.  Brother is at bedside and notes that patient is at his baseline mental status.  Had trauma to the brain as a child, has had cognitive impairment since then.  Worsening mental status over the past several years, thought to be due to dementia.  No recent fever or illness.  I was unable to obtain an accurate HPI, PMH, or ROS due to the patient's cognitive impairment.  Level 5 caveat.  Review of Systems  Positive for low sodium.  Patient's Health History    Past Medical History:  Diagnosis Date  . Allergic rhinitis   . Anterior dislocation of left hip ([place])   . Anxiety   . Benign prostatic hyperplasia   . Constipation   . COPD (chronic obstructive pulmonary disease) ([place])   . Depression   . Dysphagia   . Encephalopathy   . Environmental allergies   . GERD (gastroesophageal reflux disease)   . Headache    brain damaged at age 47   . Hyperlipidemia   . Hypokalemia   . Hypothyroidism   . Incontinent of urine    wears a diaper per brother   . Iron deficiency anemia   . 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 Neuromuscular dysfunction of bladder   . Overactive bladder   . Polio    as a child  . Presence of artificial hip, left   . Protein calorie malnutrition ([place])   . Urinary retention   . Vitamin D deficiency     Past Surgical History:  Procedure Laterality Date  . CATARACT EXTRACTION W/PHACO  12/20/2011   Procedure: CATARACT EXTRACTION PHACO AND INTRAOCULAR LENS PLACEMENT (IOC);  Surgeon: Tonny Branch, MD;  Location: [facility] ORS;  Service:  Ophthalmology;  Laterality: Left;  CDE: 14.80  . CATARACT EXTRACTION W/PHACO  01/14/2012   Procedure: CATARACT EXTRACTION PHACO AND INTRAOCULAR LENS PLACEMENT (IOC);  Surgeon: Tonny Branch, MD;  Location: [facility];  Service: Ophthalmology;  Laterality: Right;  CDE 19.57  . CHOLECYSTECTOMY    . ESOPHAGOGASTRODUODENOSCOPY N/A 01/14/2014   Dr. Rourk:non-critical schatzki ring, not manipulated/small HHotherwise normal. 20 F PEG tube placed   . EXCISIONAL TOTAL HIP ARTHROPLASTY WITH ANTIBIOTIC SPACERS Left 04/02/2016   Procedure: Resection left hip hemiarthroplasty- [place];  Surgeon: Paralee Cancel, MD;  Location: [facility] ORS;  Service: Orthopedics;  Laterality: Left;  . JOINT REPLACEMENT     rt anterior hip  . JOINT REPLACEMENT     Left hip  . PEG PLACEMENT N/A 01/14/2014   Procedure: PERCUTANEOUS ENDOSCOPIC GASTROSTOMY (PEG) PLACEMENT;  Surgeon: Daneil Dolin, MD;  Location: [facility] SU[facility];  Service: Endoscopy;  Laterality: N/A;  . PERCUTANEOUS ENDOSCOPIC GASTROSTOMY (PEG) REMOVAL N/A 03/10/2015   Procedure: PERCUTANEOUS ENDOSCOPIC GASTROSTOMY (PEG) REMOVAL;  Surgeon: Daneil Dolin, MD;  Location: [facility];  Service: Endoscopy;  Laterality: N/A;  1115  . WOUND DEBRIDEMENT Left    Post hip replacement    Family History  Problem Relation Age of Onset  . Lung cancer Mother   . Heart attack Father   .  Angina Brother   . Coronary artery disease Brother     Social History   Socioeconomic History  . Marital status: Single    Spouse name: Not on file  . Number of children: Not on file  . Years of education: Not on file  . Highest education level: Not on file  Occupational History  . Occupation: Disabled  Tobacco Use  . Smoking status: Never Smoker  . Smokeless tobacco: Never Used  . Tobacco comment: Never smoked much  Substance and Sexual Activity  . Alcohol use: No  . Drug use: No  . Sexual activity: Not Currently  Other Topics Concern  . Not on file  Social History Narrative  .  Not on file   Social Determinants of Health   Financial Resource Strain:   . Difficulty of Paying Living Expenses:   Food Insecurity:   . Worried About Charity fundraiser in the Last Year:   . Arboriculturist in the Last Year:   Transportation Needs:   . Film/video editor (Medical):   Marland Kitchen Lack of Transportation (Non-Medical):   Physical Activity:   . Days of Exercise per Week:   . Minutes of Exercise per Session:   Stress:   . Feeling of Stress :   Social Connections:   . Frequency of Communication with Friends and Family:   . Frequency of Social Gatherings with Friends and Family:   . Attends Religious Services:   . Active Member of Clubs or Organizations:   . Attends Archivist Meetings:   Marland Kitchen Marital Status:   Intimate Partner Violence:   . Fear of Current or Ex-Partner:   . Emotionally Abused:   Marland Kitchen Physically Abused:   . Sexually Abused:      Physical Exam   Vitals:   07/03/19 1150  BP: 98/63  Pulse: 65  Resp: 17  Temp: (!) 96.8 F (36 C)  SpO2: 99%    CONSTITUTIONAL: Chronically ill-appearing, NAD NEURO: Awake, largely nonverbal, will say incoherent words occasionally, moves all extremities EYES:  eyes equal and reactive ENT/NECK:  no LAD, no JVD CARDIO: Regular rate, well-perfused, normal S1 and S2 PULM:  CTAB no wheezing or rhonchi GI/GU:  normal bowel sounds, non-distended, non-tender MSK/SPINE:  No gross deformities, no edema SKIN:  no rash, atraumatic PSYCH:  Appropriate speech and behavior  *Additional and/or pertinent findings included in MDM below  Diagnostic and Interventional Summary    EKG Interpretation  Date/Time:  Friday July 03 2019 12:18:25 EDT Ventricular Rate:  61 PR Interval:    QRS Duration: 91 QT Interval:  458 QTC Calculation: 462 R Axis:   11 Text Interpretation: Sinus rhythm Borderline T wave abnormalities Confirmed by Gerlene Fee [phone]) on 07/03/2019 1:27:25 PM      Labs Reviewed  CBC - Abnormal;  Notable for the following components:      Result Value   RBC 3.76 (*)    Hemoglobin 11.5 (*)    HCT 36.2 (*)    All other components within normal limits  COMPREHENSIVE METABOLIC PANEL - Abnormal; Notable for the following components:   Sodium 134 (*)    Creatinine, Ser 0.51 (*)    Calcium 8.7 (*)    All other components within normal limits  LACTIC ACID, PLASMA    No orders to display    Medications  sodium chloride 0.9 % bolus 500 mL (500 mLs Intravenous New Bag/Given 07/03/19 1228)     Procedures  /  Critical Care Procedures  ED Course and Medical Decision Making  I have reviewed the triage vital signs, the nursing notes, and pertinent available records from the EMR.  Listed above are laboratory and imaging tests that I personally ordered, reviewed, and interpreted and then considered in my medical decision making (see below for details).      Hyponatremia, unclear cause at this time, likely related to hydration status.  Does not appear to be on any diuretics.  Awaiting labs, anticipating need for admission if he is truly near 124.  Labs reveal a sodium of 134, other values are reassuring, lactate normal.  EKG reassuring.  Given that low sodium was the only reason for his transfer to the emergency department and the fact that patient is at his baseline mental status, I see no indication for further testing or admission.  Patient is appropriate for return to [place].  This plan discussed with patient's brother at bedside who agrees with the plan.  Barth Kirks. Sedonia Small, [place] [email or web address]  Final Clinical Impressions(s) / ED Diagnoses     ICD-10-CM   1. Abnormal laboratory test result  R89.9     ED Discharge Orders    None       Discharge Instructions Discussed with and Provided to Patient:     Discharge Instructions     You were evaluated in the Emergency Department and after careful evaluation, we did  not find any emergent condition requiring admission or further testing in the hospital.  Your exam/testing today was overall reassuring.  Your sodium level here in the emergency department is 134, which is reassuring.  Please return to the Emergency Department if you experience any worsening of your condition.  We encourage you to follow up with a primary care provider.  Thank you for allowing [place] to be a part of your care.        Maudie Flakes, MD 07/03/19 1328

## 2019-07-03 NOTE — ED Triage Notes (Signed)
Pt to er via ems, per ems and facility pt is here for a low sodium, states that 10 days ago it was 136 and it has slowly trended to 129, states that pt is hard of hearing, has a cognitive disorder and is confused.  Pt presents in gown, pt eyes open looking around room, pt not answering questions.  Pt able to move all extremities.

## 2019-07-03 NOTE — ED Notes (Signed)
Pt had small brown soft bm, cleaned pt and changed depends, pt has some skin breakdown to groin and buttocks, mepilex placed on sacrum.  Report called to facility on third attempt.

## 2019-07-03 NOTE — Discharge Instructions (Addendum)
You were evaluated in the Emergency Department and after careful evaluation, we did not find any emergent condition requiring admission or further testing in the hospital.  Your exam/testing today was overall reassuring.  Your sodium level here in the emergency department is 134, which is reassuring.  Please return to the Emergency Department if you experience any worsening of your condition.  We encourage you to follow up with a primary care provider.  Thank you for allowing [place] to be a part of your care.

## 2019-07-06 DIAGNOSIS — Z8616 Personal history of COVID-19: Secondary | ICD-10-CM | POA: Diagnosis not present

## 2019-07-06 DIAGNOSIS — M6281 Muscle weakness (generalized): Secondary | ICD-10-CM | POA: Diagnosis not present

## 2019-07-06 DIAGNOSIS — Z8612 Personal history of poliomyelitis: Secondary | ICD-10-CM | POA: Diagnosis not present

## 2019-07-06 DIAGNOSIS — R293 Abnormal posture: Secondary | ICD-10-CM | POA: Diagnosis not present

## 2019-07-07 DIAGNOSIS — E871 Hypo-osmolality and hyponatremia: Secondary | ICD-10-CM | POA: Diagnosis not present

## 2019-07-07 DIAGNOSIS — R293 Abnormal posture: Secondary | ICD-10-CM | POA: Diagnosis not present

## 2019-07-07 DIAGNOSIS — M6281 Muscle weakness (generalized): Secondary | ICD-10-CM | POA: Diagnosis not present

## 2019-07-07 DIAGNOSIS — D649 Anemia, unspecified: Secondary | ICD-10-CM | POA: Diagnosis not present

## 2019-07-07 DIAGNOSIS — Z8616 Personal history of COVID-19: Secondary | ICD-10-CM | POA: Diagnosis not present

## 2019-07-07 DIAGNOSIS — Z8612 Personal history of poliomyelitis: Secondary | ICD-10-CM | POA: Diagnosis not present

## 2019-07-08 DIAGNOSIS — Z8616 Personal history of COVID-19: Secondary | ICD-10-CM | POA: Diagnosis not present

## 2019-07-08 DIAGNOSIS — R293 Abnormal posture: Secondary | ICD-10-CM | POA: Diagnosis not present

## 2019-07-08 DIAGNOSIS — Z8612 Personal history of poliomyelitis: Secondary | ICD-10-CM | POA: Diagnosis not present

## 2019-07-08 DIAGNOSIS — M6281 Muscle weakness (generalized): Secondary | ICD-10-CM | POA: Diagnosis not present

## 2019-07-09 DIAGNOSIS — R293 Abnormal posture: Secondary | ICD-10-CM | POA: Diagnosis not present

## 2019-07-09 DIAGNOSIS — M6281 Muscle weakness (generalized): Secondary | ICD-10-CM | POA: Diagnosis not present

## 2019-07-09 DIAGNOSIS — Z8612 Personal history of poliomyelitis: Secondary | ICD-10-CM | POA: Diagnosis not present

## 2019-07-09 DIAGNOSIS — Z8616 Personal history of COVID-19: Secondary | ICD-10-CM | POA: Diagnosis not present

## 2019-07-17 DIAGNOSIS — E871 Hypo-osmolality and hyponatremia: Secondary | ICD-10-CM | POA: Diagnosis not present

## 2019-07-22 DIAGNOSIS — M79674 Pain in right toe(s): Secondary | ICD-10-CM | POA: Diagnosis not present

## 2019-07-22 DIAGNOSIS — B351 Tinea unguium: Secondary | ICD-10-CM | POA: Diagnosis not present

## 2019-07-28 DIAGNOSIS — E785 Hyperlipidemia, unspecified: Secondary | ICD-10-CM | POA: Diagnosis not present

## 2019-07-28 DIAGNOSIS — R52 Pain, unspecified: Secondary | ICD-10-CM | POA: Diagnosis not present

## 2019-07-28 DIAGNOSIS — E039 Hypothyroidism, unspecified: Secondary | ICD-10-CM | POA: Diagnosis not present

## 2019-07-30 DIAGNOSIS — R319 Hematuria, unspecified: Secondary | ICD-10-CM | POA: Diagnosis not present

## 2019-07-30 DIAGNOSIS — N184 Chronic kidney disease, stage 4 (severe): Secondary | ICD-10-CM | POA: Diagnosis not present

## 2019-07-30 DIAGNOSIS — E039 Hypothyroidism, unspecified: Secondary | ICD-10-CM | POA: Diagnosis not present

## 2019-07-30 DIAGNOSIS — R4182 Altered mental status, unspecified: Secondary | ICD-10-CM | POA: Diagnosis not present

## 2019-07-30 DIAGNOSIS — N390 Urinary tract infection, site not specified: Secondary | ICD-10-CM | POA: Diagnosis not present

## 2019-07-30 DIAGNOSIS — D649 Anemia, unspecified: Secondary | ICD-10-CM | POA: Diagnosis not present

## 2019-07-31 DIAGNOSIS — E039 Hypothyroidism, unspecified: Secondary | ICD-10-CM | POA: Diagnosis not present

## 2019-08-03 DIAGNOSIS — R4182 Altered mental status, unspecified: Secondary | ICD-10-CM | POA: Diagnosis not present

## 2019-08-03 DIAGNOSIS — N390 Urinary tract infection, site not specified: Secondary | ICD-10-CM | POA: Diagnosis not present

## 2019-08-03 DIAGNOSIS — R319 Hematuria, unspecified: Secondary | ICD-10-CM | POA: Diagnosis not present

## 2020-01-19 DIAGNOSIS — E039 Hypothyroidism, unspecified: Secondary | ICD-10-CM | POA: Diagnosis not present

## 2020-01-19 DIAGNOSIS — R252 Cramp and spasm: Secondary | ICD-10-CM | POA: Diagnosis not present

## 2020-01-19 DIAGNOSIS — F0390 Unspecified dementia without behavioral disturbance: Secondary | ICD-10-CM | POA: Diagnosis not present

## 2020-02-17 DEATH — deceased
